# Patient Record
Sex: Male | Born: 1950
Health system: Southern US, Community
[De-identification: ages and names within clinical notes are randomized; demographics above are authoritative.]

## PROBLEM LIST (undated history)

## (undated) DIAGNOSIS — F419 Anxiety disorder, unspecified: Secondary | ICD-10-CM

## (undated) DIAGNOSIS — C801 Malignant (primary) neoplasm, unspecified: Secondary | ICD-10-CM

## (undated) DIAGNOSIS — M1990 Unspecified osteoarthritis, unspecified site: Secondary | ICD-10-CM

## (undated) DIAGNOSIS — F329 Major depressive disorder, single episode, unspecified: Secondary | ICD-10-CM

## (undated) DIAGNOSIS — J342 Deviated nasal septum: Secondary | ICD-10-CM

## (undated) DIAGNOSIS — I10 Essential (primary) hypertension: Secondary | ICD-10-CM

## (undated) DIAGNOSIS — F32A Depression, unspecified: Secondary | ICD-10-CM

## (undated) DIAGNOSIS — J343 Hypertrophy of nasal turbinates: Secondary | ICD-10-CM

## (undated) HISTORY — DX: Essential (primary) hypertension: I10

## (undated) HISTORY — DX: Unspecified osteoarthritis, unspecified site: M19.90

## (undated) HISTORY — PX: PROSTATE SURGERY: SHX751

## (undated) HISTORY — PX: INGUINAL HERNIA REPAIR: SHX194

## (undated) HISTORY — DX: Depression, unspecified: F32.A

## (undated) HISTORY — DX: Major depressive disorder, single episode, unspecified: F32.9

## (undated) HISTORY — DX: Malignant (primary) neoplasm, unspecified: C80.1

## (undated) HISTORY — DX: Anxiety disorder, unspecified: F41.9

---

## 1997-07-20 ENCOUNTER — Ambulatory Visit (HOSPITAL_COMMUNITY): Admission: RE | Admit: 1997-07-20 | Discharge: 1997-07-20 | Payer: Self-pay

## 1997-07-21 ENCOUNTER — Encounter: Admission: RE | Admit: 1997-07-21 | Discharge: 1997-07-21 | Payer: Self-pay | Admitting: *Deleted

## 1998-02-10 HISTORY — PX: COLONOSCOPY: SHX174

## 1998-12-21 ENCOUNTER — Ambulatory Visit (HOSPITAL_COMMUNITY): Admission: RE | Admit: 1998-12-21 | Discharge: 1998-12-21 | Payer: Self-pay | Admitting: *Deleted

## 2009-02-10 DIAGNOSIS — C801 Malignant (primary) neoplasm, unspecified: Secondary | ICD-10-CM

## 2009-02-10 HISTORY — DX: Malignant (primary) neoplasm, unspecified: C80.1

## 2009-11-14 ENCOUNTER — Encounter (INDEPENDENT_AMBULATORY_CARE_PROVIDER_SITE_OTHER): Payer: Self-pay | Admitting: Urology

## 2009-11-14 ENCOUNTER — Inpatient Hospital Stay (HOSPITAL_COMMUNITY): Admission: RE | Admit: 2009-11-14 | Discharge: 2009-11-16 | Payer: Self-pay | Admitting: Urology

## 2010-04-25 LAB — CBC
HCT: 41.1 % (ref 39.0–52.0)
Hemoglobin: 14 g/dL (ref 13.0–17.0)
MCH: 30.7 pg (ref 26.0–34.0)
MCH: 30.5 pg (ref 26.0–34.0)
MCHC: 34.2 g/dL (ref 30.0–36.0)
MCHC: 34 g/dL (ref 30.0–36.0)
MCV: 89.8 fL (ref 78.0–100.0)
Platelets: 178 10*3/uL (ref 150–400)
Platelets: 250 10*3/uL (ref 150–400)
Platelets: 233 K/uL (ref 150–400)
RBC: 3.52 MIL/uL — ABNORMAL LOW (ref 4.22–5.81)
RBC: 4.51 MIL/uL (ref 4.22–5.81)
RBC: 4.57 MIL/uL (ref 4.22–5.81)
RDW: 15.2 % (ref 11.5–15.5)
WBC: 10.3 10*3/uL (ref 4.0–10.5)
WBC: 4.8 K/uL (ref 4.0–10.5)

## 2010-04-25 LAB — BASIC METABOLIC PANEL
BUN: 17 mg/dL (ref 6–23)
CO2: 27 mEq/L (ref 19–32)
CO2: 29 mEq/L (ref 19–32)
Calcium: 7.6 mg/dL — ABNORMAL LOW (ref 8.4–10.5)
Calcium: 7.7 mg/dL — ABNORMAL LOW (ref 8.4–10.5)
Calcium: 8.7 mg/dL (ref 8.4–10.5)
Calcium: 9.5 mg/dL (ref 8.4–10.5)
Chloride: 106 mEq/L (ref 96–112)
Chloride: 99 mEq/L (ref 96–112)
Creatinine, Ser: 1.75 mg/dL — ABNORMAL HIGH (ref 0.4–1.5)
Creatinine, Ser: 2.4 mg/dL — ABNORMAL HIGH (ref 0.4–1.5)
Creatinine, Ser: 1.52 mg/dL — ABNORMAL HIGH (ref 0.4–1.5)
GFR calc Af Amer: 33 mL/min — ABNORMAL LOW (ref >60)
GFR calc Af Amer: 34 mL/min — ABNORMAL LOW (ref >60)
GFR calc Af Amer: 48 mL/min — ABNORMAL LOW (ref >60)
GFR calc non Af Amer: 31 mL/min — ABNORMAL LOW (ref >60)
GFR calc non Af Amer: 40 mL/min — ABNORMAL LOW (ref >60)
GFR calc non Af Amer: 47 mL/min — ABNORMAL LOW (ref >60)
Glucose, Bld: 94 mg/dL (ref 70–99)
Potassium: 4.6 mEq/L (ref 3.5–5.1)
Sodium: 136 mEq/L (ref 135–145)
Sodium: 139 mEq/L (ref 135–145)
Sodium: 142 mEq/L (ref 135–145)

## 2010-04-25 LAB — CLOSTRIDIUM DIFFICILE BY PCR: Toxigenic C. Difficile by PCR: NOT DETECTED

## 2010-04-25 LAB — DIFFERENTIAL
Lymphocytes Relative: 5 % — ABNORMAL LOW (ref 12–46)
Lymphs Abs: 0.5 10*3/uL — ABNORMAL LOW (ref 0.7–4.0)
Neutrophils Relative %: 93 % — ABNORMAL HIGH (ref 43–77)

## 2010-04-25 LAB — TYPE AND SCREEN
ABO/RH(D): O POS
Antibody Screen: NEGATIVE

## 2010-04-25 LAB — HEMOGLOBIN AND HEMATOCRIT, BLOOD
HCT: 27.6 % — ABNORMAL LOW (ref 39.0–52.0)
HCT: 30.3 % — ABNORMAL LOW (ref 39.0–52.0)
Hemoglobin: 9.4 g/dL — ABNORMAL LOW (ref 13.0–17.0)

## 2010-04-25 LAB — SURGICAL PCR SCREEN
MRSA, PCR: NEGATIVE
Staphylococcus aureus: NEGATIVE

## 2011-06-14 ENCOUNTER — Other Ambulatory Visit: Payer: Self-pay | Admitting: Family Medicine

## 2011-10-27 ENCOUNTER — Ambulatory Visit (INDEPENDENT_AMBULATORY_CARE_PROVIDER_SITE_OTHER): Payer: 59 | Admitting: Physician Assistant

## 2011-10-27 VITALS — BP 124/82 | HR 97 | Temp 98.9°F | Resp 18 | Ht 72.5 in | Wt 250.0 lb

## 2011-10-27 DIAGNOSIS — F329 Major depressive disorder, single episode, unspecified: Secondary | ICD-10-CM

## 2011-10-27 DIAGNOSIS — F32A Depression, unspecified: Secondary | ICD-10-CM

## 2011-10-27 DIAGNOSIS — N529 Male erectile dysfunction, unspecified: Secondary | ICD-10-CM

## 2011-10-27 DIAGNOSIS — I10 Essential (primary) hypertension: Secondary | ICD-10-CM

## 2011-10-27 DIAGNOSIS — F3289 Other specified depressive episodes: Secondary | ICD-10-CM

## 2011-10-27 MED ORDER — LISINOPRIL 20 MG PO TABS: 20.0000 mg | ORAL_TABLET | Freq: Every day | ORAL | Status: DC

## 2011-10-27 MED ORDER — TADALAFIL 20 MG PO TABS: 20.0000 mg | ORAL_TABLET | Freq: Every day | ORAL | Status: DC | PRN

## 2011-10-27 MED ORDER — SERTRALINE HCL 100 MG PO TABS: 100.0000 mg | ORAL_TABLET | Freq: Every day | ORAL | Status: DC

## 2011-10-27 MED ORDER — METOPROLOL SUCCINATE ER 50 MG PO TB24: 50.0000 mg | ORAL_TABLET | Freq: Every day | ORAL | Status: DC

## 2011-10-27 NOTE — Progress Notes (Signed)
  Subjective:    Patient ID: Jordan Hines, male    DOB: 1950/11/19, 61 y.o.   MRN: [id number]  HPI 61 year old male presents for medication refill.  He  Normally sees Dr. Perrin Maltese for his CPE, and is due for one, but decided to come in today for medication refill.  He plans to make an appointment with Dr. Perrin Maltese at 104 in the next 1-2 months.  He does see his urologist Dr. Isabel Caprice q75months for follow up on his prostate cancer.  Denies chest pain, palpitations, headache, dizziness, or shortness of breath.  He has not taken his blood pressure medication in 4 weeks, and has been taking 1/2 of his zoloft.  Does admit that he needs this refilled.  He does drink a fifth of alcohol every 5 days.  Did start work part time and enjoys this.    Review of Systems  All other systems reviewed and are negative.       Objective:   Physical Exam  Constitutional: He is oriented to person, place, and time. He appears well-developed and well-nourished.  HENT:  Head: Normocephalic and atraumatic.  Right Ear: External ear normal.  Left Ear: External ear normal.  Eyes: Conjunctivae normal are normal.  Neck: Normal range of motion.  Cardiovascular: Normal rate, regular rhythm and normal heart sounds.   Pulmonary/Chest: Effort normal and breath sounds normal.  Neurological: He is alert and oriented to person, place, and time.  Psychiatric: He has a normal mood and affect. His behavior is normal. Judgment and thought content normal.          Assessment & Plan:   1. Hypertension   2. Erectile dysfunction   3. Depression    Will await labwork until CPE with Dr. Perrin Maltese in next 1-2 months 90 day supply of all medications sent to [facility] with 1 month supply sent to local [facility] Encouraged him to get daily aerobic exercises. Also he needs to decrease alcohol intake to maximum of 1-2 per night

## 2011-10-28 ENCOUNTER — Telehealth: Payer: Self-pay

## 2011-10-28 NOTE — Telephone Encounter (Signed)
His Rx were sent to [facility] for him yesterday, not [facility]. Have left a message for him to call me back.

## 2011-10-28 NOTE — Telephone Encounter (Signed)
Pt in office 10-27-11 and was given rx that were sent to [facility], he states the rx are to much he cant afford them, so he would like to have all the rx sent to [facility] and states he will have to wait for them. [phone]

## 2011-12-28 IMAGING — CR DG CHEST 2V
2 series · 2 of 2 positions shown · non-contrast
Comparison: None.

CLINICAL DATA: Preoperative respiratory film for patient with
prostate cancer.

CHEST - 2 VIEW

[w chest pa]
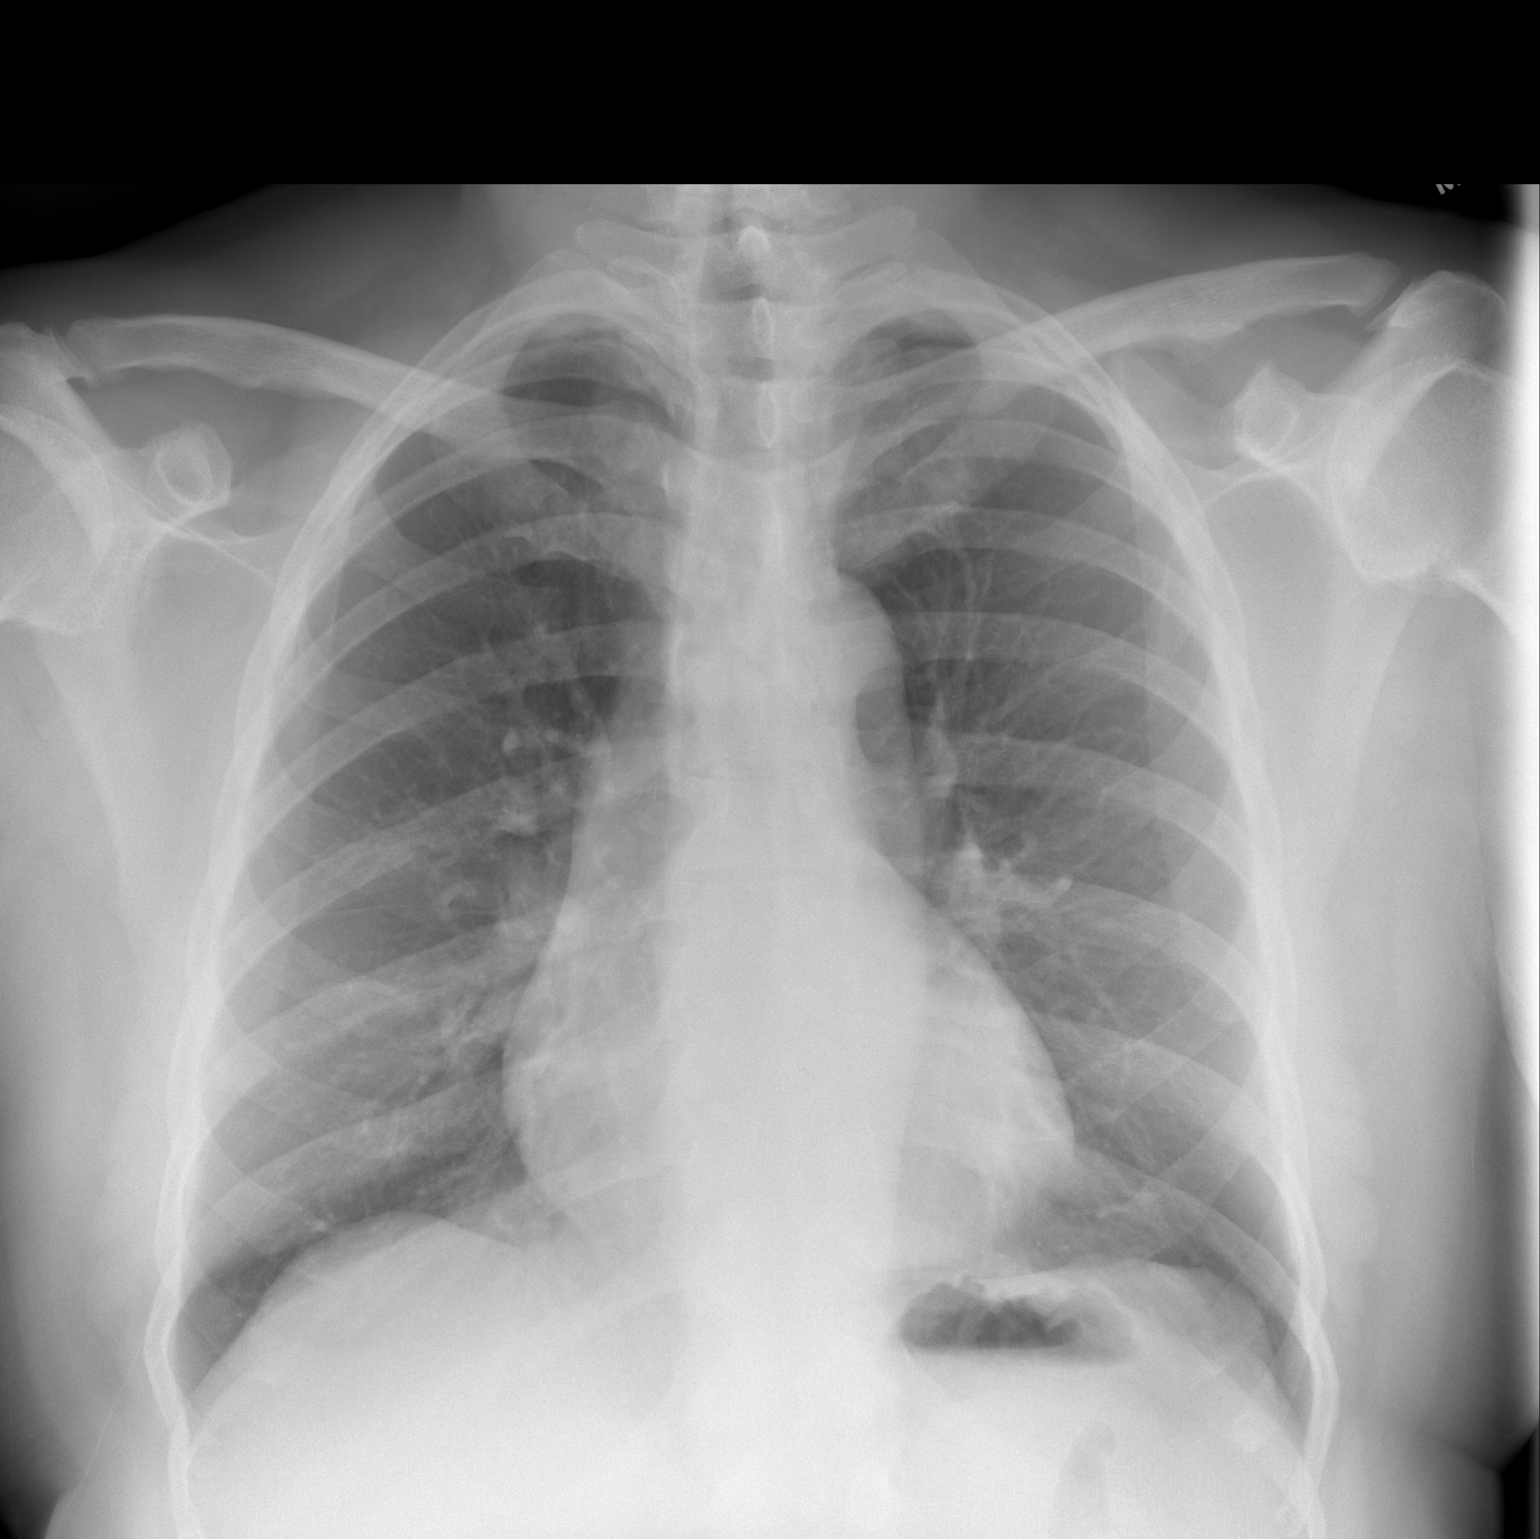

[w chest lat]
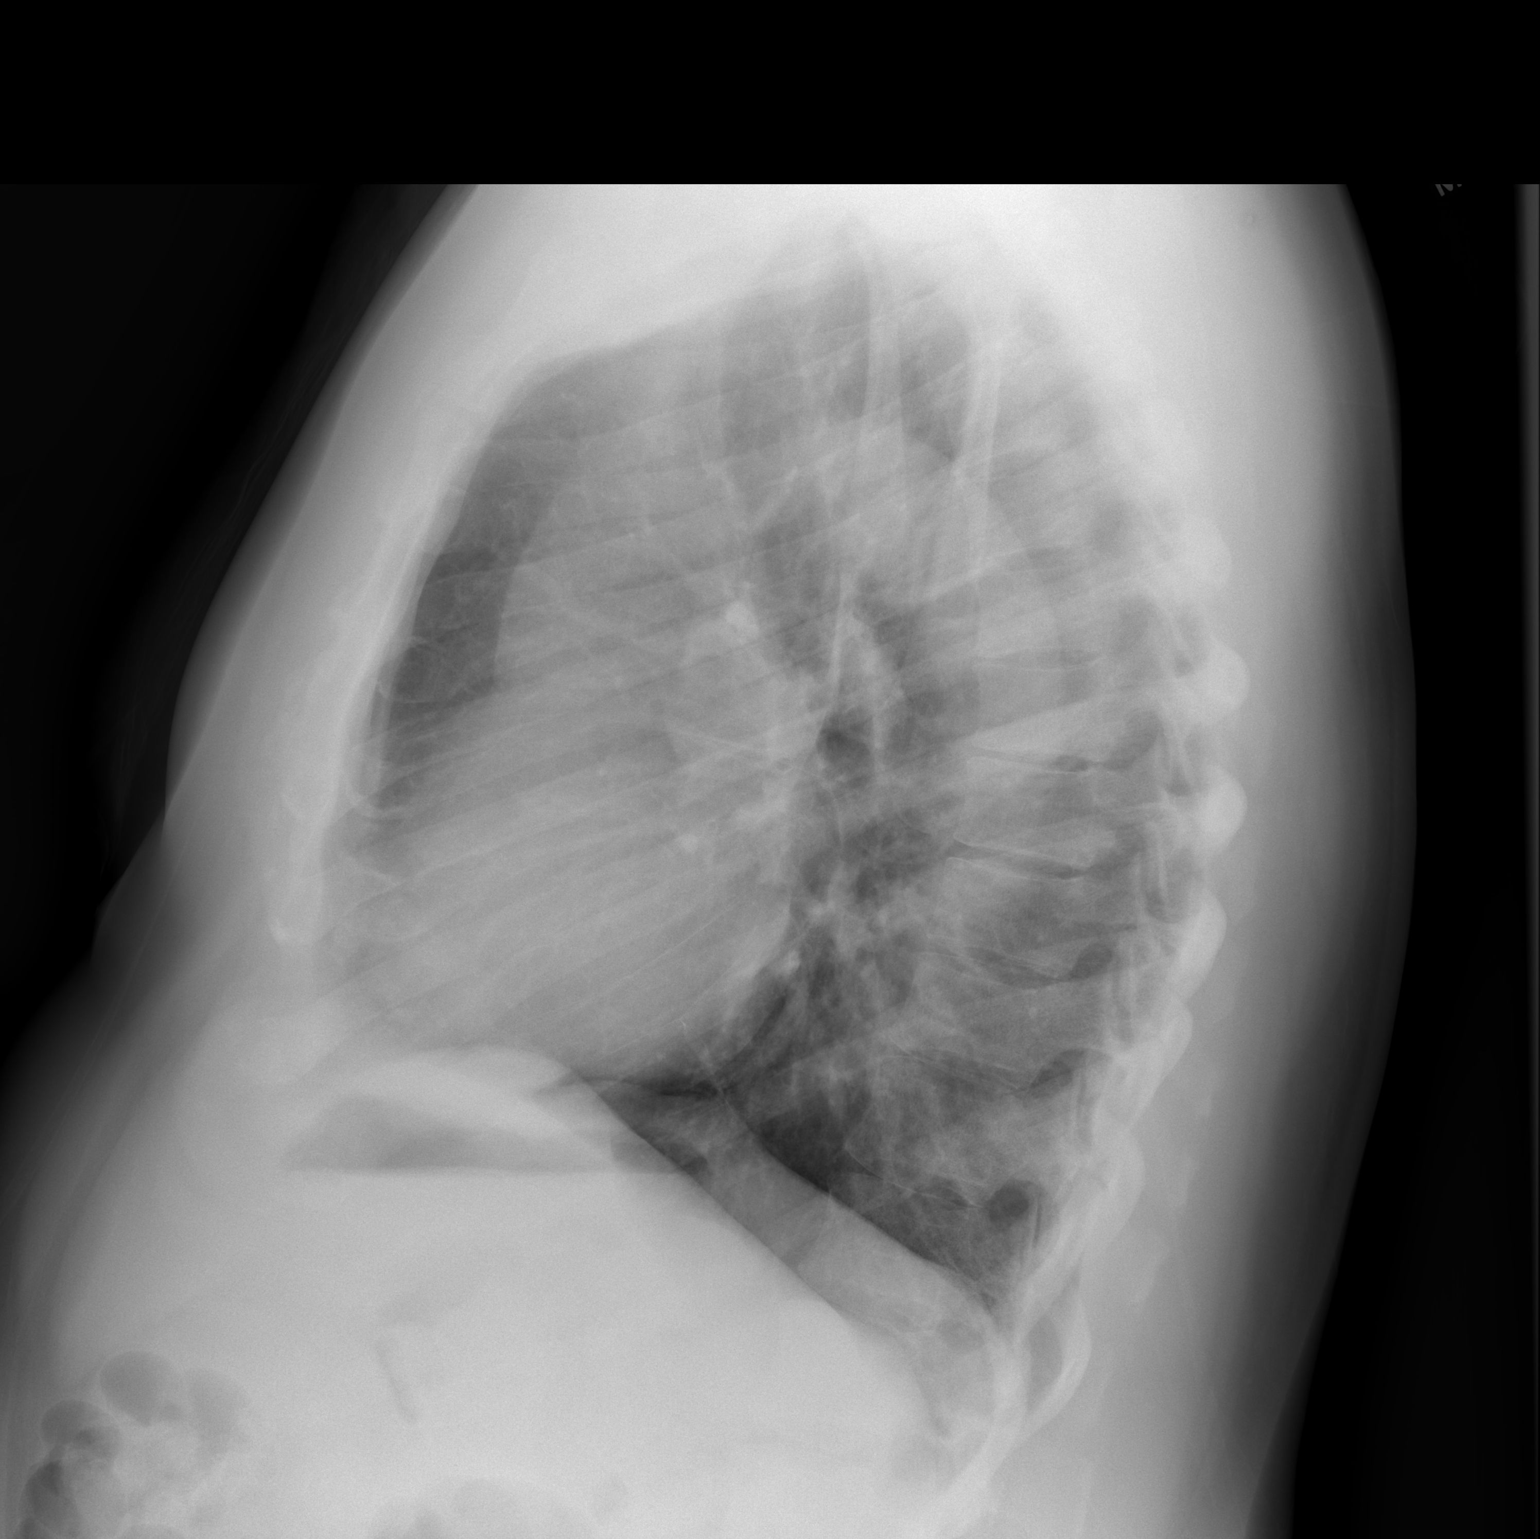

[2 of 2 positions shown; findings below may reference images not displayed]

FINDINGS: The lungs are clear.  There is no pneumothorax or pleural
effusion.  Heart size is normal.  No focal bony abnormality.
IMPRESSION: Normal chest.

## 2012-02-16 ENCOUNTER — Encounter: Payer: 59 | Admitting: Internal Medicine

## 2012-03-18 ENCOUNTER — Other Ambulatory Visit: Payer: Self-pay | Admitting: Physician Assistant

## 2012-07-04 ENCOUNTER — Ambulatory Visit: Payer: 59 | Admitting: Emergency Medicine

## 2012-07-04 VITALS — BP 158/82 | HR 83 | Temp 97.9°F | Resp 16 | Ht 72.75 in | Wt 245.0 lb

## 2012-07-04 DIAGNOSIS — N529 Male erectile dysfunction, unspecified: Secondary | ICD-10-CM

## 2012-07-04 DIAGNOSIS — F329 Major depressive disorder, single episode, unspecified: Secondary | ICD-10-CM

## 2012-07-04 DIAGNOSIS — I10 Essential (primary) hypertension: Secondary | ICD-10-CM

## 2012-07-04 LAB — COMPREHENSIVE METABOLIC PANEL
ALT: 20 U/L (ref 0–53)
AST: 25 U/L (ref 0–37)
Albumin: 4.1 g/dL (ref 3.5–5.2)
Alkaline Phosphatase: 77 U/L (ref 39–117)
BUN: 10 mg/dL (ref 6–23)
Calcium: 9.5 mg/dL (ref 8.4–10.5)
Chloride: 103 mEq/L (ref 96–112)
Potassium: 4 mEq/L (ref 3.5–5.3)
Sodium: 139 mEq/L (ref 135–145)
Total Protein: 7.1 g/dL (ref 6.0–8.3)

## 2012-07-04 LAB — LIPID PANEL
HDL: 57 mg/dL (ref >39)
LDL Cholesterol: 109 mg/dL — ABNORMAL HIGH (ref 0–99)

## 2012-07-04 MED ORDER — SERTRALINE HCL 100 MG PO TABS: 100.0000 mg | ORAL_TABLET | Freq: Every day | ORAL | Status: DC

## 2012-07-04 MED ORDER — LISINOPRIL 20 MG PO TABS: 20.0000 mg | ORAL_TABLET | Freq: Every day | ORAL | Status: DC

## 2012-07-04 MED ORDER — METOPROLOL SUCCINATE ER 50 MG PO TB24: 50.0000 mg | ORAL_TABLET | Freq: Every day | ORAL | Status: DC

## 2012-07-04 NOTE — Progress Notes (Signed)
[facility] [address] [place] [address] [phone]  Date:  07/04/2012   Name:  Jordan Hines   DOB:  05/30/1950   MRN:  [id number]  PCP:  No primary provider on file.    Chief Complaint: Hypertension, Bloated and Medication Refill   History of Present Illness:  Jordan Hines is a 62 y.o. very pleasant male patient who presents with the following:  Ran out of his antihypertensive a month ago.  Is asymptomatic.  Is fasting this morning.  Smoker.   No improvement with over the counter medications or other home remedies. Denies other complaint or health concern today.  His PSA is monitored by his urologist and he was recently prescribed caverject.  Is current on colonoscopy.  There are no active problems to display for this patient.   No past medical history on file.  No past surgical history on file.  History  Substance Use Topics  . Smoking status: Current Every Day Smoker    Types: Cigars  . Smokeless tobacco: Not on file  . Alcohol Use: 12.6 oz/week    21 Cans of beer per week    No family history on file.  No Known Allergies  Medication list has been reviewed and updated.  Current Outpatient Prescriptions on File Prior to Visit  Medication Sig Dispense Refill  . lisinopril (PRINIVIL,ZESTRIL) 20 MG tablet TAKE 1 TABLET DAILY  30 tablet  0  . metoprolol succinate (TOPROL-XL) 50 MG 24 hr tablet TAKE 1 TABLET DAILY  30 tablet  0  . sertraline (ZOLOFT) 100 MG tablet Take 1 tablet (100 mg total) by mouth daily.  90 tablet  3  . tadalafil (CIALIS) 20 MG tablet Take 1 tablet (20 mg total) by mouth daily as needed.  10 tablet  5   No current facility-administered medications on file prior to visit.    Review of Systems:  As per HPI, otherwise negative.    Physical Examination: Filed Vitals:   07/04/12 0848  BP: 158/82  Pulse: 83  Temp: 97.9 F (36.6 C)  Resp: 16   Filed Vitals:   07/04/12 0848  Height: 6' 0.75" (1.848 m)   Weight: 245 lb (111.131 kg)   Body mass index is 32.54 kg/(m^2). Ideal Body Weight: Weight in (lb) to have BMI = 25: 187.8  GEN: WDWN, NAD, Non-toxic, A & O x 3 HEENT: Atraumatic, Normocephalic. Neck supple. No masses, No LAD. Ears and Nose: No external deformity. CV: RRR, No M/G/R. No JVD. No thrill. No extra heart sounds. PULM: CTA B, no wheezes, crackles, rhonchi. No retractions. No resp. distress. No accessory muscle use. ABD: S, NT, ND, +BS. No rebound. No HSM. EXTR: No c/c/e NEURO Normal gait.  PSYCH: Normally interactive. Conversant. Not depressed or anxious appearing.  Calm demeanor.    Assessment and Plan: Hypertension ED Smokes Prostate cancer Follow up in one month for BP check  Signed,  Phillips Odor, MD

## 2012-07-04 NOTE — Progress Notes (Signed)
Reviewed and agree.

## 2013-05-06 ENCOUNTER — Ambulatory Visit (INDEPENDENT_AMBULATORY_CARE_PROVIDER_SITE_OTHER): Payer: 59 | Admitting: Emergency Medicine

## 2013-05-06 VITALS — BP 110/78 | HR 92 | Temp 97.6°F | Resp 16 | Ht 72.5 in | Wt 266.2 lb

## 2013-05-06 DIAGNOSIS — F3289 Other specified depressive episodes: Secondary | ICD-10-CM

## 2013-05-06 DIAGNOSIS — F32A Depression, unspecified: Secondary | ICD-10-CM

## 2013-05-06 DIAGNOSIS — I10 Essential (primary) hypertension: Secondary | ICD-10-CM

## 2013-05-06 DIAGNOSIS — N529 Male erectile dysfunction, unspecified: Secondary | ICD-10-CM

## 2013-05-06 DIAGNOSIS — F329 Major depressive disorder, single episode, unspecified: Secondary | ICD-10-CM

## 2013-05-06 MED ORDER — SERTRALINE HCL 100 MG PO TABS: 100.0000 mg | ORAL_TABLET | Freq: Every day | ORAL | Status: DC

## 2013-05-06 NOTE — Progress Notes (Signed)
[facility] [address] [place] [address] [phone]  Date:  05/06/2013   Name:  Jordan Hines   DOB:  1950-08-21   MRN:  [id number]  PCP:  No primary provider on file.    Chief Complaint: Medication Refill   History of Present Illness:  Jordan Hines is a 63 y.o. very pleasant male patient who presents with the following:  History of hypertension not compliant with medications.  Has been out of meds for three months.  16 pound weight gain in past 9 months.  Denies other complaint or health concern today.   There are no active problems to display for this patient.   History reviewed. No pertinent past medical history.  History reviewed. No pertinent past surgical history.  History  Substance Use Topics  . Smoking status: Current Every Day Smoker    Types: Cigars  . Smokeless tobacco: Not on file  . Alcohol Use: 12.6 oz/week    21 Cans of beer per week    History reviewed. No pertinent family history.  No Known Allergies  Medication list has been reviewed and updated.  Current Outpatient Prescriptions on File Prior to Visit  Medication Sig Dispense Refill  . alprostadil (EDEX) 20 MCG injection 20 mcg by Intracavitary route as needed for erectile dysfunction. use no more than 3 times per week      . lisinopril (PRINIVIL,ZESTRIL) 20 MG tablet Take 1 tablet (20 mg total) by mouth daily.  30 tablet  5  . metoprolol succinate (TOPROL-XL) 50 MG 24 hr tablet Take 1 tablet (50 mg total) by mouth daily. Take with or immediately following a meal.  30 tablet  5  . sertraline (ZOLOFT) 100 MG tablet Take 1 tablet (100 mg total) by mouth daily.  90 tablet  3   No current facility-administered medications on file prior to visit.    Review of Systems:  As per HPI, otherwise negative.    Physical Examination: Filed Vitals:   05/06/13 1658  BP: 110/78  Pulse: 92  Temp: 97.6 F (36.4 C)  Resp: 16   Filed Vitals:   05/06/13 1658  Height: 6'  0.5" (1.842 m)  Weight: 266 lb 3.2 oz (120.748 kg)   Body mass index is 35.59 kg/(m^2). Ideal Body Weight: Weight in (lb) to have BMI = 25: 186.5  GEN: WDWN, NAD, Non-toxic, A & O x 3 HEENT: Atraumatic, Normocephalic. Neck supple. No masses, No LAD. Ears and Nose: No external deformity. CV: RRR, No M/G/R. No JVD. No thrill. No extra heart sounds. PULM: CTA B, no wheezes, crackles, rhonchi. No retractions. No resp. distress. No accessory muscle use. ABD: S, NT, ND, +BS. No rebound. No HSM. EXTR: No c/c/e NEURO Normal gait.  PSYCH: Normally interactive. Conversant. Not depressed or anxious appearing.  Calm demeanor.    Assessment and Plan: Hypertension Obesity Labs  Signed,  Ellison Carwin, MD

## 2013-05-06 NOTE — Patient Instructions (Signed)

## 2013-05-11 ENCOUNTER — Other Ambulatory Visit (INDEPENDENT_AMBULATORY_CARE_PROVIDER_SITE_OTHER): Payer: 59 | Admitting: *Deleted

## 2013-05-11 DIAGNOSIS — F329 Major depressive disorder, single episode, unspecified: Secondary | ICD-10-CM

## 2013-05-11 DIAGNOSIS — I10 Essential (primary) hypertension: Secondary | ICD-10-CM

## 2013-05-11 DIAGNOSIS — N529 Male erectile dysfunction, unspecified: Secondary | ICD-10-CM

## 2013-05-11 DIAGNOSIS — F3289 Other specified depressive episodes: Secondary | ICD-10-CM

## 2013-05-11 DIAGNOSIS — F32A Depression, unspecified: Secondary | ICD-10-CM

## 2013-05-12 LAB — CBC WITH DIFFERENTIAL/PLATELET
BASOS ABS: 0 10*3/uL (ref 0.0–0.1)
Basophils Relative: 1 % (ref 0–1)
Eosinophils Absolute: 0.1 10*3/uL (ref 0.0–0.7)
Eosinophils Relative: 3 % (ref 0–5)
HCT: 42.8 % (ref 39.0–52.0)
HEMOGLOBIN: 14.3 g/dL (ref 13.0–17.0)
LYMPHS ABS: 1.4 10*3/uL (ref 0.7–4.0)
LYMPHS PCT: 39 % (ref 12–46)
MCH: 28.7 pg (ref 26.0–34.0)
MCHC: 33.4 g/dL (ref 30.0–36.0)
MCV: 85.9 fL (ref 78.0–100.0)
MONO ABS: 0.4 10*3/uL (ref 0.1–1.0)
Monocytes Relative: 10 % (ref 3–12)
NEUTROS ABS: 1.7 10*3/uL (ref 1.7–7.7)
Neutrophils Relative %: 47 % (ref 43–77)
Platelets: 226 10*3/uL (ref 150–400)
RBC: 4.98 MIL/uL (ref 4.22–5.81)
RDW: 14.3 % (ref 11.5–15.5)
WBC: 3.7 10*3/uL — ABNORMAL LOW (ref 4.0–10.5)

## 2013-05-12 LAB — LIPID PANEL
CHOL/HDL RATIO: 3 ratio
CHOLESTEROL: 178 mg/dL (ref 0–200)
HDL: 60 mg/dL (ref >39)
LDL Cholesterol: 92 mg/dL (ref 0–99)
TRIGLYCERIDES: 128 mg/dL (ref <150)
VLDL: 26 mg/dL (ref 0–40)

## 2013-05-12 LAB — COMPREHENSIVE METABOLIC PANEL
ALT: 21 U/L (ref 0–53)
AST: 20 U/L (ref 0–37)
Albumin: 4 g/dL (ref 3.5–5.2)
Alkaline Phosphatase: 93 U/L (ref 39–117)
BILIRUBIN TOTAL: 0.9 mg/dL (ref 0.2–1.2)
BUN: 8 mg/dL (ref 6–23)
CALCIUM: 9.2 mg/dL (ref 8.4–10.5)
CO2: 28 meq/L (ref 19–32)
Chloride: 102 mEq/L (ref 96–112)
Creat: 1.05 mg/dL (ref 0.50–1.35)
GLUCOSE: 92 mg/dL (ref 70–99)
Potassium: 4.2 mEq/L (ref 3.5–5.3)
SODIUM: 138 meq/L (ref 135–145)
TOTAL PROTEIN: 6.9 g/dL (ref 6.0–8.3)

## 2013-05-12 LAB — TSH: TSH: 1.112 u[IU]/mL (ref 0.350–4.500)

## 2013-05-13 LAB — PSA: PSA: 0.19 ng/mL (ref <=4.00)

## 2013-12-02 ENCOUNTER — Ambulatory Visit (INDEPENDENT_AMBULATORY_CARE_PROVIDER_SITE_OTHER): Payer: 59 | Admitting: Family Medicine

## 2013-12-02 VITALS — BP 150/98 | HR 89 | Temp 98.1°F | Resp 18 | Ht 71.0 in | Wt 259.0 lb

## 2013-12-02 DIAGNOSIS — C61 Malignant neoplasm of prostate: Secondary | ICD-10-CM

## 2013-12-02 DIAGNOSIS — F32A Depression, unspecified: Secondary | ICD-10-CM

## 2013-12-02 DIAGNOSIS — M1712 Unilateral primary osteoarthritis, left knee: Secondary | ICD-10-CM

## 2013-12-02 DIAGNOSIS — M179 Osteoarthritis of knee, unspecified: Secondary | ICD-10-CM

## 2013-12-02 DIAGNOSIS — F329 Major depressive disorder, single episode, unspecified: Secondary | ICD-10-CM

## 2013-12-02 DIAGNOSIS — M1711 Unilateral primary osteoarthritis, right knee: Secondary | ICD-10-CM

## 2013-12-02 DIAGNOSIS — M545 Low back pain: Secondary | ICD-10-CM

## 2013-12-02 DIAGNOSIS — I10 Essential (primary) hypertension: Secondary | ICD-10-CM

## 2013-12-02 MED ORDER — SERTRALINE HCL 100 MG PO TABS: 100.0000 mg | ORAL_TABLET | Freq: Every day | ORAL | Status: DC

## 2013-12-02 MED ORDER — METOPROLOL SUCCINATE ER 50 MG PO TB24
50.0000 mg | ORAL_TABLET | Freq: Every day | ORAL | Status: DC
Start: 2013-12-02 — End: 2014-05-09

## 2013-12-02 MED ORDER — LISINOPRIL 20 MG PO TABS: 20.0000 mg | ORAL_TABLET | Freq: Every day | ORAL | Status: DC

## 2013-12-02 MED ORDER — METOPROLOL SUCCINATE ER 50 MG PO TB24: 50.0000 mg | ORAL_TABLET | Freq: Every day | ORAL | Status: DC

## 2013-12-02 NOTE — Progress Notes (Signed)
Subjective:  This chart was scribed for Robyn Haber, MD by Donato Schultz, Medical Scribe. This patient was seen in Room 8 and the patient's care was started at 10:21 AM.   Patient ID: Jordan Hines, male    DOB: October 01, 1950, 63 y.o.   MRN: [id number]  HPI HPI Comments: Jordan Hines is a 63 y.o. male who presents to the [facility] complaining of fatigue that started after 4 months ago he was taken off of his hypertension medication.  He felt much improved when taking his medication.  He would also like a refill on his Zoloft prescription.  Dr. Jeralyn Ruths follows his remission from prostate cancer.    He is also complaining of constant bilateral knee stiffness that is aggravated when she stands after sitting for long periods of time.  Seldomly he feels like his knees are going to give out.  He has applied icy hot to his knees with temporary relief to his symptoms.  He started taking glucosamine yesterday.  He suspects that his symptoms are arthritic in nature.  He works at a desk job.    There are no active problems to display for this patient.  Past Medical History  Diagnosis Date  . Hypertension    History reviewed. No pertinent past surgical history. No Known Allergies Prior to Admission medications   Medication Sig Start Date End Date Taking? Authorizing Provider  alprostadil (EDEX) 20 MCG injection 20 mcg by Intracavitary route as needed for erectile dysfunction. use no more than 3 times per week   Yes Historical Provider, MD  sertraline (ZOLOFT) 100 MG tablet Take 1 tablet (100 mg total) by mouth daily. 05/06/13  Yes Roselee Culver, MD  lisinopril (PRINIVIL,ZESTRIL) 20 MG tablet Take 1 tablet (20 mg total) by mouth daily. 07/04/12   Roselee Culver, MD  metoprolol succinate (TOPROL-XL) 50 MG 24 hr tablet Take 1 tablet (50 mg total) by mouth daily. Take with or immediately following a meal. 07/04/12   Roselee Culver, MD   History   Social History  .  Marital Status: Married    Spouse Name: N/A    Number of Children: N/A  . Years of Education: N/A   Occupational History  . Not on file.   Social History Main Topics  . Smoking status: Current Every Day Smoker    Types: Cigars  . Smokeless tobacco: Not on file  . Alcohol Use: 12.6 oz/week    21 Cans of beer per week  . Drug Use: Not on file  . Sexual Activity: Yes   Other Topics Concern  . Not on file   Social History Narrative  . No narrative on file      Review of Systems  Constitutional: Positive for fatigue.     Objective:  Physical Exam  Nursing note and vitals reviewed. Constitutional: He is oriented to person, place, and time. He appears well-developed and well-nourished.  HENT:  Head: Normocephalic and atraumatic.  Eyes: EOM are normal.  Neck: Normal range of motion. Neck supple. Carotid bruit is not present.  Cardiovascular: Normal rate, regular rhythm and normal heart sounds.  Exam reveals no gallop.   No murmur heard. Pulmonary/Chest: Effort normal and breath sounds normal. No respiratory distress. He has no wheezes. He has no rales.  Musculoskeletal: Normal range of motion. He exhibits no edema.  Knees show mild senoial thickening bilaterally.   Lymphadenopathy:    He has no cervical adenopathy.  Neurological: He is alert  and oriented to person, place, and time.  Skin: Skin is warm and dry.  Psychiatric: He has a normal mood and affect. His behavior is normal.     BP 150/98  Pulse 89  Temp(Src) 98.1 F (36.7 C) (Oral)  Resp 18  Ht 5\' 11"  (1.803 m)  Wt 259 lb (117.482 kg)  BMI 36.14 kg/m2  SpO2 98% Assessment & Plan:  I personally performed the services described in this documentation, which was scribed in my presence. The recorded information has been reviewed and is accurate.  Depression - Plan: sertraline (ZOLOFT) 100 MG tablet, DISCONTINUED: sertraline (ZOLOFT) 100 MG tablet  Essential hypertension - Plan: metoprolol succinate  (TOPROL-XL) 50 MG 24 hr tablet, lisinopril (PRINIVIL,ZESTRIL) 20 MG tablet, DISCONTINUED: lisinopril (PRINIVIL,ZESTRIL) 20 MG tablet, DISCONTINUED: metoprolol succinate (TOPROL-XL) 50 MG 24 hr tablet  Osteoarthritis of left knee, unspecified osteoarthritis type  Prostate cancer  Low back pain, unspecified back pain laterality, with sciatica presence unspecified  Primary osteoarthritis of right knee  Signed, Robyn Haber, MD

## 2014-03-09 ENCOUNTER — Telehealth: Payer: Self-pay

## 2014-03-09 DIAGNOSIS — C61 Malignant neoplasm of prostate: Secondary | ICD-10-CM | POA: Diagnosis not present

## 2014-03-09 NOTE — Telephone Encounter (Signed)
Patient needs referral to urology

## 2014-03-09 NOTE — Telephone Encounter (Signed)
Pt needs to be evaluated prior to having a referral completed.  LM to advise pt.

## 2014-05-09 ENCOUNTER — Ambulatory Visit (INDEPENDENT_AMBULATORY_CARE_PROVIDER_SITE_OTHER): Payer: Medicare HMO | Admitting: Family Medicine

## 2014-05-09 VITALS — BP 130/80 | HR 89 | Temp 98.0°F | Ht 72.0 in | Wt 264.2 lb

## 2014-05-09 DIAGNOSIS — F32A Depression, unspecified: Secondary | ICD-10-CM

## 2014-05-09 DIAGNOSIS — I10 Essential (primary) hypertension: Secondary | ICD-10-CM

## 2014-05-09 DIAGNOSIS — R300 Dysuria: Secondary | ICD-10-CM

## 2014-05-09 DIAGNOSIS — Z8546 Personal history of malignant neoplasm of prostate: Secondary | ICD-10-CM | POA: Diagnosis not present

## 2014-05-09 DIAGNOSIS — F329 Major depressive disorder, single episode, unspecified: Secondary | ICD-10-CM | POA: Diagnosis not present

## 2014-05-09 LAB — POCT URINALYSIS DIPSTICK
Bilirubin, UA: NEGATIVE
GLUCOSE UA: NEGATIVE
KETONES UA: NEGATIVE
Nitrite, UA: NEGATIVE
Protein, UA: NEGATIVE
Spec Grav, UA: 1.015
Urobilinogen, UA: 1
pH, UA: 7

## 2014-05-09 LAB — POCT UA - MICROSCOPIC ONLY
CASTS, UR, LPF, POC: NEGATIVE
Crystals, Ur, HPF, POC: NEGATIVE
MUCUS UA: NEGATIVE
YEAST UA: NEGATIVE

## 2014-05-09 MED ORDER — SERTRALINE HCL 100 MG PO TABS: 100.0000 mg | ORAL_TABLET | Freq: Every day | ORAL | Status: DC

## 2014-05-09 MED ORDER — CIPROFLOXACIN HCL 500 MG PO TABS: 500.0000 mg | ORAL_TABLET | Freq: Two times a day (BID) | ORAL | Status: DC

## 2014-05-09 MED ORDER — METOPROLOL SUCCINATE ER 50 MG PO TB24: 50.0000 mg | ORAL_TABLET | Freq: Every day | ORAL | Status: DC

## 2014-05-09 MED ORDER — LISINOPRIL 20 MG PO TABS: 20.0000 mg | ORAL_TABLET | Freq: Every day | ORAL | Status: DC

## 2014-05-09 NOTE — Progress Notes (Signed)
Subjective: 64 year old man who is here for a checkup. He thinks he has urine infection. Has had dysuria for the last week though it's almost well now. He's been drinking a lot of water. He still has a little dribbling. He has a history of prostate cancer and a prostatectomy several years ago. He continues to be followed by urologist for that, but since going on Medicare and he was a referral back for his regular checkup to the urologist.  History of hypertension, with it up and down at times. Last time he was told that he didn't need any blood pressure medicine, that his pressure was good. He then had a very high blood pressure a few days later and was put back on his medicine. However he has been off it for 5 days now and has a good reading.  Review of systems fairly unremarkable otherwise. He still works a couple of days week. His depression is doing well on his medication.  Objective: No acute distress. Eyes PERRLA. Throat clear. Neck supple without nodes. Chest clear to auscultation. Heart regular without murmurs. Abdomen soft without mass or tenderness. Extremities without edema.  Assessment: Hypertension History of prostate cancer Possible urinary tract infection/dysuria History of depression, stable  Plan: Screening labs Urinalysis  Results for orders placed or performed in visit on 05/09/14  POCT urinalysis dipstick  Result Value Ref Range   Color, UA yellow    Clarity, UA clear    Glucose, UA neg    Bilirubin, UA neg    Ketones, UA neg    Spec Grav, UA 1.015    Blood, UA small    pH, UA 7.0    Protein, UA neg    Urobilinogen, UA 1.0    Nitrite, UA neg    Leukocytes, UA Trace   POCT UA - Microscopic Only  Result Value Ref Range   WBC, Ur, HPF, POC 1-3    RBC, urine, microscopic 0-1    Bacteria, U Microscopic trace    Mucus, UA neg    Epithelial cells, urine per micros 0-1    Crystals, Ur, HPF, POC neg    Casts, Ur, LPF, POC neg    Yeast, UA neg    Not a definite  UTI, but it could be a resolving 1. Will treat for 3 days while the culture is pending since he does have some leukocytes on the dip also.  Put him back on his blood pressure medications, but only take metipranolol initially. See if he really needs a second one or not.  Return this summer. We'll let him know the results of his labs.

## 2014-05-09 NOTE — Patient Instructions (Addendum)
Continue to drink plenty of fluids  Work on eating and drinking beverages less as we discussed so you can try and lose weight  Try to get daily exercise such as a walk  Referral is being made back to your urologist for you.  Take the metoprolol blood pressure pill 1 daily.  Go to the pharmacy and get your blood pressure checked several times. If it is good you do not need to take the lisinopril. If it is elevated go back on the lisinopril also.  Return at anytime if worse.  I will let you know the results of your labs.  Advise that you schedule yourself a WELCOME TO MEDICARE physical with a primary doctor.

## 2014-05-10 LAB — COMPREHENSIVE METABOLIC PANEL
ALK PHOS: 78 U/L (ref 39–117)
ALT: 20 U/L (ref 0–53)
AST: 20 U/L (ref 0–37)
Albumin: 3.9 g/dL (ref 3.5–5.2)
BUN: 14 mg/dL (ref 6–23)
CO2: 28 mEq/L (ref 19–32)
CREATININE: 0.95 mg/dL (ref 0.50–1.35)
Calcium: 9.1 mg/dL (ref 8.4–10.5)
Chloride: 102 mEq/L (ref 96–112)
Glucose, Bld: 103 mg/dL — ABNORMAL HIGH (ref 70–99)
POTASSIUM: 4.3 meq/L (ref 3.5–5.3)
SODIUM: 140 meq/L (ref 135–145)
TOTAL PROTEIN: 6.8 g/dL (ref 6.0–8.3)
Total Bilirubin: 0.6 mg/dL (ref 0.2–1.2)

## 2014-05-10 LAB — LIPID PANEL
Cholesterol: 161 mg/dL (ref 0–200)
HDL: 60 mg/dL (ref >=40)
LDL Cholesterol: 61 mg/dL (ref 0–99)
Total CHOL/HDL Ratio: 2.7 Ratio
Triglycerides: 198 mg/dL — ABNORMAL HIGH (ref <150)
VLDL: 40 mg/dL (ref 0–40)

## 2014-05-11 ENCOUNTER — Telehealth: Payer: Self-pay

## 2014-05-11 LAB — URINE CULTURE: Colony Count: 75000

## 2014-05-11 NOTE — Telephone Encounter (Signed)
Pt called about labs. Notified.

## 2014-07-17 ENCOUNTER — Encounter: Payer: Self-pay | Admitting: Family Medicine

## 2014-07-17 ENCOUNTER — Ambulatory Visit (INDEPENDENT_AMBULATORY_CARE_PROVIDER_SITE_OTHER): Payer: Commercial Managed Care - HMO | Admitting: Family Medicine

## 2014-07-17 VITALS — BP 118/72 | HR 70 | Temp 98.3°F | Resp 16 | Ht 71.5 in | Wt 256.6 lb

## 2014-07-17 DIAGNOSIS — R42 Dizziness and giddiness: Secondary | ICD-10-CM | POA: Diagnosis not present

## 2014-07-17 DIAGNOSIS — I10 Essential (primary) hypertension: Secondary | ICD-10-CM | POA: Diagnosis not present

## 2014-07-17 DIAGNOSIS — R5383 Other fatigue: Secondary | ICD-10-CM

## 2014-07-17 MED ORDER — LISINOPRIL 10 MG PO TABS: 10.0000 mg | ORAL_TABLET | Freq: Every day | ORAL | Status: DC

## 2014-07-17 NOTE — Progress Notes (Signed)
Subjective:  This chart was scribed for Merri Ray, MD by [facility], medical scribe at [facility].The patient was seen in exam room 49 and the patient's care was started at 4:36 PM.   Patient ID: Jordan Hines, male    DOB: 19-Jan-1951, 64 y.o.   MRN: [id number] Chief Complaint  Patient presents with  . Establish Care  . Hypertension   HPI HPI Comments: Jordan Hines is a 64 y.o. male who presents to [facility] to establish care and discuss hypertension. He is a previous patient of Dr. Elder Cyphers and a new patient to me. He has seen other providers in our office, most recently Dr. Linna Darner on 05/09/2014.  Hypertension: Borderline readings 130/80 at last visit when he had been off of medication for five days. He had lisinopril and metoprolol refilled.  BP off the lisinopril was 355-732 systolic. He is currently taking both. Occasionally becomes lightheaded or dizzy with activity. He also complains of fatigue, he says his stamina has not been where it used to be. Pt states he only eats one big meal a day at dinner. No chest pain.   BP Readings from Last 3 Encounters:  07/17/14 118/72  05/09/14 130/80  12/02/13 150/98   History of Prostate Cancer: Status post prostatectomy 2011, he uses alprostadil injection as needed for erectile dysfunction . His urologist is Dr. Risa Grill. He was referred back to his urologist by Dr. Linna Darner at his March visit.   Depression: He takes Zoloft 100 mg once daily.  He typically drinks 3-4 drinks a night about 4 days a week.   There are no active problems to display for this patient.  Past Medical History  Diagnosis Date  . Hypertension    No past surgical history on file. No Known Allergies Prior to Admission medications   Medication Sig Start Date End Date Taking? Authorizing Provider  alprostadil (EDEX) 20 MCG injection 20 mcg by Intracavitary route as needed for erectile dysfunction. use no more than  3 times per week   Yes Historical Provider, MD  lisinopril (PRINIVIL,ZESTRIL) 20 MG tablet Take 1 tablet (20 mg total) by mouth daily. 05/09/14  Yes [name], MD  metoprolol succinate (TOPROL-XL) 50 MG 24 hr tablet Take 1 tablet (50 mg total) by mouth daily. Take with or immediately following a meal. 05/09/14  Yes [name], MD  sertraline (ZOLOFT) 100 MG tablet Take 1 tablet (100 mg total) by mouth daily. 05/09/14  Yes [name], MD   History   Social History  . Marital Status: Married    Spouse Name: N/A  . Number of Children: N/A  . Years of Education: N/A   Occupational History  . Not on file.   Social History Main Topics  . Smoking status: Current Every Day Smoker    Types: Cigars  . Smokeless tobacco: Not on file  . Alcohol Use: 12.6 oz/week    21 Cans of beer per week  . Drug Use: Not on file  . Sexual Activity: Yes   Other Topics Concern  . Not on file   Social History Narrative  Review of Systems  Constitutional: Positive for fatigue. Negative for unexpected weight change.  Eyes: Negative for visual disturbance.  Respiratory: Negative for cough, chest tightness and shortness of breath.   Cardiovascular: Negative for chest pain, palpitations and leg swelling.  Gastrointestinal: Negative for abdominal pain and blood in stool.  Neurological: Positive for  dizziness and light-headedness. Negative for headaches.     Objective:  BP 118/72 mmHg  Pulse 70  Temp(Src) 98.3 F (36.8 C) (Oral)  Resp 16  Ht 5' 11.5" (1.816 m)  Wt 256 lb 9.6 oz (116.393 kg)  BMI 35.29 kg/m2  SpO2 97% Physical Exam  Constitutional: He is oriented to person, place, and time. He appears well-developed and well-nourished.  HENT:  Head: Normocephalic and atraumatic.  Eyes: EOM are normal. Pupils are equal, round, and reactive to light.  Neck: No JVD present. Carotid bruit is not present.  Cardiovascular: Normal rate, regular rhythm and normal heart sounds.   No murmur  heard. Pulmonary/Chest: Effort normal and breath sounds normal. He has no rales.  Musculoskeletal: He exhibits no edema.  Neurological: He is alert and oriented to person, place, and time.  Skin: Skin is warm and dry.  Psychiatric: He has a normal mood and affect.  Vitals reviewed.     Assessment & Plan:   Jordan Hines is a 64 y.o. male Essential hypertension - Plan: lisinopril (PRINIVIL,ZESTRIL) 10 MG tablet  -possibly over treated vs. decreased po fluids   -decrease lisinopril to 10mg  qd., check home BP's and rtc precautions.   Other fatigue, Dizziness  -as above- may be related to relative volume depletion or overtreated HTN.  Discussed cutting back on alcohol. Recheck in next 4 weeks if not improving. RTC/ER precautions discussed if worsening.   Meds ordered this encounter  Medications  . lisinopril (PRINIVIL,ZESTRIL) 10 MG tablet    Sig: Take 1 tablet (10 mg total) by mouth daily.    Dispense:  30 tablet    Refill:  2   Patient Instructions  No more than 2 alcoholic drinks per day. Make sure you are drinking sufficient water during the day, and increase meals to 3 per day as discussed (with smaller portions at dinner). Decrease your lisinopril to 10mg  once per day. Keep a record of your blood pressures outside of the office and bring them to the next office visit. If they are running over 150/90 - let me know as we may need to increase your medicine back to previous dose. Continue metoprolol at same dose.   If the fatigue and dizziness episodes are not improving in the next 4 weeks with lowering the lisinopril dose - return to discuss other causes.   Return to the clinic or go to the nearest emergency room if any of your symptoms worsen or new symptoms occur.  Fatigue Fatigue is a feeling of tiredness, lack of energy, lack of motivation, or feeling tired all the time. Having enough rest, good nutrition, and reducing stress will normally reduce fatigue. Consult your  caregiver if it persists. The nature of your fatigue will help your caregiver to find out its cause. The treatment is based on the cause.  CAUSES  There are many causes for fatigue. Most of the time, fatigue can be traced to one or more of your habits or routines. Most causes fit into one or more of three general areas. They are: Lifestyle problems  Sleep disturbances.  Overwork.  Physical exertion.  Unhealthy habits.  Poor eating habits or eating disorders.  Alcohol and/or drug use .  Lack of proper nutrition (malnutrition). Psychological problems  Stress and/or anxiety problems.  Depression.  Grief.  Boredom. Medical Problems or Conditions  Anemia.  Pregnancy.  Thyroid gland problems.  Recovery from major surgery.  Continuous pain.  Emphysema or asthma that is not well controlled  Allergic conditions.  Diabetes.  Infections (such as mononucleosis).  Obesity.  Sleep disorders, such as sleep apnea.  Heart failure or other heart-related problems.  Cancer.  Kidney disease.  Liver disease.  Effects of certain medicines such as antihistamines, cough and cold remedies, prescription pain medicines, heart and blood pressure medicines, drugs used for treatment of cancer, and some antidepressants. SYMPTOMS  The symptoms of fatigue include:   Lack of energy.  Lack of drive (motivation).  Drowsiness.  Feeling of indifference to the surroundings. DIAGNOSIS  The details of how you feel help guide your caregiver in finding out what is causing the fatigue. You will be asked about your present and past health condition. It is important to review all medicines that you take, including prescription and non-prescription items. A thorough exam will be done. You will be questioned about your feelings, habits, and normal lifestyle. Your caregiver may suggest blood tests, urine tests, or other tests to look for common medical causes of fatigue.  TREATMENT  Fatigue is  treated by correcting the underlying cause. For example, if you have continuous pain or depression, treating these causes will improve how you feel. Similarly, adjusting the dose of certain medicines will help in reducing fatigue.  HOME CARE INSTRUCTIONS   Try to get the required amount of good sleep every night.  Eat a healthy and nutritious diet, and drink enough water throughout the day.  Practice ways of relaxing (including yoga or meditation).  Exercise regularly.  Make plans to change situations that cause stress. Act on those plans so that stresses decrease over time. Keep your work and personal routine reasonable.  Avoid street drugs and minimize use of alcohol.  Start taking a daily multivitamin after consulting your caregiver. SEEK MEDICAL CARE IF:   You have persistent tiredness, which cannot be accounted for.  You have fever.  You have unintentional weight loss.  You have headaches.  You have disturbed sleep throughout the night.  You are feeling sad.  You have constipation.  You have dry skin.  You have gained weight.  You are taking any new or different medicines that you suspect are causing fatigue.  You are unable to sleep at night.  You develop any unusual swelling of your legs or other parts of your body. SEEK IMMEDIATE MEDICAL CARE IF:   You are feeling confused.  Your vision is blurred.  You feel faint or pass out.  You develop severe headache.  You develop severe abdominal, pelvic, or back pain.  You develop chest pain, shortness of breath, or an irregular or fast heartbeat.  You are unable to pass a normal amount of urine.  You develop abnormal bleeding such as bleeding from the rectum or you vomit blood.  You have thoughts about harming yourself or committing suicide.  You are worried that you might harm someone else. MAKE SURE YOU:   Understand these instructions.  Will watch your condition.  Will get help right away if you  are not doing well or get worse. Document Released: 11/24/2006 Document Revised: 04/21/2011 Document Reviewed: 05/31/2013 [facility] Patient Information 2015 [place], [place]. This information is not intended to replace advice given to you by your health care provider. Make sure you discuss any questions you have with your health care provider.    Dizziness Dizziness is a common problem. It is a feeling of unsteadiness or light-headedness. You may feel like you are about to faint. Dizziness can lead to injury if you stumble or fall. A  person of any age group can suffer from dizziness, but dizziness is more common in older adults. CAUSES  Dizziness can be caused by many different things, including:  Middle ear problems.  Standing for too long.  Infections.  An allergic reaction.  Aging.  An emotional response to something, such as the sight of blood.  Side effects of medicines.  Tiredness.  Problems with circulation or blood pressure.  Excessive use of alcohol or medicines, or illegal drug use.  Breathing too fast (hyperventilation).  An irregular heart rhythm (arrhythmia).  A low red blood cell count (anemia).  Pregnancy.  Vomiting, diarrhea, fever, or other illnesses that cause body fluid loss (dehydration).  Diseases or conditions such as Parkinson's disease, high blood pressure (hypertension), diabetes, and thyroid problems.  Exposure to extreme heat. DIAGNOSIS  Your health care provider will ask about your symptoms, perform a physical exam, and perform an electrocardiogram (ECG) to record the electrical activity of your heart. Your health care provider may also perform other heart or blood tests to determine the cause of your dizziness. These may include:  Transthoracic echocardiogram (TTE). During echocardiography, sound waves are used to evaluate how blood flows through your heart.  Transesophageal echocardiogram (TEE).  Cardiac monitoring. This allows your health  care provider to monitor your heart rate and rhythm in real time.  Holter monitor. This is a portable device that records your heartbeat and can help diagnose heart arrhythmias. It allows your health care provider to track your heart activity for several days if needed.  Stress tests by exercise or by giving medicine that makes the heart beat faster. TREATMENT  Treatment of dizziness depends on the cause of your symptoms and can vary greatly. HOME CARE INSTRUCTIONS   Drink enough fluids to keep your urine clear or pale yellow. This is especially important in very hot weather. In older adults, it is also important in cold weather.  Take your medicine exactly as directed if your dizziness is caused by medicines. When taking blood pressure medicines, it is especially important to get up slowly.  Rise slowly from chairs and steady yourself until you feel okay.  In the morning, first sit up on the side of the bed. When you feel okay, stand slowly while holding onto something until you know your balance is fine.  Move your legs often if you need to stand in one place for a long time. Tighten and relax your muscles in your legs while standing.  Have someone stay with you for 1-2 days if dizziness continues to be a problem. Do this until you feel you are well enough to stay alone. Have the person call your health care provider if he or she notices changes in you that are concerning.  Do not drive or use heavy machinery if you feel dizzy.  Do not drink alcohol. SEEK IMMEDIATE MEDICAL CARE IF:   Your dizziness or light-headedness gets worse.  You feel nauseous or vomit.  You have problems talking, walking, or using your arms, hands, or legs.  You feel weak.  You are not thinking clearly or you have trouble forming sentences. It may take a friend or family member to notice this.  You have chest pain, abdominal pain, shortness of breath, or sweating.  Your vision changes.  You notice any  bleeding.  You have side effects from medicine that seems to be getting worse rather than better. MAKE SURE YOU:   Understand these instructions.  Will watch your condition.  Will  get help right away if you are not doing well or get worse. Document Released: 07/23/2000 Document Revised: 02/01/2013 Document Reviewed: 08/16/2010 [facility] Patient Information 2015 [place], [place]. This information is not intended to replace advice given to you by your health care provider. Make sure you discuss any questions you have with your health care provider.     I personally performed the services described in this documentation, which was scribed in my presence. The recorded information has been reviewed and considered, and addended by me as needed.

## 2014-07-17 NOTE — Patient Instructions (Addendum)
No more than 2 alcoholic drinks per day. Make sure you are drinking sufficient water during the day, and increase meals to 3 per day as discussed (with smaller portions at dinner). Decrease your lisinopril to 10mg  once per day. Keep a record of your blood pressures outside of the office and bring them to the next office visit. If they are running over 150/90 - let me know as we may need to increase your medicine back to previous dose. Continue metoprolol at same dose.   If the fatigue and dizziness episodes are not improving in the next 4 weeks with lowering the lisinopril dose - return to discuss other causes.   Return to the clinic or go to the nearest emergency room if any of your symptoms worsen or new symptoms occur.  Fatigue Fatigue is a feeling of tiredness, lack of energy, lack of motivation, or feeling tired all the time. Having enough rest, good nutrition, and reducing stress will normally reduce fatigue. Consult your caregiver if it persists. The nature of your fatigue will help your caregiver to find out its cause. The treatment is based on the cause.  CAUSES  There are many causes for fatigue. Most of the time, fatigue can be traced to one or more of your habits or routines. Most causes fit into one or more of three general areas. They are: Lifestyle problems  Sleep disturbances.  Overwork.  Physical exertion.  Unhealthy habits.  Poor eating habits or eating disorders.  Alcohol and/or drug use .  Lack of proper nutrition (malnutrition). Psychological problems  Stress and/or anxiety problems.  Depression.  Grief.  Boredom. Medical Problems or Conditions  Anemia.  Pregnancy.  Thyroid gland problems.  Recovery from major surgery.  Continuous pain.  Emphysema or asthma that is not well controlled  Allergic conditions.  Diabetes.  Infections (such as mononucleosis).  Obesity.  Sleep disorders, such as sleep apnea.  Heart failure or other  heart-related problems.  Cancer.  Kidney disease.  Liver disease.  Effects of certain medicines such as antihistamines, cough and cold remedies, prescription pain medicines, heart and blood pressure medicines, drugs used for treatment of cancer, and some antidepressants. SYMPTOMS  The symptoms of fatigue include:   Lack of energy.  Lack of drive (motivation).  Drowsiness.  Feeling of indifference to the surroundings. DIAGNOSIS  The details of how you feel help guide your caregiver in finding out what is causing the fatigue. You will be asked about your present and past health condition. It is important to review all medicines that you take, including prescription and non-prescription items. A thorough exam will be done. You will be questioned about your feelings, habits, and normal lifestyle. Your caregiver may suggest blood tests, urine tests, or other tests to look for common medical causes of fatigue.  TREATMENT  Fatigue is treated by correcting the underlying cause. For example, if you have continuous pain or depression, treating these causes will improve how you feel. Similarly, adjusting the dose of certain medicines will help in reducing fatigue.  HOME CARE INSTRUCTIONS   Try to get the required amount of good sleep every night.  Eat a healthy and nutritious diet, and drink enough water throughout the day.  Practice ways of relaxing (including yoga or meditation).  Exercise regularly.  Make plans to change situations that cause stress. Act on those plans so that stresses decrease over time. Keep your work and personal routine reasonable.  Avoid street drugs and minimize use of alcohol.  Start  taking a daily multivitamin after consulting your caregiver. SEEK MEDICAL CARE IF:   You have persistent tiredness, which cannot be accounted for.  You have fever.  You have unintentional weight loss.  You have headaches.  You have disturbed sleep throughout the  night.  You are feeling sad.  You have constipation.  You have dry skin.  You have gained weight.  You are taking any new or different medicines that you suspect are causing fatigue.  You are unable to sleep at night.  You develop any unusual swelling of your legs or other parts of your body. SEEK IMMEDIATE MEDICAL CARE IF:   You are feeling confused.  Your vision is blurred.  You feel faint or pass out.  You develop severe headache.  You develop severe abdominal, pelvic, or back pain.  You develop chest pain, shortness of breath, or an irregular or fast heartbeat.  You are unable to pass a normal amount of urine.  You develop abnormal bleeding such as bleeding from the rectum or you vomit blood.  You have thoughts about harming yourself or committing suicide.  You are worried that you might harm someone else. MAKE SURE YOU:   Understand these instructions.  Will watch your condition.  Will get help right away if you are not doing well or get worse. Document Released: 11/24/2006 Document Revised: 04/21/2011 Document Reviewed: 05/31/2013 [facility] Patient Information 2015 [place], [place]. This information is not intended to replace advice given to you by your health care provider. Make sure you discuss any questions you have with your health care provider.    Dizziness Dizziness is a common problem. It is a feeling of unsteadiness or light-headedness. You may feel like you are about to faint. Dizziness can lead to injury if you stumble or fall. A person of any age group can suffer from dizziness, but dizziness is more common in older adults. CAUSES  Dizziness can be caused by many different things, including:  Middle ear problems.  Standing for too long.  Infections.  An allergic reaction.  Aging.  An emotional response to something, such as the sight of blood.  Side effects of medicines.  Tiredness.  Problems with circulation or blood  pressure.  Excessive use of alcohol or medicines, or illegal drug use.  Breathing too fast (hyperventilation).  An irregular heart rhythm (arrhythmia).  A low red blood cell count (anemia).  Pregnancy.  Vomiting, diarrhea, fever, or other illnesses that cause body fluid loss (dehydration).  Diseases or conditions such as Parkinson's disease, high blood pressure (hypertension), diabetes, and thyroid problems.  Exposure to extreme heat. DIAGNOSIS  Your health care provider will ask about your symptoms, perform a physical exam, and perform an electrocardiogram (ECG) to record the electrical activity of your heart. Your health care provider may also perform other heart or blood tests to determine the cause of your dizziness. These may include:  Transthoracic echocardiogram (TTE). During echocardiography, sound waves are used to evaluate how blood flows through your heart.  Transesophageal echocardiogram (TEE).  Cardiac monitoring. This allows your health care provider to monitor your heart rate and rhythm in real time.  Holter monitor. This is a portable device that records your heartbeat and can help diagnose heart arrhythmias. It allows your health care provider to track your heart activity for several days if needed.  Stress tests by exercise or by giving medicine that makes the heart beat faster. TREATMENT  Treatment of dizziness depends on the cause of your symptoms and  can vary greatly. HOME CARE INSTRUCTIONS   Drink enough fluids to keep your urine clear or pale yellow. This is especially important in very hot weather. In older adults, it is also important in cold weather.  Take your medicine exactly as directed if your dizziness is caused by medicines. When taking blood pressure medicines, it is especially important to get up slowly.  Rise slowly from chairs and steady yourself until you feel okay.  In the morning, first sit up on the side of the bed. When you feel okay,  stand slowly while holding onto something until you know your balance is fine.  Move your legs often if you need to stand in one place for a long time. Tighten and relax your muscles in your legs while standing.  Have someone stay with you for 1-2 days if dizziness continues to be a problem. Do this until you feel you are well enough to stay alone. Have the person call your health care provider if he or she notices changes in you that are concerning.  Do not drive or use heavy machinery if you feel dizzy.  Do not drink alcohol. SEEK IMMEDIATE MEDICAL CARE IF:   Your dizziness or light-headedness gets worse.  You feel nauseous or vomit.  You have problems talking, walking, or using your arms, hands, or legs.  You feel weak.  You are not thinking clearly or you have trouble forming sentences. It may take a friend or family member to notice this.  You have chest pain, abdominal pain, shortness of breath, or sweating.  Your vision changes.  You notice any bleeding.  You have side effects from medicine that seems to be getting worse rather than better. MAKE SURE YOU:   Understand these instructions.  Will watch your condition.  Will get help right away if you are not doing well or get worse. Document Released: 07/23/2000 Document Revised: 02/01/2013 Document Reviewed: 08/16/2010 [facility] Patient Information 2015 [place], [place]. This information is not intended to replace advice given to you by your health care provider. Make sure you discuss any questions you have with your health care provider.

## 2014-08-14 ENCOUNTER — Other Ambulatory Visit: Payer: Self-pay

## 2014-08-14 DIAGNOSIS — F32A Depression, unspecified: Secondary | ICD-10-CM

## 2014-08-14 DIAGNOSIS — I10 Essential (primary) hypertension: Secondary | ICD-10-CM

## 2014-08-14 DIAGNOSIS — F329 Major depressive disorder, single episode, unspecified: Secondary | ICD-10-CM

## 2014-08-14 MED ORDER — LISINOPRIL 10 MG PO TABS: 10.0000 mg | ORAL_TABLET | Freq: Every day | ORAL | Status: DC

## 2014-08-14 MED ORDER — SERTRALINE HCL 100 MG PO TABS: 100.0000 mg | ORAL_TABLET | Freq: Every day | ORAL | Status: DC

## 2014-08-14 MED ORDER — METOPROLOL SUCCINATE ER 50 MG PO TB24: 50.0000 mg | ORAL_TABLET | Freq: Every day | ORAL | Status: DC

## 2014-08-18 DIAGNOSIS — C61 Malignant neoplasm of prostate: Secondary | ICD-10-CM | POA: Diagnosis not present

## 2014-08-18 DIAGNOSIS — N359 Urethral stricture, unspecified: Secondary | ICD-10-CM | POA: Diagnosis not present

## 2014-08-18 DIAGNOSIS — N320 Bladder-neck obstruction: Secondary | ICD-10-CM | POA: Diagnosis not present

## 2014-08-18 DIAGNOSIS — N5201 Erectile dysfunction due to arterial insufficiency: Secondary | ICD-10-CM | POA: Diagnosis not present

## 2014-09-04 ENCOUNTER — Encounter: Payer: Self-pay | Admitting: Family Medicine

## 2014-09-04 ENCOUNTER — Ambulatory Visit (INDEPENDENT_AMBULATORY_CARE_PROVIDER_SITE_OTHER): Payer: Commercial Managed Care - HMO | Admitting: Family Medicine

## 2014-09-04 ENCOUNTER — Ambulatory Visit (INDEPENDENT_AMBULATORY_CARE_PROVIDER_SITE_OTHER): Payer: Commercial Managed Care - HMO

## 2014-09-04 VITALS — BP 127/87 | HR 78 | Temp 97.7°F | Resp 16 | Ht 71.5 in | Wt 260.0 lb

## 2014-09-04 DIAGNOSIS — Z0000 Encounter for general adult medical examination without abnormal findings: Secondary | ICD-10-CM

## 2014-09-04 DIAGNOSIS — R7309 Other abnormal glucose: Secondary | ICD-10-CM | POA: Diagnosis not present

## 2014-09-04 DIAGNOSIS — R059 Cough, unspecified: Secondary | ICD-10-CM

## 2014-09-04 DIAGNOSIS — Z720 Tobacco use: Secondary | ICD-10-CM | POA: Diagnosis not present

## 2014-09-04 DIAGNOSIS — R05 Cough: Secondary | ICD-10-CM

## 2014-09-04 DIAGNOSIS — E781 Pure hyperglyceridemia: Secondary | ICD-10-CM

## 2014-09-04 DIAGNOSIS — K5900 Constipation, unspecified: Secondary | ICD-10-CM | POA: Diagnosis not present

## 2014-09-04 DIAGNOSIS — F17290 Nicotine dependence, other tobacco product, uncomplicated: Secondary | ICD-10-CM

## 2014-09-04 DIAGNOSIS — R739 Hyperglycemia, unspecified: Secondary | ICD-10-CM

## 2014-09-04 DIAGNOSIS — Z23 Encounter for immunization: Secondary | ICD-10-CM | POA: Diagnosis not present

## 2014-09-04 DIAGNOSIS — I10 Essential (primary) hypertension: Secondary | ICD-10-CM

## 2014-09-04 LAB — LIPID PANEL
CHOLESTEROL: 180 mg/dL (ref 125–200)
HDL: 63 mg/dL (ref >=40)
LDL CALC: 88 mg/dL (ref <130)
Total CHOL/HDL Ratio: 2.9 Ratio (ref <=5.0)
Triglycerides: 144 mg/dL (ref <150)
VLDL: 29 mg/dL (ref <30)

## 2014-09-04 LAB — COMPLETE METABOLIC PANEL WITH GFR
ALT: 22 U/L (ref 9–46)
AST: 20 U/L (ref 10–35)
Albumin: 4.2 g/dL (ref 3.6–5.1)
Alkaline Phosphatase: 79 U/L (ref 40–115)
BUN: 10 mg/dL (ref 7–25)
CHLORIDE: 101 mmol/L (ref 98–110)
CO2: 27 mmol/L (ref 20–31)
Calcium: 9 mg/dL (ref 8.6–10.3)
Creat: 1.09 mg/dL (ref 0.70–1.25)
GFR, Est African American: 82 mL/min (ref >=60)
GFR, Est Non African American: 71 mL/min (ref >=60)
GLUCOSE: 98 mg/dL (ref 65–99)
Potassium: 4.1 mmol/L (ref 3.5–5.3)
SODIUM: 138 mmol/L (ref 135–146)
TOTAL PROTEIN: 6.9 g/dL (ref 6.1–8.1)
Total Bilirubin: 0.9 mg/dL (ref 0.2–1.2)

## 2014-09-04 LAB — HEMOGLOBIN A1C
Hgb A1c MFr Bld: 6.3 % — ABNORMAL HIGH (ref <5.7)
Mean Plasma Glucose: 134 mg/dL — ABNORMAL HIGH (ref <117)

## 2014-09-04 LAB — TSH: TSH: 3.632 u[IU]/mL (ref 0.350–4.500)

## 2014-09-04 MED ORDER — ZOSTER VACCINE LIVE 19400 UNT/0.65ML ~~LOC~~ SOLR: 0.6500 mL | Freq: Once | SUBCUTANEOUS | Status: DC

## 2014-09-04 NOTE — Progress Notes (Signed)
   Subjective:    Patient ID: Jordan Hines, male    DOB: Sep 30, 1950, 64 y.o.   MRN: [id number]  HPI    Review of Systems  Constitutional: Positive for activity change and fatigue.  HENT: Negative.   Eyes: Positive for photophobia, redness and visual disturbance.  Respiratory: Positive for cough and shortness of breath.   Cardiovascular: Negative.   Gastrointestinal: Positive for abdominal pain and constipation.  Endocrine: Negative.   Genitourinary: Negative.   Musculoskeletal: Positive for myalgias.  Skin: Negative.   Allergic/Immunologic: Negative.   Neurological: Negative.   Hematological: Negative.   Psychiatric/Behavioral: The patient is nervous/anxious.        Objective:   Physical Exam        Assessment & Plan:

## 2014-09-04 NOTE — Progress Notes (Addendum)
Subjective:   This chart was scribed for Jordan Ray, MD by Erling Conte, Medical Scribe. This patient was seen in Room 21 and the patient's care was started at 2:35 PM.   Patient ID: Jordan Hines, male    DOB: [birth year], 64 y.o.   MRN: [id number]  Chief Complaint  Patient presents with  . Annual Exam    HPI Jordan Hines is a 64 y.o. male here for complete annual physical  Pt has a h/o HTN and depression. He was last seen by Dr. Carlota Raspberry in June 2016 to establish care  1. Hypertension Pt was having occasional lightheadedness with activity at his last visit. Suspected some volume depletion or over treated hypertension. Advised to decrease alcohol and increase water intake and increase meals to 3x daily. Decreased Lisinopril to 10 mg QD. He states his lightheadedness and dizziness spells have been getting better.  2. Depression Pt takes Zoloft 100 mg QD.   Depression screen [facility] 2/9 09/04/2014 07/17/2014  Decreased Interest 0 0  Down, Depressed, Hopeless 0 0  PHQ - 2 Score 0 0   Functional status screening--no positive responses on screening tool  He states that the Zoloft medication is working well and he has no complaints at this time.   3. Prostate Cancer Pt has a h/o prostate cancer for which he sees Dr. Risa Grill. He had a prostatectomy in 2011. Pt takes alprostadil injection as needed for ED. Last PSA 0.17 on July 1st 2016 at Dr. Cy Blamer office  4. Here for Welcome to Medicare Physical  5. Cancer screening Prostate cancer as above, followed by Dr. Risa Grill.   Colon Cancer screening- per paper chart, January 2011 colonoscopy at [facility]. Repeat in 10 yrs  6. Immunizations Immunization History  Administered Date(s) Administered  . Pneumococcal Polysaccharide-23 09/17/2010  . Tdap 08/21/2008  Shingles Vaccine: Pt does not recall receiving shingles vaccine  Due for Prevnar, will receive today  7. Hearing Screening Past whisper test in office  8.  Vision  Visual Acuity Screening   Right eye Left eye Both eyes  Without correction:     With correction: 20/40 20/25 20/20   Pt states he has chronic photophobia. He notes some intermittent eye redness for which he uses eye drops with relief. He has not seen an eye doctor recently.  9. Dentist Pt has no recent visit, plans on scheduling  10. Advanced Directives He does not have but materials we given for him today. Pt would want full code done for life saving efforts. He is going to discuss with his family.  11. Hypertriglyceridemia Noted on lab work in March. He notes some muscle stiffness in his knees and shoulders. He is not currently taking any medication for this  12. Hyperglycemia Noted on lab work in March  13. Constipation Pt states he has been having constipation onset 2 weeks. He states that he is having associated abdominal distention. Pt reports he used an OTC laxative with minimal relief. He has not had a bowel movement in 3-4 days. He states he urinates regularly and denies any hematuria.  14. Cough and SOB He states the cough and COB has been intermittent for 2 months. Pt is a current everyday smoker, cigars. He states he has been smoking cigars for 8 years. He denies any unexpected weight loss, hemoptysis or diaphoresis or chest pain. He denies h/o lung cancer in family.   There are no active problems to display for this patient.  Past Medical History  Diagnosis Date  . Hypertension   . Anxiety   . Arthritis   . Cancer   . Depression    Past Surgical History  Procedure Laterality Date  . Prostate surgery     No Known Allergies Prior to Admission medications   Medication Sig Start Date End Date Taking? Authorizing Provider  alprostadil (EDEX) 20 MCG injection 20 mcg by Intracavitary route as needed for erectile dysfunction. use no more than 3 times per week   Yes Historical Provider, MD  lisinopril (PRINIVIL,ZESTRIL) 10 MG tablet Take 1 tablet (10 mg total) by  mouth daily. 08/14/14  Yes Chelle Jeffery, PA-C  metoprolol succinate (TOPROL-XL) 50 MG 24 hr tablet Take 1 tablet (50 mg total) by mouth daily. Take with or immediately following a meal. 08/14/14  Yes Chelle Jeffery, PA-C  sertraline (ZOLOFT) 100 MG tablet Take 1 tablet (100 mg total) by mouth daily. 08/14/14  Yes Harrison Mons, PA-C   History   Social History  . Marital Status: Married    Spouse Name: N/A  . Number of Children: N/A  . Years of Education: N/A   Occupational History  . Not on file.   Social History Main Topics  . Smoking status: Current Every Day Smoker    Types: Cigars  . Smokeless tobacco: Not on file  . Alcohol Use: 4.2 oz/week    7 Cans of beer per week  . Drug Use: No  . Sexual Activity: Yes   Other Topics Concern  . Not on file   Social History Narrative    Review of Systems   13 point ROS completed on patient health survey see listing on nursing note otherwise as listed above     Objective:   Physical Exam  Constitutional: He is oriented to person, place, and time. He appears well-developed and well-nourished.  HENT:  Head: Normocephalic and atraumatic.  Right Ear: External ear normal.  Left Ear: External ear normal.  Mouth/Throat: Oropharynx is clear and moist.  Eyes: Conjunctivae and EOM are normal. Pupils are equal, round, and reactive to light.  Neck: Normal range of motion. Neck supple. No JVD present. Carotid bruit is not present. No thyromegaly present.  Cardiovascular: Normal rate, regular rhythm, normal heart sounds and intact distal pulses.   No murmur heard. Pulmonary/Chest: Effort normal and breath sounds normal. No respiratory distress. He has no wheezes. He has no rales.  Abdominal: Soft. Normal appearance. He exhibits no distension. There is no tenderness. There is no rebound and no guarding.  Musculoskeletal: Normal range of motion. He exhibits no edema or tenderness.       Right shoulder: He exhibits normal range of motion and  normal strength (full rotator cuff strength).  Crepitus of bilateral knees. Full ROM but no swelling  Lymphadenopathy:    He has no cervical adenopathy.  Neurological: He is alert and oriented to person, place, and time. He has normal reflexes.  Skin: Skin is warm and dry.  Psychiatric: He has a normal mood and affect. His behavior is normal.  Vitals reviewed.    Filed Vitals:   09/04/14 1058  BP: 127/87  Pulse: 78  Temp: 97.7 F (36.5 C)  TempSrc: Oral  Resp: 16  Height: 5' 11.5" (1.816 m)  Weight: 260 lb (117.935 kg)  SpO2: 97%   [facility] reading (PRIMARY) by  Dr. Carlota Raspberry: CXR: no acute findings  EKG: SR,  no acute findings.     Assessment & Plan:   DAWIT TANKARD is a  64 y.o. male Welcome to Medicare preventive visit - Plan: EKG 12-Lead  Anticipatory guidance discussed, screening as above in history of present illness without concerning findings.  Need for pneumococcal vaccine - Plan: Pneumococcal conjugate vaccine 13-valent IM given.  Essential hypertension - Plan: EKG 12-Lead, COMPLETE METABOLIC PANEL WITH GFR, Hemoglobin A1c, TSH  Stable in office.  Continue same medications, CMP and lipid panel pending.  Hypertriglyceridemia - Plan: EKG 12-Lead, COMPLETE METABOLIC PANEL WITH GFR, Lipid panel  -Labs pending.  Hyperglycemia - Plan: COMPLETE METABOLIC PANEL WITH GFR, Hemoglobin A1c  -Check A1c, watch diet and exercise to manage weight.  Constipation, unspecified constipation type - Plan: TSH  -Increase water and fiber in the diet, can try over-the-counter Colace then MiraLAX if no bowel movement. He is on constipation treatment, but if persistent symptoms return to recheck.  Cough, Cigar smoker - Plan: DG Chest 2 View  -No apparent concerning findings on chest x-Hines, recommended to quit smoking then if the cough persisted return to look into other causes. Resources given for smoking cessation.  Need for shingles vaccine - Plan: zoster vaccine live, PF, (ZOSTAVAX)  19400 UNT/0.65ML injection prescription printed for his pharmacy.  Can discuss with them the cost with his current plan.   Meds ordered this encounter  Medications  . zoster vaccine live, PF, (ZOSTAVAX) 96789 UNT/0.65ML injection    Sig: Inject 19,400 Units into the skin once.    Dispense:  1 each    Refill:  0   Patient Instructions  Increase water and fiber in diet.  Miralax over the counter if no bowel movement in 2 days. Colace as needed as a stool softener. If constipation not improving not improving in next 10 days - return for recheck. Return to the clinic or go to the nearest emergency room if any of your symptoms worsen or new symptoms occur.  Make an appointment to see eye doctor for screening eye exam (need to be at least once per year). Call dentist to schedule appointment.  Let me know if you need names for this.   Stop cigar smoking. [place] offers smoking cessation clinics. Registration is required. To register call [phone] or register online at [email or web address] If cough not improving with quitting smoking - return to discuss cough further.  Return to the clinic or go to the nearest emergency room if any of your symptoms worsen or new symptoms occur.  Tylenol as needed for stiff knees or shoulder, as likely arthritis, but if not improving with occasional tylenol use - return to discuss these areas further.    Constipation Constipation is when a person has fewer than three bowel movements a week, has difficulty having a bowel movement, or has stools that are dry, hard, or larger than normal. As people grow older, constipation is more common. If you try to fix constipation with medicines that make you have a bowel movement (laxatives), the problem may get worse. Long-term laxative use may cause the muscles of the colon to become weak. A low-fiber diet, not taking in enough fluids, and taking certain medicines may make constipation worse.  CAUSES  1. Certain medicines,  such as antidepressants, pain medicine, iron supplements, antacids, and water pills.  2. Certain diseases, such as diabetes, irritable bowel syndrome (IBS), thyroid disease, or depression.  3. Not drinking enough water.  4. Not eating enough fiber-rich foods.  5. Stress or travel.  6. Lack of physical activity or exercise.  7. Ignoring the urge to have a bowel  movement.  8. Using laxatives too much.  SIGNS AND SYMPTOMS  1. Having fewer than three bowel movements a week.  2. Straining to have a bowel movement.  3. Having stools that are hard, dry, or larger than normal.  4. Feeling full or bloated.  5. Pain in the lower abdomen.  6. Not feeling relief after having a bowel movement.  DIAGNOSIS  Your health care provider will take a medical history and perform a physical exam. Further testing may be done for severe constipation. Some tests may include: 1. A barium enema X-Hines to examine your rectum, colon, and, sometimes, your small intestine.  2. A sigmoidoscopy to examine your lower colon.  3. A colonoscopy to examine your entire colon. TREATMENT  Treatment will depend on the severity of your constipation and what is causing it. Some dietary treatments include drinking more fluids and eating more fiber-rich foods. Lifestyle treatments may include regular exercise. If these diet and lifestyle recommendations do not help, your health care provider may recommend taking over-the-counter laxative medicines to help you have bowel movements. Prescription medicines may be prescribed if over-the-counter medicines do not work.  HOME CARE INSTRUCTIONS   Eat foods that have a lot of fiber, such as fruits, vegetables, whole grains, and beans.  Limit foods high in fat and processed sugars, such as french fries, hamburgers, cookies, candies, and soda.   A fiber supplement may be added to your diet if you cannot get enough fiber from foods.   Drink enough fluids to keep your urine  clear or pale yellow.   Exercise regularly or as directed by your health care provider.   Go to the restroom when you have the urge to go. Do not hold it.   Only take over-the-counter or prescription medicines as directed by your health care provider. Do not take other medicines for constipation without talking to your health care provider first.  [place] IF:   You have bright red blood in your stool.   Your constipation lasts for more than 4 days or gets worse.   You have abdominal or rectal pain.   You have thin, pencil-like stools.   You have unexplained weight loss. MAKE SURE YOU:   Understand these instructions.  Will watch your condition.  Will get help right away if you are not doing well or get worse. Document Released: 10/26/2003 Document Revised: 02/01/2013 Document Reviewed: 11/08/2012 [facility] Patient Information 2015 [place], [place]. This information is not intended to replace advice given to you by your health care provider. Make sure you discuss any questions you have with your health care provider.   Cough, Adult  A cough is a reflex that helps clear your throat and airways. It can help heal the body or may be a reaction to an irritated airway. A cough may only last 2 or 3 weeks (acute) or may last more than 8 weeks (chronic).  CAUSES Acute cough: 9. Viral or bacterial infections. Chronic cough: 7. Infections. 8. Allergies. 9. Asthma. 10. Post-nasal drip. 11. Smoking. 12. Heartburn or acid reflux. 13. Some medicines. 14. Chronic lung problems (COPD). 15. Cancer. SYMPTOMS  4. Cough. 5. Fever. 6. Chest pain. 7. Increased breathing rate. 8. High-pitched whistling sound when breathing (wheezing). 9. Colored mucus that you cough up (sputum). TREATMENT   A bacterial cough may be treated with antibiotic medicine.  A viral cough must run its course and will not respond to antibiotics.  Your caregiver may recommend other  treatments  if you have a chronic cough. HOME CARE INSTRUCTIONS   Only take over-the-counter or prescription medicines for pain, discomfort, or fever as directed by your caregiver. Use cough suppressants only as directed by your caregiver.  Use a cold steam vaporizer or humidifier in your bedroom or home to help loosen secretions.  Sleep in a semi-upright position if your cough is worse at night.  Rest as needed.  Stop smoking if you smoke. SEEK IMMEDIATE MEDICAL CARE IF:   You have pus in your sputum.  Your cough starts to worsen.  You cannot control your cough with suppressants and are losing sleep.  You begin coughing up blood.  You have difficulty breathing.  You develop pain which is getting worse or is uncontrolled with medicine.  You have a fever. MAKE SURE YOU:   Understand these instructions.  Will watch your condition.  Will get help right away if you are not doing well or get worse. Document Released: 07/26/2010 Document Revised: 04/21/2011 Document Reviewed: 07/26/2010 [facility] Patient Information 2015 [place], [place]. This information is not intended to replace advice given to you by your health care provider. Make sure you discuss any questions you have with your health care provider.  Keeping you healthy  Get these tests  Blood pressure- Have your blood pressure checked once a year by your healthcare provider.  Normal blood pressure is 120/80  Weight- Have your body mass index (BMI) calculated to screen for obesity.  BMI is a measure of body fat based on height and weight. You can also calculate your own BMI at [email or web address].  Cholesterol- Have your cholesterol checked every year.  Diabetes- Have your blood sugar checked regularly if you have high blood pressure, high cholesterol, have a family history of diabetes or if you are overweight.  Screening for Colon Cancer- Colonoscopy starting at age 52.  Screening may begin sooner depending on  your family history and other health conditions. Follow up colonoscopy as directed by your Gastroenterologist.  Screening for Prostate Cancer- Both blood work (PSA) and a rectal exam help screen for Prostate Cancer.  Screening begins at age 83 with African-American men and at age 53 with Caucasian men.  Screening may begin sooner depending on your family history.  Take these medicines  Aspirin- One aspirin daily can help prevent Heart disease and Stroke.  Flu shot- Every fall.  Tetanus- Every 10 years.  Zostavax- Once after the age of 35 to prevent Shingles.  Pneumonia shot- Once after the age of 82; if you are younger than 62, ask your healthcare provider if you need a Pneumonia shot.  Take these steps  Don't smoke- If you do smoke, talk to your doctor about quitting.  For tips on how to quit, go to www.smokefree.gov or call 1-800-QUIT-NOW.  Be physically active- Exercise 5 days a week for at least 30 minutes.  If you are not already physically active start slow and gradually work up to 30 minutes of moderate physical activity.  Examples of moderate activity include walking briskly, mowing the yard, dancing, swimming, bicycling, etc.  Eat a healthy diet- Eat a variety of healthy food such as fruits, vegetables, low fat milk, low fat cheese, yogurt, lean meant, poultry, fish, beans, tofu, etc. For more information go to www.thenutritionsource.org  Drink alcohol in moderation- Limit alcohol intake to less than two drinks a day. Never drink and drive.  Dentist- Brush and floss twice daily; visit your dentist twice a year.  Depression- Your emotional health is as  important as your physical health. If you're feeling down, or losing interest in things you would normally enjoy please talk to your healthcare provider.  Eye exam- Visit your eye doctor every year.  Safe sex- If you may be exposed to a sexually transmitted infection, use a condom.  Seat belts- Seat belts can save your life;  always wear one.  Smoke/Carbon Monoxide detectors- These detectors need to be installed on the appropriate level of your home.  Replace batteries at least once a year.  Skin cancer- When out in the sun, cover up and use sunscreen 15 SPF or higher.  Violence- If anyone is threatening you, please tell your healthcare provider.  Living Will/ Health care power of attorney- Speak with your healthcare provider and family.     I personally performed the services described in this documentation, which was scribed in my presence. The recorded information has been reviewed and considered, and addended by me as needed.

## 2014-09-04 NOTE — Patient Instructions (Addendum)
Increase water and fiber in diet.  Miralax over the counter if no bowel movement in 2 days. Colace as needed as a stool softener. If constipation not improving not improving in next 10 days - return for recheck. Return to the clinic or go to the nearest emergency room if any of your symptoms worsen or new symptoms occur.  Make an appointment to see eye doctor for screening eye exam (need to be at least once per year). Call dentist to schedule appointment.  Let me know if you need names for this.   Stop cigar smoking. [place] offers smoking cessation clinics. Registration is required. To register call [phone] or register online at [email or web address] If cough not improving with quitting smoking - return to discuss cough further.  Return to the clinic or go to the nearest emergency room if any of your symptoms worsen or new symptoms occur.  Tylenol as needed for stiff knees or shoulder, as likely arthritis, but if not improving with occasional tylenol use - return to discuss these areas further.    Constipation Constipation is when a person has fewer than three bowel movements a week, has difficulty having a bowel movement, or has stools that are dry, hard, or larger than normal. As people grow older, constipation is more common. If you try to fix constipation with medicines that make you have a bowel movement (laxatives), the problem may get worse. Long-term laxative use may cause the muscles of the colon to become weak. A low-fiber diet, not taking in enough fluids, and taking certain medicines may make constipation worse.  CAUSES  1. Certain medicines, such as antidepressants, pain medicine, iron supplements, antacids, and water pills.  2. Certain diseases, such as diabetes, irritable bowel syndrome (IBS), thyroid disease, or depression.  3. Not drinking enough water.  4. Not eating enough fiber-rich foods.  5. Stress or travel.  6. Lack of physical activity or exercise.  7. Ignoring  the urge to have a bowel movement.  8. Using laxatives too much.  SIGNS AND SYMPTOMS  1. Having fewer than three bowel movements a week.  2. Straining to have a bowel movement.  3. Having stools that are hard, dry, or larger than normal.  4. Feeling full or bloated.  5. Pain in the lower abdomen.  6. Not feeling relief after having a bowel movement.  DIAGNOSIS  Your health care provider will take a medical history and perform a physical exam. Further testing may be done for severe constipation. Some tests may include: 1. A barium enema X-ray to examine your rectum, colon, and, sometimes, your small intestine.  2. A sigmoidoscopy to examine your lower colon.  3. A colonoscopy to examine your entire colon. TREATMENT  Treatment will depend on the severity of your constipation and what is causing it. Some dietary treatments include drinking more fluids and eating more fiber-rich foods. Lifestyle treatments may include regular exercise. If these diet and lifestyle recommendations do not help, your health care provider may recommend taking over-the-counter laxative medicines to help you have bowel movements. Prescription medicines may be prescribed if over-the-counter medicines do not work.  HOME CARE INSTRUCTIONS   Eat foods that have a lot of fiber, such as fruits, vegetables, whole grains, and beans.  Limit foods high in fat and processed sugars, such as french fries, hamburgers, cookies, candies, and soda.   A fiber supplement may be added to your diet if you cannot get enough fiber from foods.   Drink enough fluids  to keep your urine clear or pale yellow.   Exercise regularly or as directed by your health care provider.   Go to the restroom when you have the urge to go. Do not hold it.   Only take over-the-counter or prescription medicines as directed by your health care provider. Do not take other medicines for constipation without talking to your health care provider  first.  [place] IF:   You have bright red blood in your stool.   Your constipation lasts for more than 4 days or gets worse.   You have abdominal or rectal pain.   You have thin, pencil-like stools.   You have unexplained weight loss. MAKE SURE YOU:   Understand these instructions.  Will watch your condition.  Will get help right away if you are not doing well or get worse. Document Released: 10/26/2003 Document Revised: 02/01/2013 Document Reviewed: 11/08/2012 [facility] Patient Information 2015 [place], [place]. This information is not intended to replace advice given to you by your health care provider. Make sure you discuss any questions you have with your health care provider.   Cough, Adult  A cough is a reflex that helps clear your throat and airways. It can help heal the body or may be a reaction to an irritated airway. A cough may only last 2 or 3 weeks (acute) or may last more than 8 weeks (chronic).  CAUSES Acute cough: 9. Viral or bacterial infections. Chronic cough: 7. Infections. 8. Allergies. 9. Asthma. 10. Post-nasal drip. 11. Smoking. 12. Heartburn or acid reflux. 13. Some medicines. 14. Chronic lung problems (COPD). 15. Cancer. SYMPTOMS  4. Cough. 5. Fever. 6. Chest pain. 7. Increased breathing rate. 8. High-pitched whistling sound when breathing (wheezing). 9. Colored mucus that you cough up (sputum). TREATMENT   A bacterial cough may be treated with antibiotic medicine.  A viral cough must run its course and will not respond to antibiotics.  Your caregiver may recommend other treatments if you have a chronic cough. HOME CARE INSTRUCTIONS   Only take over-the-counter or prescription medicines for pain, discomfort, or fever as directed by your caregiver. Use cough suppressants only as directed by your caregiver.  Use a cold steam vaporizer or humidifier in your bedroom or home to help loosen secretions.  Sleep in a  semi-upright position if your cough is worse at night.  Rest as needed.  Stop smoking if you smoke. SEEK IMMEDIATE MEDICAL CARE IF:   You have pus in your sputum.  Your cough starts to worsen.  You cannot control your cough with suppressants and are losing sleep.  You begin coughing up blood.  You have difficulty breathing.  You develop pain which is getting worse or is uncontrolled with medicine.  You have a fever. MAKE SURE YOU:   Understand these instructions.  Will watch your condition.  Will get help right away if you are not doing well or get worse. Document Released: 07/26/2010 Document Revised: 04/21/2011 Document Reviewed: 07/26/2010 [facility] Patient Information 2015 [place], [place]. This information is not intended to replace advice given to you by your health care provider. Make sure you discuss any questions you have with your health care provider.  Keeping you healthy  Get these tests  Blood pressure- Have your blood pressure checked once a year by your healthcare provider.  Normal blood pressure is 120/80  Weight- Have your body mass index (BMI) calculated to screen for obesity.  BMI is a measure of body fat based on  height and weight. You can also calculate your own BMI at [email or web address].[email or web address].  Cholesterol- Have your cholesterol checked every year.  Diabetes- Have your blood sugar checked regularly if you have high blood pressure, high cholesterol, have a family history of diabetes or if you are overweight.  Screening for Colon Cancer- Colonoscopy starting at age 13.  Screening may begin sooner depending on your family history and other health conditions. Follow up colonoscopy as directed by your Gastroenterologist.  Screening for Prostate Cancer- Both blood work (PSA) and a rectal exam help screen for Prostate Cancer.  Screening begins at age 16 with African-American men and at age 29 with Caucasian men.  Screening may begin sooner depending on your  family history.  Take these medicines  Aspirin- One aspirin daily can help prevent Heart disease and Stroke.  Flu shot- Every fall.  Tetanus- Every 10 years.  Zostavax- Once after the age of 43 to prevent Shingles.  Pneumonia shot- Once after the age of 57; if you are younger than 50, ask your healthcare provider if you need a Pneumonia shot.  Take these steps  Don't smoke- If you do smoke, talk to your doctor about quitting.  For tips on how to quit, go to www.smokefree.gov or call 1-800-QUIT-NOW.  Be physically active- Exercise 5 days a week for at least 30 minutes.  If you are not already physically active start slow and gradually work up to 30 minutes of moderate physical activity.  Examples of moderate activity include walking briskly, mowing the yard, dancing, swimming, bicycling, etc.  Eat a healthy diet- Eat a variety of healthy food such as fruits, vegetables, low fat milk, low fat cheese, yogurt, lean meant, poultry, fish, beans, tofu, etc. For more information go to www.thenutritionsource.org  Drink alcohol in moderation- Limit alcohol intake to less than two drinks a day. Never drink and drive.  Dentist- Brush and floss twice daily; visit your dentist twice a year.  Depression- Your emotional health is as important as your physical health. If you're feeling down, or losing interest in things you would normally enjoy please talk to your healthcare provider.  Eye exam- Visit your eye doctor every year.  Safe sex- If you may be exposed to a sexually transmitted infection, use a condom.  Seat belts- Seat belts can save your life; always wear one.  Smoke/Carbon Monoxide detectors- These detectors need to be installed on the appropriate level of your home.  Replace batteries at least once a year.  Skin cancer- When out in the sun, cover up and use sunscreen 15 SPF or higher.  Violence- If anyone is threatening you, please tell your healthcare provider.  Living Will/  Health care power of attorney- Speak with your healthcare provider and family.

## 2014-09-16 ENCOUNTER — Encounter: Payer: Self-pay | Admitting: Family Medicine

## 2014-10-13 ENCOUNTER — Other Ambulatory Visit: Payer: Self-pay | Admitting: Physician Assistant

## 2014-11-13 DIAGNOSIS — H5210 Myopia, unspecified eye: Secondary | ICD-10-CM | POA: Diagnosis not present

## 2014-11-13 DIAGNOSIS — H524 Presbyopia: Secondary | ICD-10-CM | POA: Diagnosis not present

## 2014-12-11 ENCOUNTER — Encounter: Payer: Self-pay | Admitting: Family Medicine

## 2014-12-11 ENCOUNTER — Ambulatory Visit (INDEPENDENT_AMBULATORY_CARE_PROVIDER_SITE_OTHER): Payer: Commercial Managed Care - HMO | Admitting: Family Medicine

## 2014-12-11 VITALS — BP 120/88 | HR 85 | Temp 98.7°F | Resp 16 | Ht 71.75 in | Wt 268.2 lb

## 2014-12-11 DIAGNOSIS — E669 Obesity, unspecified: Secondary | ICD-10-CM

## 2014-12-11 DIAGNOSIS — R7303 Prediabetes: Secondary | ICD-10-CM

## 2014-12-11 DIAGNOSIS — I10 Essential (primary) hypertension: Secondary | ICD-10-CM

## 2014-12-11 DIAGNOSIS — F329 Major depressive disorder, single episode, unspecified: Secondary | ICD-10-CM | POA: Diagnosis not present

## 2014-12-11 DIAGNOSIS — Z23 Encounter for immunization: Secondary | ICD-10-CM

## 2014-12-11 DIAGNOSIS — F32A Depression, unspecified: Secondary | ICD-10-CM

## 2014-12-11 LAB — GLUCOSE, POCT (MANUAL RESULT ENTRY): POC Glucose: 140 mg/dl — AB (ref 70–99)

## 2014-12-11 LAB — POCT GLYCOSYLATED HEMOGLOBIN (HGB A1C): HEMOGLOBIN A1C: 6

## 2014-12-11 MED ORDER — LISINOPRIL 10 MG PO TABS: 10.0000 mg | ORAL_TABLET | Freq: Every day | ORAL | Status: DC

## 2014-12-11 MED ORDER — METOPROLOL SUCCINATE ER 50 MG PO TB24: 50.0000 mg | ORAL_TABLET | Freq: Every day | ORAL | Status: DC

## 2014-12-11 MED ORDER — SERTRALINE HCL 100 MG PO TABS: 100.0000 mg | ORAL_TABLET | Freq: Every day | ORAL | Status: DC

## 2014-12-11 NOTE — Progress Notes (Signed)
Subjective:    Patient ID: Jordan Hines, male    DOB: 10/21/1950, 64 y.o.   MRN: [id number] This chart was scribed for Merri Ray, MD by Zola Button, Medical Scribe. This patient was seen in Room 27 and the patient's care was started at 3:13 PM.    HPI HPI Comments: Jordan Hines is a 64 y.o. male who presents to the [facility] for a follow-up for hyperglycemia. He was seen for a welcome to Medicare physical on July 25th. Noted to have hyperglycemia on lab work March, 2016. Pre-diabetic by A1c in July. Recommended to watch diet and work on weight loss. Here for repeat testing. Patient denies chest pain, SOB, lightheadedness, and blurred vision. Lab Results  Component Value Date   HGBA1C 6.0 12/11/2014    Wt Readings from Last 3 Encounters:  12/11/14 268 lb 3.2 oz (121.655 kg)  09/04/14 260 lb (117.935 kg)  07/17/14 256 lb 9.6 oz (116.393 kg)   Body mass index is 36.65 kg/(m^2).  Hyperglycemia: Patient notes that he has cut back on his portions, but seems to have gained weight. He is still eating fast food, about 3 days a week. He does drink soda, about 32 oz a day. His job involves walking, but has not been exercising outside of work; he has been walking about 8000 steps a day. Patient admits to drinking about 16 oz of liquor a day, 4 days a week. He has never had withdrawal symptoms from alcohol. He states he does not have any problems with alcohol and can cut back if he chooses to.  Hypertension: He checks his blood pressure whenever he is at a pharmacy and has been getting readings around 130/80-90. Lab Results  Component Value Date   CREATININE 1.09 09/04/2014    Depression: Patient has been doing fine with the Zoloft without any difficulties. No change in symptoms.  There are no active problems to display for this patient.  Past Medical History  Diagnosis Date  . Hypertension   . Anxiety   . Arthritis   . Cancer ([place])   . Depression    Past  Surgical History  Procedure Laterality Date  . Prostate surgery     No Known Allergies Prior to Admission medications   Medication Sig Start Date End Date Taking? Authorizing Provider  alprostadil (EDEX) 20 MCG injection 20 mcg by Intracavitary route as needed for erectile dysfunction. use no more than 3 times per week    Historical Provider, MD  lisinopril (PRINIVIL,ZESTRIL) 10 MG tablet Take 1 tablet (10 mg total) by mouth daily. 08/14/14   Chelle Jeffery, PA-C  metoprolol succinate (TOPROL-XL) 50 MG 24 hr tablet Take 1 tablet (50 mg total) by mouth daily. Take with or immediately following a meal. 08/14/14   Chelle Jeffery, PA-C  sertraline (ZOLOFT) 100 MG tablet Take 1 tablet (100 mg total) by mouth daily. 08/14/14   Chelle Jeffery, PA-C  zoster vaccine live, PF, (ZOSTAVAX) 14431 UNT/0.65ML injection Inject 19,400 Units into the skin once. 09/04/14   [name], MD   Social History   Social History  . Marital Status: Married    Spouse Name: N/A  . Number of Children: N/A  . Years of Education: N/A   Occupational History  . Not on file.   Social History Main Topics  . Smoking status: Current Every Day Smoker    Types: Cigars  . Smokeless tobacco: Not on file  . Alcohol Use: 4.2 oz/week  7 Cans of beer per week  . Drug Use: No  . Sexual Activity: Yes   Other Topics Concern  . Not on file   Social History Narrative     Review of Systems  Eyes: Negative for visual disturbance.  Respiratory: Negative for shortness of breath.   Cardiovascular: Negative for chest pain.  Neurological: Negative for light-headedness.       Objective:   Physical Exam  Constitutional: He is oriented to person, place, and time. He appears well-developed and well-nourished.  HENT:  Head: Normocephalic and atraumatic.  Eyes: EOM are normal. Pupils are equal, round, and reactive to light.  Neck: No JVD present. Carotid bruit is not present.  Cardiovascular: Normal rate, regular rhythm and  normal heart sounds.   No murmur heard. Pulmonary/Chest: Effort normal and breath sounds normal. He has no rales.  Musculoskeletal: He exhibits no edema.  Neurological: He is alert and oriented to person, place, and time.  Skin: Skin is warm and dry.  Psychiatric: He has a normal mood and affect.  Vitals reviewed.  Results for orders placed or performed in visit on 12/11/14  POCT glucose (manual entry)  Result Value Ref Range   POC Glucose 140 (A) 70 - 99 mg/dl  POCT glycosylated hemoglobin (Hb A1C)  Result Value Ref Range   Hemoglobin A1C 6.0       Filed Vitals:   12/11/14 1457  BP: 120/88  Pulse: 85  Temp: 98.7 F (37.1 C)  TempSrc: Oral  Resp: 16  Height: 5' 11.75" (1.822 m)  Weight: 268 lb 3.2 oz (121.655 kg)  SpO2: 98%       Assessment & Plan:   Jordan Hines is a 64 y.o. male Prediabetes - Plan: POCT glucose (manual entry), POCT glycosylated hemoglobin (Hb A1C)  - stable. Continue to work on diet., exercise as below. Repeat testing in 3-6 months.   Depression - Plan: sertraline (ZOLOFT) 100 MG tablet  -stable. Continue same dose zoloft 100mg  qd.   Essential hypertension - Plan: metoprolol succinate (TOPROL-XL) 50 MG 24 hr tablet, lisinopril (PRINIVIL,ZESTRIL) 10 MG tablet  -stable, no med changes.   Obesity  -recommendations for portion control, avoidance of sugar containing beverages, decreasing alcohol use (call/RTC if difficulty cutting back), and decrease intake of fast food. Also discussed increasing physical activity as below.   Need for influenza vaccination - Plan: Flu Vaccine QUAD 36+ mos IM given.   Meds ordered this encounter  Medications  . sertraline (ZOLOFT) 100 MG tablet    Sig: Take 1 tablet (100 mg total) by mouth daily.    Dispense:  90 tablet    Refill:  1  . metoprolol succinate (TOPROL-XL) 50 MG 24 hr tablet    Sig: Take 1 tablet (50 mg total) by mouth daily. Take with or immediately following a meal.    Dispense:  90 tablet     Refill:  1  . lisinopril (PRINIVIL,ZESTRIL) 10 MG tablet    Sig: Take 1 tablet (10 mg total) by mouth daily.    Dispense:  90 tablet    Refill:  1   Patient Instructions  Decrease fast food to less than once per week, avoid sugar containing beverages including soda. No more than 12 ounces of soda per day for now, then continue to cut back. Water is best.  Exercise most days of the week - minimum of 150 minutes per week.  Cut back on alcohol as well. No ore than one drink per day.  If you feel you are addicted to alcohol or difficulty in decreasing your alcohol use - let me know.   Follow up in 3 months to determine how weight loss is going. Let me know if you have questions in the meantime.   Return to the clinic or go to the nearest emergency room if any of your symptoms worsen or new symptoms occur.  Influenza (Flu) Vaccine (Inactivated or Recombinant):  1. Why get vaccinated? Influenza ("flu") is a contagious disease that spreads around the [place] every year, usually between October and May. Flu is caused by influenza viruses, and is spread mainly by coughing, sneezing, and close contact. Anyone can get flu. Flu strikes suddenly and can last several days. Symptoms vary by age, but can include:  fever/chills  sore throat  muscle aches  fatigue  cough  headache  runny or stuffy nose Flu can also lead to pneumonia and blood infections, and cause diarrhea and seizures in children. If you have a medical condition, such as heart or lung disease, flu can make it worse. Flu is more dangerous for some people. Infants and young children, people 73 years of age and older, pregnant women, and people with certain health conditions or a weakened immune system are at greatest risk. Each year thousands of people in the [place] die from flu, and many more are hospitalized. Flu vaccine can:  keep you from getting flu,  make flu less severe if you do get it, and  keep you from  spreading flu to your family and other people. 2. Inactivated and recombinant flu vaccines A dose of flu vaccine is recommended every flu season. Children 6 months through 10 years of age may need two doses during the same flu season. Everyone else needs only one dose each flu season. Some inactivated flu vaccines contain a very small amount of a mercury-based preservative called thimerosal. Studies have not shown thimerosal in vaccines to be harmful, but flu vaccines that do not contain thimerosal are available. There is no live flu virus in flu shots. They cannot cause the flu. There are many flu viruses, and they are always changing. Each year a new flu vaccine is made to protect against three or four viruses that are likely to cause disease in the upcoming flu season. But even when the vaccine doesn't exactly match these viruses, it may still provide some protection. Flu vaccine cannot prevent:  flu that is caused by a virus not covered by the vaccine, or  illnesses that look like flu but are not. It takes about 2 weeks for protection to develop after vaccination, and protection lasts through the flu season. 3. Some people should not get this vaccine Tell the person who is giving you the vaccine:  If you have any severe, life-threatening allergies. If you ever had a life-threatening allergic reaction after a dose of flu vaccine, or have a severe allergy to any part of this vaccine, you may be advised not to get vaccinated. Most, but not all, types of flu vaccine contain a small amount of egg protein.  If you ever had Guillain-Barre Syndrome (also called GBS). Some people with a history of GBS should not get this vaccine. This should be discussed with your doctor.  If you are not feeling well. It is usually okay to get flu vaccine when you have a mild illness, but you might be asked to come back when you feel better. 4. Risks of a vaccine reaction With any  medicine, including vaccines, there  is a chance of reactions. These are usually mild and go away on their own, but serious reactions are also possible. Most people who get a flu shot do not have any problems with it. Minor problems following a flu shot include:  soreness, redness, or swelling where the shot was given  hoarseness  sore, red or itchy eyes  cough  fever  aches  headache  itching  fatigue If these problems occur, they usually begin soon after the shot and last 1 or 2 days. More serious problems following a flu shot can include the following:  There may be a small increased risk of Guillain-Barre Syndrome (GBS) after inactivated flu vaccine. This risk has been estimated at 1 or 2 additional cases per million people vaccinated. This is much lower than the risk of severe complications from flu, which can be prevented by flu vaccine.  Young children who get the flu shot along with pneumococcal vaccine (PCV13) and/or DTaP vaccine at the same time might be slightly more likely to have a seizure caused by fever. Ask your doctor for more information. Tell your doctor if a child who is getting flu vaccine has ever had a seizure. Problems that could happen after any injected vaccine:  People sometimes faint after a medical procedure, including vaccination. Sitting or lying down for about 15 minutes can help prevent fainting, and injuries caused by a fall. Tell your doctor if you feel dizzy, or have vision changes or ringing in the ears.  Some people get severe pain in the shoulder and have difficulty moving the arm where a shot was given. This happens very rarely.  Any medication can cause a severe allergic reaction. Such reactions from a vaccine are very rare, estimated at about 1 in a million doses, and would happen within a few minutes to a few hours after the vaccination. As with any medicine, there is a very remote chance of a vaccine causing a serious injury or death. The safety of vaccines is always being  monitored. For more information, visit: http://www.aguilar.org/ 5. What if there is a serious reaction? What should I look for?  Look for anything that concerns you, such as signs of a severe allergic reaction, very high fever, or unusual behavior. Signs of a severe allergic reaction can include hives, swelling of the face and throat, difficulty breathing, a fast heartbeat, dizziness, and weakness. These would start a few minutes to a few hours after the vaccination. What should I do?  If you think it is a severe allergic reaction or other emergency that can't wait, call 9-1-1 and get the person to the nearest hospital. Otherwise, call your doctor.  Reactions should be reported to the Vaccine Adverse Event Reporting System (VAERS). Your doctor should file this report, or you can do it yourself through the VAERS web site at www.vaers.SamedayNews.es, or by calling 4090497578. VAERS does not give medical advice. 6. The National Vaccine Injury Compensation Program The [employer] Vaccine Injury Compensation Program (VICP) is a federal program that was created to compensate people who may have been injured by certain vaccines. Persons who believe they may have been injured by a vaccine can learn about the program and about filing a claim by calling (773)276-8773 or visiting the [place] website at GoldCloset.com.ee. There is a time limit to file a claim for compensation. 7. How can I learn more?  Ask your healthcare provider. He or she can give you the vaccine package insert or suggest  other sources of information.  Call your local or state health department.  Contact the Centers for Disease Control and Prevention (CDC):  Call 9193702820 (1-800-CDC-INFO) or  Visit CDC's website at https://gibson.com/ Vaccine Information Statement Inactivated Influenza Vaccine (09/16/2013)   This information is not intended to replace advice given to you by your health care provider. Make sure you  discuss any questions you have with your health care provider.   Document Released: 11/21/2005 Document Revised: 02/17/2014 Document Reviewed: 09/19/2013 Elsevier Interactive Patient Education [employer].     I personally performed the services described in this documentation, which was scribed in my presence. The recorded information has been reviewed and considered, and addended by me as needed.    By signing my name below, I, Zola Button, attest that this documentation has been prepared under the direction and in the presence of Merri Ray, MD.  Electronically Signed: Zola Button, Medical Scribe. 12/11/2014. 3:33 PM.

## 2014-12-11 NOTE — Patient Instructions (Addendum)
Decrease fast food to less than once per week, avoid sugar containing beverages including soda. No more than 12 ounces of soda per day for now, then continue to cut back. Water is best.  Exercise most days of the week - minimum of 150 minutes per week.  Cut back on alcohol as well. No ore than one drink per day. If you feel you are addicted to alcohol or difficulty in decreasing your alcohol use - let me know.   Follow up in 3 months to determine how weight loss is going. Let me know if you have questions in the meantime.   Return to the clinic or go to the nearest emergency room if any of your symptoms worsen or new symptoms occur.  Influenza (Flu) Vaccine (Inactivated or Recombinant):  1. Why get vaccinated? Influenza ("flu") is a contagious disease that spreads around the [place] every year, usually between October and May. Flu is caused by influenza viruses, and is spread mainly by coughing, sneezing, and close contact. Anyone can get flu. Flu strikes suddenly and can last several days. Symptoms vary by age, but can include:  fever/chills  sore throat  muscle aches  fatigue  cough  headache  runny or stuffy nose Flu can also lead to pneumonia and blood infections, and cause diarrhea and seizures in children. If you have a medical condition, such as heart or lung disease, flu can make it worse. Flu is more dangerous for some people. Infants and young children, people 18 years of age and older, pregnant women, and people with certain health conditions or a weakened immune system are at greatest risk. Each year thousands of people in the [place] die from flu, and many more are hospitalized. Flu vaccine can:  keep you from getting flu,  make flu less severe if you do get it, and  keep you from spreading flu to your family and other people. 2. Inactivated and recombinant flu vaccines A dose of flu vaccine is recommended every flu season. Children 6 months through 78  years of age may need two doses during the same flu season. Everyone else needs only one dose each flu season. Some inactivated flu vaccines contain a very small amount of a mercury-based preservative called thimerosal. Studies have not shown thimerosal in vaccines to be harmful, but flu vaccines that do not contain thimerosal are available. There is no live flu virus in flu shots. They cannot cause the flu. There are many flu viruses, and they are always changing. Each year a new flu vaccine is made to protect against three or four viruses that are likely to cause disease in the upcoming flu season. But even when the vaccine doesn't exactly match these viruses, it may still provide some protection. Flu vaccine cannot prevent:  flu that is caused by a virus not covered by the vaccine, or  illnesses that look like flu but are not. It takes about 2 weeks for protection to develop after vaccination, and protection lasts through the flu season. 3. Some people should not get this vaccine Tell the person who is giving you the vaccine:  If you have any severe, life-threatening allergies. If you ever had a life-threatening allergic reaction after a dose of flu vaccine, or have a severe allergy to any part of this vaccine, you may be advised not to get vaccinated. Most, but not all, types of flu vaccine contain a small amount of egg protein.  If you ever had Guillain-Barre Syndrome (also  called GBS). Some people with a history of GBS should not get this vaccine. This should be discussed with your doctor.  If you are not feeling well. It is usually okay to get flu vaccine when you have a mild illness, but you might be asked to come back when you feel better. 4. Risks of a vaccine reaction With any medicine, including vaccines, there is a chance of reactions. These are usually mild and go away on their own, but serious reactions are also possible. Most people who get a flu shot do not have any problems with  it. Minor problems following a flu shot include:  soreness, redness, or swelling where the shot was given  hoarseness  sore, red or itchy eyes  cough  fever  aches  headache  itching  fatigue If these problems occur, they usually begin soon after the shot and last 1 or 2 days. More serious problems following a flu shot can include the following:  There may be a small increased risk of Guillain-Barre Syndrome (GBS) after inactivated flu vaccine. This risk has been estimated at 1 or 2 additional cases per million people vaccinated. This is much lower than the risk of severe complications from flu, which can be prevented by flu vaccine.  Young children who get the flu shot along with pneumococcal vaccine (PCV13) and/or DTaP vaccine at the same time might be slightly more likely to have a seizure caused by fever. Ask your doctor for more information. Tell your doctor if a child who is getting flu vaccine has ever had a seizure. Problems that could happen after any injected vaccine:  People sometimes faint after a medical procedure, including vaccination. Sitting or lying down for about 15 minutes can help prevent fainting, and injuries caused by a fall. Tell your doctor if you feel dizzy, or have vision changes or ringing in the ears.  Some people get severe pain in the shoulder and have difficulty moving the arm where a shot was given. This happens very rarely.  Any medication can cause a severe allergic reaction. Such reactions from a vaccine are very rare, estimated at about 1 in a million doses, and would happen within a few minutes to a few hours after the vaccination. As with any medicine, there is a very remote chance of a vaccine causing a serious injury or death. The safety of vaccines is always being monitored. For more information, visit: http://www.aguilar.org/ 5. What if there is a serious reaction? What should I look for?  Look for anything that concerns you, such  as signs of a severe allergic reaction, very high fever, or unusual behavior. Signs of a severe allergic reaction can include hives, swelling of the face and throat, difficulty breathing, a fast heartbeat, dizziness, and weakness. These would start a few minutes to a few hours after the vaccination. What should I do?  If you think it is a severe allergic reaction or other emergency that can't wait, call 9-1-1 and get the person to the nearest hospital. Otherwise, call your doctor.  Reactions should be reported to the Vaccine Adverse Event Reporting System (VAERS). Your doctor should file this report, or you can do it yourself through the VAERS web site at www.vaers.SamedayNews.es, or by calling (531)110-8800. VAERS does not give medical advice. 6. The National Vaccine Injury Compensation Program The [employer] Vaccine Injury Compensation Program (VICP) is a federal program that was created to compensate people who may have been injured by certain vaccines. Persons who believe  they may have been injured by a vaccine can learn about the program and about filing a claim by calling 419-101-4495 or visiting the [employer] website at GoldCloset.com.ee. There is a time limit to file a claim for compensation. 7. How can I learn more?  Ask your healthcare provider. He or she can give you the vaccine package insert or suggest other sources of information.  Call your local or state health department.  Contact the Centers for Disease Control and Prevention (CDC):  Call 218 292 8526 (1-800-CDC-INFO) or  Visit CDC's website at https://gibson.com/ Vaccine Information Statement Inactivated Influenza Vaccine (09/16/2013)   This information is not intended to replace advice given to you by your health care provider. Make sure you discuss any questions you have with your health care provider.   Document Released: 11/21/2005 Document Revised: 02/17/2014 Document Reviewed: 09/19/2013 Elsevier Interactive  Patient Education [employer].

## 2015-02-27 DIAGNOSIS — C61 Malignant neoplasm of prostate: Secondary | ICD-10-CM | POA: Diagnosis not present

## 2015-03-09 DIAGNOSIS — R300 Dysuria: Secondary | ICD-10-CM | POA: Diagnosis not present

## 2015-03-09 DIAGNOSIS — Z0000 Encounter for general adult medical examination without abnormal findings: Secondary | ICD-10-CM | POA: Diagnosis not present

## 2015-03-09 DIAGNOSIS — N3020 Other chronic cystitis without hematuria: Secondary | ICD-10-CM | POA: Diagnosis not present

## 2015-03-09 DIAGNOSIS — C61 Malignant neoplasm of prostate: Secondary | ICD-10-CM | POA: Diagnosis not present

## 2015-03-09 DIAGNOSIS — N5201 Erectile dysfunction due to arterial insufficiency: Secondary | ICD-10-CM | POA: Diagnosis not present

## 2015-03-12 ENCOUNTER — Encounter: Payer: Self-pay | Admitting: Family Medicine

## 2015-03-12 ENCOUNTER — Ambulatory Visit (INDEPENDENT_AMBULATORY_CARE_PROVIDER_SITE_OTHER): Payer: Commercial Managed Care - HMO | Admitting: Family Medicine

## 2015-03-12 VITALS — BP 126/90 | HR 81 | Temp 98.3°F | Resp 16 | Ht 71.75 in | Wt 269.4 lb

## 2015-03-12 DIAGNOSIS — Z789 Other specified health status: Secondary | ICD-10-CM

## 2015-03-12 DIAGNOSIS — I10 Essential (primary) hypertension: Secondary | ICD-10-CM

## 2015-03-12 DIAGNOSIS — R7303 Prediabetes: Secondary | ICD-10-CM | POA: Diagnosis not present

## 2015-03-12 DIAGNOSIS — F32A Depression, unspecified: Secondary | ICD-10-CM

## 2015-03-12 DIAGNOSIS — E669 Obesity, unspecified: Secondary | ICD-10-CM

## 2015-03-12 DIAGNOSIS — F329 Major depressive disorder, single episode, unspecified: Secondary | ICD-10-CM

## 2015-03-12 DIAGNOSIS — Z7289 Other problems related to lifestyle: Secondary | ICD-10-CM

## 2015-03-12 LAB — POCT GLYCOSYLATED HEMOGLOBIN (HGB A1C): HEMOGLOBIN A1C: 6.1

## 2015-03-12 LAB — GLUCOSE, POCT (MANUAL RESULT ENTRY): POC GLUCOSE: 103 mg/dL — AB (ref 70–99)

## 2015-03-12 MED ORDER — METOPROLOL SUCCINATE ER 50 MG PO TB24: 50.0000 mg | ORAL_TABLET | Freq: Every day | ORAL | Status: DC

## 2015-03-12 MED ORDER — SERTRALINE HCL 100 MG PO TABS: 100.0000 mg | ORAL_TABLET | Freq: Every day | ORAL | Status: DC

## 2015-03-12 MED ORDER — LISINOPRIL 10 MG PO TABS: 10.0000 mg | ORAL_TABLET | Freq: Every day | ORAL | Status: DC

## 2015-03-12 NOTE — Patient Instructions (Addendum)
Cut back to no more than 2 drinks of alcohol per day. If you have difficulty cutting back - let me know.  30 minutes of walking most days per week as a start to exercise.  Cut out sugar containing beverages and decrease portion size to help with weight and prediabetes. I will also refer you to a nutritionist as discussed.  If you are still fatigued during the day after urologist treats urinary symptoms and sleeping better through the night - return to discuss other causes.   Return to the clinic or go to the nearest emergency room if any of your symptoms worsen or new symptoms occur.   Hyperglycemia Hyperglycemia occurs when the glucose (sugar) in your blood is too high. Hyperglycemia can happen for many reasons, but it most often happens to people who do not know they have diabetes or are not managing their diabetes properly.  CAUSES  Whether you have diabetes or not, there are other causes of hyperglycemia. Hyperglycemia can occur when you have diabetes, but it can also occur in other situations that you might not be as aware of, such as: Diabetes  If you have diabetes and are having problems controlling your blood glucose, hyperglycemia could occur because of some of the following reasons:  Not following your meal plan.  Not taking your diabetes medications or not taking it properly.  Exercising less or doing less activity than you normally do.  Being sick. Pre-diabetes  This cannot be ignored. Before people develop Type 2 diabetes, they almost always have "pre-diabetes." This is when your blood glucose levels are higher than normal, but not yet high enough to be diagnosed as diabetes. Research has shown that some long-term damage to the body, especially the heart and circulatory system, may already be occurring during pre-diabetes. If you take action to manage your blood glucose when you have pre-diabetes, you may delay or prevent Type 2 diabetes from developing. Stress  If you have  diabetes, you may be "diet" controlled or on oral medications or insulin to control your diabetes. However, you may find that your blood glucose is higher than usual in the hospital whether you have diabetes or not. This is often referred to as "stress hyperglycemia." Stress can elevate your blood glucose. This happens because of hormones put out by the body during times of stress. If stress has been the cause of your high blood glucose, it can be followed regularly by your caregiver. That way he/she can make sure your hyperglycemia does not continue to get worse or progress to diabetes. Steroids  Steroids are medications that act on the infection fighting system (immune system) to block inflammation or infection. One side effect can be a rise in blood glucose. Most people can produce enough extra insulin to allow for this rise, but for those who cannot, steroids make blood glucose levels go even higher. It is not unusual for steroid treatments to "uncover" diabetes that is developing. It is not always possible to determine if the hyperglycemia will go away after the steroids are stopped. A special blood test called an A1c is sometimes done to determine if your blood glucose was elevated before the steroids were started. SYMPTOMS  Thirsty.  Frequent urination.  Dry mouth.  Blurred vision.  Tired or fatigue.  Weakness.  Sleepy.  Tingling in feet or leg. DIAGNOSIS  Diagnosis is made by monitoring blood glucose in one or all of the following ways:  A1c test. This is a chemical found in your  blood.  Fingerstick blood glucose monitoring.  Laboratory results. TREATMENT  First, knowing the cause of the hyperglycemia is important before the hyperglycemia can be treated. Treatment may include, but is not be limited to:  Education.  Change or adjustment in medications.  Change or adjustment in meal plan.  Treatment for an illness, infection, etc.  More frequent blood glucose  monitoring.  Change in exercise plan.  Decreasing or stopping steroids.  Lifestyle changes. HOME CARE INSTRUCTIONS   Test your blood glucose as directed.  Exercise regularly. Your caregiver will give you instructions about exercise. Pre-diabetes or diabetes which comes on with stress is helped by exercising.  Eat wholesome, balanced meals. Eat often and at regular, fixed times. Your caregiver or nutritionist will give you a meal plan to guide your sugar intake.  Being at an ideal weight is important. If needed, losing as little as 10 to 15 pounds may help improve blood glucose levels. SEEK MEDICAL CARE IF:   You have questions about medicine, activity, or diet.  You continue to have symptoms (problems such as increased thirst, urination, or weight gain). SEEK IMMEDIATE MEDICAL CARE IF:   You are vomiting or have diarrhea.  Your breath smells fruity.  You are breathing faster or slower.  You are very sleepy or incoherent.  You have numbness, tingling, or pain in your feet or hands.  You have chest pain.  Your symptoms get worse even though you have been following your caregiver's orders.  If you have any other questions or concerns.   This information is not intended to replace advice given to you by your health care provider. Make sure you discuss any questions you have with your health care provider.   Document Released: 07/23/2000 Document Revised: 04/21/2011 Document Reviewed: 10/03/2014 Elsevier Interactive Patient Education [employer].

## 2015-03-12 NOTE — Progress Notes (Signed)
Subjective:  By signing my name below, I, Moises Blood, attest that this documentation has been prepared under the direction and in the presence of Merri Ray, MD. Electronically Signed: Moises Blood, [place]. 03/12/2015 , 4:04 PM .  Patient was seen in Room 25 .   Patient ID: Jordan Hines, male    DOB: 03-Jun-1950, 65 y.o.   MRN: [id number] Chief Complaint  Patient presents with  . Follow-up    3 mos  . Hypertension  . Depression    "ongoing, no change"  . Obesity  . Prediabetes   HPI Jordan Hines is a 65 y.o. male Pt is here for multiple concerns today.   HTN Lab Results  Component Value Date   CREATININE 1.09 09/04/2014   CMP and lipid panel normal in July.   He is taking lisinopril 10 mg qd.  He is also taking toprol-XL 50 mg qd.   He saw his urologist 3 days ago and felt nausea. He felt some shortness of breath when he was talking to his Geophysicist/field seismologist; but this was resolved afterwards. He denies chest pain or shortness of breath at rest.   Nocturia He notes having nocturia ongoing for 3 months, discussed with his urologist.   Snoring He mentions snoring when he sleeps.   Depression Depression screen [facility] 2/9 03/12/2015 09/04/2014 07/17/2014  Decreased Interest 0 0 0  Down, Depressed, Hopeless 0 0 0  PHQ - 2 Score 0 0 0   He's been taking zoloft 100 mg qd, stable at last visit.  He's feeling normal. He still takes zoloft. He denies feeling down or depressed.   Weight Wt Readings from Last 3 Encounters:  03/12/15 269 lb 6.4 oz (122.199 kg)  12/11/14 268 lb 3.2 oz (121.655 kg)  09/04/14 260 lb (117.935 kg)   Recommended to work on diet and exercise and avoid sugar containing beverages as well as fast food. Additional to cut back on 1 drink of alcohol (16oz liquor a day, 4 days a week). He denies alcohol addiction or withdrawal symptoms at that time.   Diet He denies any changes in his diet. He's still drinking sugar-containing beverages.  He's  drinking a fifth of brandy, about 26oz, once a week. He's smoking cigars now.  He also informs feeling drowsy after eating a meal any time of the day.   Exercise He denies exercise outside of his job.   Pre-DM Lab Results  Component Value Date   HGBA1C 6.0 12/11/2014    There are no active problems to display for this patient.  Past Medical History  Diagnosis Date  . Hypertension   . Anxiety   . Arthritis   . Cancer ([place])   . Depression    Past Surgical History  Procedure Laterality Date  . Prostate surgery     No Known Allergies Prior to Admission medications   Medication Sig Start Date End Date Taking? Authorizing Provider  alprostadil (EDEX) 20 MCG injection 20 mcg by Intracavitary route as needed for erectile dysfunction. use no more than 3 times per week    Historical Provider, MD  lisinopril (PRINIVIL,ZESTRIL) 10 MG tablet Take 1 tablet (10 mg total) by mouth daily. 12/11/14   [name], MD  metoprolol succinate (TOPROL-XL) 50 MG 24 hr tablet Take 1 tablet (50 mg total) by mouth daily. Take with or immediately following a meal. 12/11/14   [name], MD  sertraline (ZOLOFT) 100 MG tablet Take 1 tablet (100 mg total)  by mouth daily. 12/11/14   [name], MD  zoster vaccine live, PF, (ZOSTAVAX) 16109 UNT/0.65ML injection Inject 19,400 Units into the skin once. Patient not taking: Reported on 12/11/2014 09/04/14   [name], MD   Social History   Social History  . Marital Status: Married    Spouse Name: N/A  . Number of Children: N/A  . Years of Education: N/A   Occupational History  . Not on file.   Social History Main Topics  . Smoking status: Current Every Day Smoker    Types: Cigars  . Smokeless tobacco: Not on file  . Alcohol Use: 4.2 oz/week    7 Cans of beer per week  . Drug Use: No  . Sexual Activity: Yes   Other Topics Concern  . Not on file   Social History Narrative   Review of Systems  Constitutional: Negative for  fatigue and unexpected weight change.  Eyes: Negative for visual disturbance.  Respiratory: Negative for cough, chest tightness and shortness of breath.   Cardiovascular: Negative for chest pain, palpitations and leg swelling.  Gastrointestinal: Negative for abdominal pain and blood in stool.  Neurological: Negative for dizziness, light-headedness and headaches.      Objective:   Physical Exam  Constitutional: He is oriented to person, place, and time. He appears well-developed and well-nourished.  HENT:  Head: Normocephalic and atraumatic.  Eyes: EOM are normal. Pupils are equal, round, and reactive to light.  Neck: No JVD present. Carotid bruit is not present.  Cardiovascular: Normal rate, regular rhythm and normal heart sounds.   No murmur heard. Pulmonary/Chest: Effort normal and breath sounds normal. He has no rales.  Musculoskeletal: He exhibits no edema.  Trace lower extremity edema  Neurological: He is alert and oriented to person, place, and time.  Skin: Skin is warm and dry.  Psychiatric: He has a normal mood and affect.  Vitals reviewed.   Filed Vitals:   03/12/15 1535 03/12/15 1538  BP: 120/90 126/90  Pulse: 81   Temp: 98.3 F (36.8 C)   TempSrc: Oral   Resp: 16   Height: 5' 11.75" (1.822 m)   Weight: 269 lb 6.4 oz (122.199 kg)   SpO2: 97%    Results for orders placed or performed in visit on 03/12/15  POCT glucose (manual entry)  Result Value Ref Range   POC Glucose 103 (A) 70 - 99 mg/dl  POCT glycosylated hemoglobin (Hb A1C)  Result Value Ref Range   Hemoglobin A1C 6.1       Assessment & Plan:   Jordan Hines is a 65 y.o. male Essential hypertension - Plan: COMPLETE METABOLIC PANEL WITH GFR, Lipid panel, metoprolol succinate (TOPROL-XL) 50 MG 24 hr tablet, lisinopril (PRINIVIL,ZESTRIL) 10 MG tablet  - stable.  Tolerating current regimen, denies missed doses. Labs pending above.   Prediabetes - Plan: POCT glucose (manual entry), POCT glycosylated  hemoglobin (Hb A1C), Amb ref to Medical Nutrition Therapy-MNT Obesity - Plan: Amb ref to Medical Nutrition Therapy-MNT  -stable A1c, but no significant diet or activity changes. Discussed course of prediabetes to diabetes and other health risks with obesity/diabetes if he does not lose weight or further gains.   -diet recommendations again given as below, increase walking (not just with typical walking at work), and refer to nutrition.   Alcohol use ([place]) - Plan: COMPLETE METABOLIC PANEL WITH GFR  -cutting back discussed again, denies addiction. Effect on weight and HTN discussed. He is to let me know if  unable to cut back.   Depression - Plan: sertraline (ZOLOFT) 100 MG tablet  -stable. Cont same dose Zoloft.   Follow up in 6 months.   Meds ordered this encounter  Medications  . metoprolol succinate (TOPROL-XL) 50 MG 24 hr tablet    Sig: Take 1 tablet (50 mg total) by mouth daily. Take with or immediately following a meal.    Dispense:  90 tablet    Refill:  1  . sertraline (ZOLOFT) 100 MG tablet    Sig: Take 1 tablet (100 mg total) by mouth daily.    Dispense:  90 tablet    Refill:  1  . lisinopril (PRINIVIL,ZESTRIL) 10 MG tablet    Sig: Take 1 tablet (10 mg total) by mouth daily.    Dispense:  90 tablet    Refill:  1   Patient Instructions  Cut back to no more than 2 drinks of alcohol per day. If you have difficulty cutting back - let me know.  30 minutes of walking most days per week as a start to exercise.  Cut out sugar containing beverages and decrease portion size to help with weight and prediabetes. I will also refer you to a nutritionist as discussed.  If you are still fatigued during the day after urologist treats urinary symptoms and sleeping better through the night - return to discuss other causes.   Return to the clinic or go to the nearest emergency room if any of your symptoms worsen or new symptoms occur.   Hyperglycemia Hyperglycemia occurs when the glucose  (sugar) in your blood is too high. Hyperglycemia can happen for many reasons, but it most often happens to people who do not know they have diabetes or are not managing their diabetes properly.  CAUSES  Whether you have diabetes or not, there are other causes of hyperglycemia. Hyperglycemia can occur when you have diabetes, but it can also occur in other situations that you might not be as aware of, such as: Diabetes  If you have diabetes and are having problems controlling your blood glucose, hyperglycemia could occur because of some of the following reasons:  Not following your meal plan.  Not taking your diabetes medications or not taking it properly.  Exercising less or doing less activity than you normally do.  Being sick. Pre-diabetes  This cannot be ignored. Before people develop Type 2 diabetes, they almost always have "pre-diabetes." This is when your blood glucose levels are higher than normal, but not yet high enough to be diagnosed as diabetes. Research has shown that some long-term damage to the body, especially the heart and circulatory system, may already be occurring during pre-diabetes. If you take action to manage your blood glucose when you have pre-diabetes, you may delay or prevent Type 2 diabetes from developing. Stress  If you have diabetes, you may be "diet" controlled or on oral medications or insulin to control your diabetes. However, you may find that your blood glucose is higher than usual in the hospital whether you have diabetes or not. This is often referred to as "stress hyperglycemia." Stress can elevate your blood glucose. This happens because of hormones put out by the body during times of stress. If stress has been the cause of your high blood glucose, it can be followed regularly by your caregiver. That way he/she can make sure your hyperglycemia does not continue to get worse or progress to diabetes. Steroids  Steroids are medications that act on the  infection fighting  system (immune system) to block inflammation or infection. One side effect can be a rise in blood glucose. Most people can produce enough extra insulin to allow for this rise, but for those who cannot, steroids make blood glucose levels go even higher. It is not unusual for steroid treatments to "uncover" diabetes that is developing. It is not always possible to determine if the hyperglycemia will go away after the steroids are stopped. A special blood test called an A1c is sometimes done to determine if your blood glucose was elevated before the steroids were started. SYMPTOMS  Thirsty.  Frequent urination.  Dry mouth.  Blurred vision.  Tired or fatigue.  Weakness.  Sleepy.  Tingling in feet or leg. DIAGNOSIS  Diagnosis is made by monitoring blood glucose in one or all of the following ways:  A1c test. This is a chemical found in your blood.  Fingerstick blood glucose monitoring.  Laboratory results. TREATMENT  First, knowing the cause of the hyperglycemia is important before the hyperglycemia can be treated. Treatment may include, but is not be limited to:  Education.  Change or adjustment in medications.  Change or adjustment in meal plan.  Treatment for an illness, infection, etc.  More frequent blood glucose monitoring.  Change in exercise plan.  Decreasing or stopping steroids.  Lifestyle changes. HOME CARE INSTRUCTIONS   Test your blood glucose as directed.  Exercise regularly. Your caregiver will give you instructions about exercise. Pre-diabetes or diabetes which comes on with stress is helped by exercising.  Eat wholesome, balanced meals. Eat often and at regular, fixed times. Your caregiver or nutritionist will give you a meal plan to guide your sugar intake.  Being at an ideal weight is important. If needed, losing as little as 10 to 15 pounds may help improve blood glucose levels. SEEK MEDICAL CARE IF:   You have questions about  medicine, activity, or diet.  You continue to have symptoms (problems such as increased thirst, urination, or weight gain). SEEK IMMEDIATE MEDICAL CARE IF:   You are vomiting or have diarrhea.  Your breath smells fruity.  You are breathing faster or slower.  You are very sleepy or incoherent.  You have numbness, tingling, or pain in your feet or hands.  You have chest pain.  Your symptoms get worse even though you have been following your caregiver's orders.  If you have any other questions or concerns.   This information is not intended to replace advice given to you by your health care provider. Make sure you discuss any questions you have with your health care provider.   Document Released: 07/23/2000 Document Revised: 04/21/2011 Document Reviewed: 10/03/2014 Elsevier Interactive Patient Education [employer].     I personally performed the services described in this documentation, which was scribed in my presence. The recorded information has been reviewed and considered, and addended by me as needed.

## 2015-03-14 LAB — COMPLETE METABOLIC PANEL WITH GFR
ALT: 19 U/L (ref 9–46)
AST: 16 U/L (ref 10–35)
Albumin: 4 g/dL (ref 3.6–5.1)
Alkaline Phosphatase: 77 U/L (ref 40–115)
BUN: 14 mg/dL (ref 7–25)
CALCIUM: 9.1 mg/dL (ref 8.6–10.3)
CHLORIDE: 102 mmol/L (ref 98–110)
CO2: 26 mmol/L (ref 20–31)
CREATININE: 1.04 mg/dL (ref 0.70–1.25)
GFR, Est African American: 86 mL/min (ref >=60)
GFR, Est Non African American: 74 mL/min (ref >=60)
GLUCOSE: 92 mg/dL (ref 65–99)
Potassium: 4.3 mmol/L (ref 3.5–5.3)
SODIUM: 139 mmol/L (ref 135–146)
Total Bilirubin: 0.8 mg/dL (ref 0.2–1.2)
Total Protein: 6.9 g/dL (ref 6.1–8.1)

## 2015-03-14 LAB — LIPID PANEL
CHOLESTEROL: 169 mg/dL (ref 125–200)
HDL: 63 mg/dL (ref >=40)
LDL CALC: 79 mg/dL (ref <130)
Total CHOL/HDL Ratio: 2.7 Ratio (ref <=5.0)
Triglycerides: 137 mg/dL (ref <150)
VLDL: 27 mg/dL (ref <30)

## 2015-03-16 DIAGNOSIS — Z0000 Encounter for general adult medical examination without abnormal findings: Secondary | ICD-10-CM | POA: Diagnosis not present

## 2015-04-02 ENCOUNTER — Encounter: Payer: Self-pay | Admitting: *Deleted

## 2015-08-29 ENCOUNTER — Ambulatory Visit: Payer: Commercial Managed Care - HMO | Admitting: Family Medicine

## 2015-08-30 ENCOUNTER — Ambulatory Visit: Payer: Commercial Managed Care - HMO | Admitting: Family Medicine

## 2015-09-03 DIAGNOSIS — C61 Malignant neoplasm of prostate: Secondary | ICD-10-CM | POA: Diagnosis not present

## 2015-09-06 ENCOUNTER — Telehealth: Payer: Self-pay

## 2015-09-06 NOTE — Telephone Encounter (Signed)
The patient called to request a new [employer] referral for a follow-up urology appointment with Dr Risa Grill.  The patient was originally referred by Dr Lou Miner in January, and the previous referral has expired.  A new referral has been placed for the patient.  The authorization is [id number].  Patient was notified via phone.

## 2015-09-10 DIAGNOSIS — N5201 Erectile dysfunction due to arterial insufficiency: Secondary | ICD-10-CM | POA: Diagnosis not present

## 2015-09-10 DIAGNOSIS — C61 Malignant neoplasm of prostate: Secondary | ICD-10-CM | POA: Diagnosis not present

## 2015-09-20 ENCOUNTER — Ambulatory Visit: Payer: Commercial Managed Care - HMO | Admitting: Family Medicine

## 2015-10-18 ENCOUNTER — Ambulatory Visit (INDEPENDENT_AMBULATORY_CARE_PROVIDER_SITE_OTHER): Payer: Commercial Managed Care - HMO | Admitting: Family Medicine

## 2015-10-18 ENCOUNTER — Encounter: Payer: Self-pay | Admitting: Family Medicine

## 2015-10-18 VITALS — BP 118/86 | HR 81 | Temp 97.8°F | Resp 16 | Ht 71.5 in | Wt 265.4 lb

## 2015-10-18 DIAGNOSIS — F329 Major depressive disorder, single episode, unspecified: Secondary | ICD-10-CM | POA: Diagnosis not present

## 2015-10-18 DIAGNOSIS — F32A Depression, unspecified: Secondary | ICD-10-CM

## 2015-10-18 DIAGNOSIS — Z789 Other specified health status: Secondary | ICD-10-CM

## 2015-10-18 DIAGNOSIS — R7303 Prediabetes: Secondary | ICD-10-CM | POA: Diagnosis not present

## 2015-10-18 DIAGNOSIS — I10 Essential (primary) hypertension: Secondary | ICD-10-CM

## 2015-10-18 DIAGNOSIS — E663 Overweight: Secondary | ICD-10-CM | POA: Diagnosis not present

## 2015-10-18 DIAGNOSIS — Z7289 Other problems related to lifestyle: Secondary | ICD-10-CM

## 2015-10-18 MED ORDER — METOPROLOL SUCCINATE ER 50 MG PO TB24: 50.0000 mg | ORAL_TABLET | Freq: Every day | ORAL | 1 refills | Status: DC

## 2015-10-18 MED ORDER — LISINOPRIL 10 MG PO TABS: 10.0000 mg | ORAL_TABLET | Freq: Every day | ORAL | 1 refills | Status: DC

## 2015-10-18 MED ORDER — SERTRALINE HCL 100 MG PO TABS: 100.0000 mg | ORAL_TABLET | Freq: Every day | ORAL | 1 refills | Status: DC

## 2015-10-18 NOTE — Progress Notes (Signed)
FOLL

## 2015-10-18 NOTE — Patient Instructions (Addendum)
  Call Hospice to see if counseling options may be helpful with coping with the loss of your brother in law. [place] may be able to provide some volunteer options.  [id number]  I would like you to cut back on drinking to no more than 1-2 drinks per day. If you have difficulty cutting back or feel that you are dependent on alcohol, return to discuss this with me so I can provide some resources to help. Continue Zoloft at same dose for right now.  Continue walking and other form of exercise every day as this can help with not only weight and blood sugar but also with depression. Recheck with me in the next 2-3 months, sooner if any worsening of your symptoms.  Continue same dose of blood pressure medications for now. I will check some electrolytes.    IF you received an x-ray today, you will receive an invoice from [facility]. Please contact [facility] at [phone] with questions or concerns regarding your invoice.   IF you received labwork today, you will receive an invoice from [employer]. Please contact [facility] at [phone] with questions or concerns regarding your invoice.   Our billing staff will not be able to assist you with questions regarding bills from these companies.  You will be contacted with the lab results as soon as they are available. The fastest way to get your results is to activate your My Chart account. Instructions are located on the last page of this paperwork. If you have not heard from [place] regarding the results in 2 weeks, please contact this office.

## 2015-10-18 NOTE — Progress Notes (Signed)
By signing my name below, I, Mesha Guinyard, attest that this documentation has been prepared under the direction and in the presence of Merri Ray.  Electronically Signed: Verlee Monte, Medical Scribe. 10/18/15. 4:14 PM.  Subjective:    Patient ID: Jordan Hines, male    DOB: May 26, 1950, 65 y.o.   MRN: [id number]  HPI Chief Complaint  Patient presents with  . Follow-up    per patient on WEIGHT    HPI Comments: Jordan Hines is a 65 y.o. male who presents to the [facility] for weight follow-up. Pt tracks the steps he takes.   PreDM/Obesity: We advised him in Jan to decrease alcohol to no more than 2 drinks a day, start walking more, decrease caloric beverages, as well as decrease portion size. Pt drinks about 3-4 times a day, pt will put a "small" amount, possibly a shot, of liquor in his cup and pour soda in the rest of his drink. Lab Results  Component Value Date   HGBA1C 6.1 03/12/2015   Body mass index is 36.5 kg/m. Wt Readings from Last 3 Encounters:  10/18/15 265 lb 6.4 oz (120.4 kg)  03/12/15 269 lb 6.4 oz (122.2 kg)  12/11/14 268 lb 3.2 oz (121.7 kg)   HTN: Takes Lisinopril and Metoprolol. Lab Results  Component Value Date   CREATININE 1.04 03/12/2015   Depression: Takes Zoloft 100 mg QD. Pt states he's been okay, but has been facing some life challenges such as financial troubles, and his divorce. Pt states most days he doesn't feel happy or sad. Pt doesn't do many things but eat a lot, visit family, attend a sports event here and there, as well as yard work. 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 Pt will come home and drink about 2-3 drinks when he gets home since theres nothing to do. Pt states he "doesn't need a home run to be happy" because he can be introverted. Pt will socialize with people if they are in front of him, and would  like to start volunteering. After this visit pt plans on getting a hair cut, go to the hoppers game down town, and drink a brandy when he gets home while watching TV. Pt lost his brother-in-law died last 19-Jun-2022 from a home invasion and had another family member pass away. Pt mentions he can't talk to his sister about the death of her deceased husband since he has to deal with his own "deamons" about the subject and it hurts him to see her in pain. Pt hasn't talked to anyone about the deaths he's experienced. Pt denies suicidal ideation, or thoughts of self harm.  Depression screen [facility] 2/9 10/18/2015 03/12/2015 09/04/2014 07/17/2014  Decreased Interest 0 0 0 0  Down, Depressed, Hopeless 0 0 0 0  PHQ - 2 Score 0 0 0 0   There are no active problems to display for this patient.  Past Medical History:  Diagnosis Date  . Anxiety   . Arthritis   . Cancer ([place])   . Depression   . Hypertension    Past Surgical History:  Procedure Laterality Date  . PROSTATE SURGERY     No Known Allergies Prior to Admission medications   Medication Sig Start Date End Date Taking? Authorizing Provider  lisinopril (PRINIVIL,ZESTRIL) 10 MG tablet Take 1 tablet (10 mg total)  by mouth daily. 03/12/15  Yes [name], MD  metoprolol succinate (TOPROL-XL) 50 MG 24 hr tablet Take 1 tablet (50 mg total) by mouth daily. Take with or immediately following a meal. 03/12/15  Yes [name], MD  sertraline (ZOLOFT) 100 MG tablet Take 1 tablet (100 mg total) by mouth daily. 03/12/15  Yes [name], MD  alprostadil (EDEX) 20 MCG injection 20 mcg by Intracavitary route as needed for erectile dysfunction. use no more than 3 times per week    Historical Provider, MD  zoster vaccine live, PF, (ZOSTAVAX) 60454 UNT/0.65ML injection Inject 19,400 Units into the skin once. Patient not taking: Reported on 12/11/2014 09/04/14   [name], MD   Social History   Social History  . Marital status: Married    Spouse name:  N/A  . Number of children: N/A  . Years of education: N/A   Occupational History  . Not on file.   Social History Main Topics  . Smoking status: Current Every Day Smoker    Types: Cigars  . Smokeless tobacco: Not on file  . Alcohol use 4.2 oz/week    7 Cans of beer per week  . Drug use: No  . Sexual activity: Yes   Other Topics Concern  . Not on file   Social History Narrative  . No narrative on file   Review of Systems  Psychiatric/Behavioral: Negative for dysphoric mood, self-injury and suicidal ideas.    Objective:  Physical Exam  Constitutional: He is oriented to person, place, and time. He appears well-developed and well-nourished.  HENT:  Head: Normocephalic and atraumatic.  Eyes: EOM are normal. Pupils are equal, round, and reactive to light.  Neck: No JVD present. Carotid bruit is not present.  Cardiovascular: Normal rate, regular rhythm and normal heart sounds.   No murmur heard. Pulmonary/Chest: Effort normal and breath sounds normal. He has no rales.  Musculoskeletal: He exhibits no edema.  Neurological: He is alert and oriented to person, place, and time.  Skin: Skin is warm and dry.  Psychiatric: He has a normal mood and affect.  Vitals reviewed.  BP 118/86 (BP Location: Right Arm, Patient Position: Sitting, Cuff Size: Large)   Pulse 81   Temp 97.8 F (36.6 C) (Oral)   Resp 16   Ht 5' 11.5" (1.816 m)   Wt 265 lb 6.4 oz (120.4 kg)   SpO2 97%   BMI 36.50 kg/m  Assessment & Plan:   Jordan Hines is a 65 y.o. male Depression - Plan: sertraline (ZOLOFT) 100 MG tablet  - decrease alcohol use, and to let me know if assistance needed.   -cont zoloft at same dose for now.   -hospice counseling  -recommended volunteering with his free time.   -recheck in 3 months.   Alcohol use ([facility])  -decrease intake discussed as above.   Prediabetes - Plan: Hemoglobin A1c  - repeat A1c. Diet and exercise discussed.   Essential hypertension - Plan: lisinopril  (PRINIVIL,ZESTRIL) 10 MG tablet, metoprolol succinate (TOPROL-XL) 50 MG 24 hr tablet, Basic metabolic panel  - decrease alcohol intake, exercise and diet changes for continued wt loss.   Overweight  - as above - exercise and diet changes discussed.   Recheck in 3 months. Sooner if worse.   Meds ordered this encounter  Medications  . lisinopril (PRINIVIL,ZESTRIL) 10 MG tablet    Sig: Take 1 tablet (10 mg total) by mouth daily.    Dispense:  90 tablet  Refill:  1  . metoprolol succinate (TOPROL-XL) 50 MG 24 hr tablet    Sig: Take 1 tablet (50 mg total) by mouth daily. Take with or immediately following a meal.    Dispense:  90 tablet    Refill:  1  . sertraline (ZOLOFT) 100 MG tablet    Sig: Take 1 tablet (100 mg total) by mouth daily.    Dispense:  90 tablet    Refill:  1   Patient Instructions    Call Hospice to see if counseling options may be helpful with coping with the loss of your brother in law. [place] may be able to provide some volunteer options.  [id number]  I would like you to cut back on drinking to no more than 1-2 drinks per day. If you have difficulty cutting back or feel that you are dependent on alcohol, return to discuss this with me so I can provide some resources to help. Continue Zoloft at same dose for right now.  Continue walking and other form of exercise every day as this can help with not only weight and blood sugar but also with depression. Recheck with me in the next 2-3 months, sooner if any worsening of your symptoms.  Continue same dose of blood pressure medications for now. I will check some electrolytes.    IF you received an x-ray today, you will receive an invoice from [facility]. Please contact [facility] at [phone] with questions or concerns regarding your invoice.   IF you received labwork today, you will receive an invoice from [employer]. Please contact [facility] at  [phone] with questions or concerns regarding your invoice.   Our billing staff will not be able to assist you with questions regarding bills from these companies.  You will be contacted with the lab results as soon as they are available. The fastest way to get your results is to activate your My Chart account. Instructions are located on the last page of this paperwork. If you have not heard from [place] regarding the results in 2 weeks, please contact this office.       I personally performed the services described in this documentation, which was scribed in my presence. The recorded information has been reviewed and considered, and addended by me as needed.   Signed,   Merri Ray, MD [facility].  10/20/15 11:13 PM

## 2015-10-19 LAB — BASIC METABOLIC PANEL
BUN: 16 mg/dL (ref 7–25)
CALCIUM: 9.5 mg/dL (ref 8.6–10.3)
CHLORIDE: 100 mmol/L (ref 98–110)
CO2: 27 mmol/L (ref 20–31)
Creat: 1.14 mg/dL (ref 0.70–1.25)
Glucose, Bld: 138 mg/dL — ABNORMAL HIGH (ref 65–99)
POTASSIUM: 4.3 mmol/L (ref 3.5–5.3)
SODIUM: 137 mmol/L (ref 135–146)

## 2015-10-19 LAB — HEMOGLOBIN A1C
HEMOGLOBIN A1C: 5.6 % (ref <5.7)
MEAN PLASMA GLUCOSE: 114 mg/dL

## 2015-11-02 ENCOUNTER — Encounter: Payer: Self-pay | Admitting: *Deleted

## 2015-12-25 DIAGNOSIS — H524 Presbyopia: Secondary | ICD-10-CM | POA: Diagnosis not present

## 2016-03-25 DIAGNOSIS — Z0100 Encounter for examination of eyes and vision without abnormal findings: Secondary | ICD-10-CM | POA: Diagnosis not present

## 2016-03-25 DIAGNOSIS — C61 Malignant neoplasm of prostate: Secondary | ICD-10-CM | POA: Diagnosis not present

## 2016-03-31 DIAGNOSIS — C61 Malignant neoplasm of prostate: Secondary | ICD-10-CM | POA: Diagnosis not present

## 2016-03-31 DIAGNOSIS — N5201 Erectile dysfunction due to arterial insufficiency: Secondary | ICD-10-CM | POA: Diagnosis not present

## 2016-07-10 ENCOUNTER — Telehealth: Payer: Self-pay | Admitting: Family Medicine

## 2016-07-10 DIAGNOSIS — I10 Essential (primary) hypertension: Secondary | ICD-10-CM

## 2016-07-10 NOTE — Telephone Encounter (Signed)
DR Carlota Raspberry PT WALKED INTO OUR OFFICE WANTING A REFILL ON LISINOPRIL METOPROLOL SERTRALINE PLEASE SEND THROUGH MAIL ORDER PT HAS SET UP AN APPOINTMENT WITH YOU

## 2016-07-11 MED ORDER — METOPROLOL SUCCINATE ER 50 MG PO TB24: 50.0000 mg | ORAL_TABLET | Freq: Every day | ORAL | 0 refills | Status: DC

## 2016-07-11 MED ORDER — SERTRALINE HCL 100 MG PO TABS: 100.0000 mg | ORAL_TABLET | Freq: Every day | ORAL | 0 refills | Status: DC

## 2016-07-11 MED ORDER — LISINOPRIL 10 MG PO TABS: 10.0000 mg | ORAL_TABLET | Freq: Every day | ORAL | 0 refills | Status: DC

## 2016-07-11 NOTE — Addendum Note (Signed)
Addended by: Virgia Land on: 07/11/2016 04:29 PM   Modules accepted: Orders

## 2016-07-15 ENCOUNTER — Ambulatory Visit (INDEPENDENT_AMBULATORY_CARE_PROVIDER_SITE_OTHER): Payer: Medicare HMO | Admitting: Family Medicine

## 2016-07-15 ENCOUNTER — Encounter: Payer: Self-pay | Admitting: Family Medicine

## 2016-07-15 VITALS — BP 121/77 | HR 74 | Temp 98.0°F | Resp 16 | Ht 71.5 in | Wt 270.2 lb

## 2016-07-15 DIAGNOSIS — Z6837 Body mass index (BMI) 37.0-37.9, adult: Secondary | ICD-10-CM

## 2016-07-15 DIAGNOSIS — I10 Essential (primary) hypertension: Secondary | ICD-10-CM

## 2016-07-15 DIAGNOSIS — F32A Depression, unspecified: Secondary | ICD-10-CM

## 2016-07-15 DIAGNOSIS — F329 Major depressive disorder, single episode, unspecified: Secondary | ICD-10-CM | POA: Diagnosis not present

## 2016-07-15 DIAGNOSIS — Z13220 Encounter for screening for lipoid disorders: Secondary | ICD-10-CM | POA: Diagnosis not present

## 2016-07-15 DIAGNOSIS — E669 Obesity, unspecified: Secondary | ICD-10-CM

## 2016-07-15 DIAGNOSIS — R5383 Other fatigue: Secondary | ICD-10-CM

## 2016-07-15 DIAGNOSIS — R7303 Prediabetes: Secondary | ICD-10-CM | POA: Diagnosis not present

## 2016-07-15 DIAGNOSIS — Z789 Other specified health status: Secondary | ICD-10-CM

## 2016-07-15 DIAGNOSIS — Z7289 Other problems related to lifestyle: Secondary | ICD-10-CM

## 2016-07-15 MED ORDER — SERTRALINE HCL 100 MG PO TABS: 100.0000 mg | ORAL_TABLET | Freq: Every day | ORAL | 0 refills | Status: DC

## 2016-07-15 MED ORDER — LISINOPRIL 10 MG PO TABS: 10.0000 mg | ORAL_TABLET | Freq: Every day | ORAL | 0 refills | Status: DC

## 2016-07-15 MED ORDER — METOPROLOL SUCCINATE ER 50 MG PO TB24: 50.0000 mg | ORAL_TABLET | Freq: Every day | ORAL | 0 refills | Status: DC

## 2016-07-15 NOTE — Progress Notes (Signed)
Subjective:  By signing my name below, I, Essence Howell, attest that this documentation has been prepared under the direction and in the presence of [name], MD Electronically Signed: Ladene Artist, ED Scribe 07/15/2016 at 2:47 PM.   Patient ID: Jordan Hines, male    DOB: Apr 17, 1950, 66 y.o.   MRN: [id number]  Chief Complaint  Patient presents with  . Follow-up    hypertension   HPI Jordan Hines is a 66 y.o. male who presents to Primary Care at [facility] for for follow-up regarding HTN, depression and pre-diabetes. Pt is fasting at this visit.   HTN Lab Results  Component Value Date   CREATININE 1.14 10/18/2015  Advised to decrease alcohol use in the past. Continued on Lisinopril 10 mg qd and Toprol 50 mg qd. Advised to decrease alcohol use again last visit. Pt states that he was having 3-6 alcoholic beverages a day prior to cutting back 2 days ago when he decreased his intake to 1 drink/day. States he typically consumes 20-25 beverages a week. Pt denies alcohol addiction; states he only drinks socially or when he winds down at night after work. He reports that he has gone a few days without drinking without any side-effects. Pt denies DUIs or interference at work. Pt also denies chest pain, sob, dizziness, light-headedness, HA, blood in stool, melena.   Depression Continued on Zoloft 100 mg qd. Recommended hospice counseling and volunteering in his free time. He states that his mood has been fine, however, he is running low on his Zoloft.   Pre-diabetes  Lab Results  Component Value Date   HGBA1C 5.6 10/18/2015  Pt states that he has been walking 3 days/week for exercise. He is consuming fast food 3 days/week and fried food 5 days/week. He reports having 2 sodas/daily and having a sweet tea occasionally. He has noticed that he has been feeling more fatigued with doing yard-work; states he has to take more breaks while mowing his yard. He denies chest pain or sob.   Wt  Readings from Last 3 Encounters:  07/15/16 270 lb 3.2 oz (122.6 kg)  10/18/15 265 lb 6.4 oz (120.4 kg)  03/12/15 269 lb 6.4 oz (122.2 kg)   There are no active problems to display for this patient.  Past Medical History:  Diagnosis Date  . Anxiety   . Arthritis   . Cancer ([place])   . Depression   . Hypertension    Past Surgical History:  Procedure Laterality Date  . PROSTATE SURGERY     No Known Allergies Prior to Admission medications   Medication Sig Start Date End Date Taking? Authorizing Provider  alprostadil (EDEX) 20 MCG injection 20 mcg by Intracavitary route as needed for erectile dysfunction. use no more than 3 times per week    [provider]  lisinopril (PRINIVIL,ZESTRIL) 10 MG tablet Take 1 tablet (10 mg total) by mouth daily. 07/11/16   [name], MD  metoprolol succinate (TOPROL-XL) 50 MG 24 hr tablet Take 1 tablet (50 mg total) by mouth daily. Take with or immediately following a meal. 07/11/16   [name], MD  sertraline (ZOLOFT) 100 MG tablet Take 1 tablet (100 mg total) by mouth daily. 07/11/16   [name], MD  zoster vaccine live, PF, (ZOSTAVAX) 45364 UNT/0.65ML injection Inject 19,400 Units into the skin once. Patient not taking: Reported on 12/11/2014 09/04/14   [name], MD   Social History   Social History  .  Marital status: Married    Spouse name: N/A  . Number of children: N/A  . Years of education: N/A   Occupational History  . Not on file.   Social History Main Topics  . Smoking status: Current Every Day Smoker    Types: Cigars  . Smokeless tobacco: Never Used  . Alcohol use 4.2 oz/week    7 Cans of beer per week  . Drug use: No  . Sexual activity: Yes   Other Topics Concern  . Not on file   Social History Narrative  . No narrative on file   Review of Systems  Constitutional: Positive for fatigue. Negative for unexpected weight change.  Eyes: Negative for visual disturbance.  Respiratory: Negative  for cough, chest tightness and shortness of breath.   Cardiovascular: Negative for chest pain, palpitations and leg swelling.  Gastrointestinal: Negative for abdominal pain and blood in stool.  Neurological: Negative for dizziness, light-headedness and headaches.  Psychiatric/Behavioral: Negative for dysphoric mood.      Objective:   Physical Exam  Constitutional: He is oriented to person, place, and time. He appears well-developed and well-nourished.  HENT:  Head: Normocephalic and atraumatic.  Eyes: EOM are normal. Pupils are equal, round, and reactive to light.  Neck: No JVD present. Carotid bruit is not present.  Cardiovascular: Normal rate, regular rhythm and normal heart sounds.   No murmur heard. Pulmonary/Chest: Effort normal and breath sounds normal. He has no rales.  Musculoskeletal: He exhibits no edema.  Neurological: He is alert and oriented to person, place, and time.  Skin: Skin is warm and dry.  Psychiatric: He has a normal mood and affect.  Vitals reviewed.  Vitals:   07/15/16 1414  BP: 121/77  Pulse: 74  Resp: 16  Temp: 98 F (36.7 C)  TempSrc: Oral  SpO2: 96%  Weight: 270 lb 3.2 oz (122.6 kg)  Height: 5' 11.5" (1.816 m)   EKG reading done by [name], MD: Sinus rhythm no acute findings.     Assessment & Plan:   Jordan IDROVO is a 66 y.o. male Essential hypertension - Plan: Comprehensive metabolic panel, lisinopril (PRINIVIL,ZESTRIL) 10 MG tablet, metoprolol succinate (TOPROL-XL) 50 MG 24 hr tablet, EKG 12-Lead  - Overall stable, continue same dose of lisinopril, Toprol. Check CMP. Stressed importance of decreased alcohol use and potentially decreasing other prescriptions with weight and alcohol decrease  Alcohol use - Plan: Comprehensive metabolic panel  -Persistent use. Advised on prior visits to cut back.  Health risks and effect on blood pressure and weight were again discussed. If he has difficulty cutting back, resources provided. Denies  addiction currently.  Class 2 obesity without serious comorbidity with body mass index (BMI) of 37.0 to 37.9 in adult, unspecified obesity type Prediabetes - Plan: Hemoglobin A1c  -Stressed importance of weight loss with diet changes. Initially most obvious change would be cutting back on fast food, fried food, sugar containing beverages, and increase in activity to most days per week. Could consider nutrition eval or bariatric specialist if persistent difficulty with weight loss based on his BMI.  Depression, unspecified depression type - Plan: sertraline (ZOLOFT) 100 MG tablet  -Overall stable on Zoloft 100 mg daily, continue same dose. Alcohol effect discuss. Cut back on alcohol as above.   Screening for hyperlipidemia - Plan: Lipid panel  Fatigue, unspecified type - Plan: EKG 12-Lead, CBC  -May be related to obesity/deconditioning. Advised if any chest pain or persistent fatigue with exertion, would recommend cardiology evaluation.  Meds ordered this encounter  Medications  . sertraline (ZOLOFT) 100 MG tablet    Sig: Take 1 tablet (100 mg total) by mouth daily.    Dispense:  90 tablet    Refill:  0  . lisinopril (PRINIVIL,ZESTRIL) 10 MG tablet    Sig: Take 1 tablet (10 mg total) by mouth daily.    Dispense:  90 tablet    Refill:  0  . metoprolol succinate (TOPROL-XL) 50 MG 24 hr tablet    Sig: Take 1 tablet (50 mg total) by mouth daily. Take with or immediately following a meal.    Dispense:  90 tablet    Refill:  0   Patient Instructions   Walk most if not all days per week, avoid fast food and fried food as much as possible.  Cut back on sodas as those are also making it difficult for you to lose weight. Water is best! As discussed in past, alcohol is also likely affecting your weight and blood pressure.  I am concerned about alcohol addiction. If you are having difficulty cutting back, see resources below.   I do not see any changes in her EKG today. If you have any  continued difficulty with energy or fatigue with activity, would recommend evaluation with cardiology for possible stress testing.  Return to the clinic or go to the nearest emergency room if any of your symptoms worsen or new symptoms occur.   [facility]  Address: [address], [place], [place] [address]  Hours:  Phone: [phone]   [facility]  Address: [address], [place], [place] [address] Phone: [phone]   IF you received an x-ray today, you will receive an invoice from [facility]. Please contact [facility] at [phone] with questions or concerns regarding your invoice.   IF you received labwork today, you will receive an invoice from [facility]. Please contact [facility] at [phone] with questions or concerns regarding your invoice.   Our billing staff will not be able to assist you with questions regarding bills from these companies.  You will be contacted with the lab results as soon as they are available. The fastest way to get your results is to activate your My Chart account. Instructions are located on the last page of this paperwork. If you have not heard from [place] regarding the results in 2 weeks, please contact this office.       I personally performed the services described in this documentation, which was scribed in my presence. The recorded information has been reviewed and considered for accuracy and completeness, addended by me as needed, and agree with information above.  Signed,   Merri Ray, MD [facility] at [place].  07/16/16 12:11 PM

## 2016-07-15 NOTE — Patient Instructions (Addendum)
Walk most if not all days per week, avoid fast food and fried food as much as possible.  Cut back on sodas as those are also making it difficult for you to lose weight. Water is best! As discussed in past, alcohol is also likely affecting your weight and blood pressure.  I am concerned about alcohol addiction. If you are having difficulty cutting back, see resources below.   I do not see any changes in her EKG today. If you have any continued difficulty with energy or fatigue with activity, would recommend evaluation with cardiology for possible stress testing.  Return to the clinic or go to the nearest emergency room if any of your symptoms worsen or new symptoms occur.   [facility]  Address: [address], [place], [place] [address]  Hours:  Phone: [phone]   [facility]  Address: [address], [place], [place] [address] Phone: [phone]   IF you received an x-ray today, you will receive an invoice from [facility] Rad[facility]. Please contact [facility] at [phone] with questions or concerns regarding your invoice.   IF you received labwork today, you will receive an invoice from [facility]. Please contact [facility] at [phone] with questions or concerns regarding your invoice.   Our billing staff will not be able to assist you with questions regarding bills from these companies.  You will be contacted with the lab results as soon as they are available. The fastest way to get your results is to activate your My Chart account. Instructions are located on the last page of this paperwork. If you have not heard from [place] regarding the results in 2 weeks, please contact this office.

## 2016-07-16 ENCOUNTER — Encounter: Payer: Self-pay | Admitting: Family Medicine

## 2016-07-16 LAB — CBC
HEMATOCRIT: 42 % (ref 37.5–51.0)
HEMOGLOBIN: 14.1 g/dL (ref 13.0–17.7)
MCH: 29.4 pg (ref 26.6–33.0)
MCHC: 33.6 g/dL (ref 31.5–35.7)
MCV: 88 fL (ref 79–97)
PLATELETS: 207 10*3/uL (ref 150–379)
RBC: 4.79 x10E6/uL (ref 4.14–5.80)
RDW: 15.2 % (ref 12.3–15.4)
WBC: 3.8 10*3/uL (ref 3.4–10.8)

## 2016-07-16 LAB — COMPREHENSIVE METABOLIC PANEL WITH GFR
ALT: 27 [IU]/L (ref 0–44)
AST: 26 [IU]/L (ref 0–40)
Albumin/Globulin Ratio: 1.8 (ref 1.2–2.2)
Albumin: 4.5 g/dL (ref 3.6–4.8)
Alkaline Phosphatase: 81 [IU]/L (ref 39–117)
BUN/Creatinine Ratio: 15 (ref 10–24)
BUN: 15 mg/dL (ref 8–27)
Bilirubin Total: 0.6 mg/dL (ref 0.0–1.2)
CO2: 26 mmol/L (ref 18–29)
Calcium: 9.6 mg/dL (ref 8.6–10.2)
Chloride: 101 mmol/L (ref 96–106)
Creatinine, Ser: 1 mg/dL (ref 0.76–1.27)
GFR calc Af Amer: 90 mL/min/{1.73_m2}
GFR calc non Af Amer: 78 mL/min/{1.73_m2}
Globulin, Total: 2.5 g/dL (ref 1.5–4.5)
Glucose: 90 mg/dL (ref 65–99)
Potassium: 4.6 mmol/L (ref 3.5–5.2)
Sodium: 141 mmol/L (ref 134–144)
Total Protein: 7 g/dL (ref 6.0–8.5)

## 2016-07-16 LAB — LIPID PANEL
CHOLESTEROL TOTAL: 188 mg/dL (ref 100–199)
Chol/HDL Ratio: 2.8 ratio (ref 0.0–5.0)
HDL: 66 mg/dL (ref >39)
LDL Calculated: 100 mg/dL — ABNORMAL HIGH (ref 0–99)
TRIGLYCERIDES: 110 mg/dL (ref 0–149)
VLDL Cholesterol Cal: 22 mg/dL (ref 5–40)

## 2016-07-16 LAB — HEMOGLOBIN A1C
Est. average glucose Bld gHb Est-mCnc: 126 mg/dL
Hgb A1c MFr Bld: 6 % — ABNORMAL HIGH (ref 4.8–5.6)

## 2016-09-29 DIAGNOSIS — C61 Malignant neoplasm of prostate: Secondary | ICD-10-CM | POA: Diagnosis not present

## 2016-10-17 ENCOUNTER — Telehealth: Payer: Self-pay | Admitting: General Practice

## 2016-10-17 NOTE — Telephone Encounter (Signed)
I left a message asking the patient to call and schedule AWV. Jordan Hines

## 2017-01-02 ENCOUNTER — Other Ambulatory Visit: Payer: Self-pay | Admitting: Family Medicine

## 2017-01-02 DIAGNOSIS — F32A Depression, unspecified: Secondary | ICD-10-CM

## 2017-01-02 DIAGNOSIS — F329 Major depressive disorder, single episode, unspecified: Secondary | ICD-10-CM

## 2017-01-05 NOTE — Telephone Encounter (Signed)
Rx refill, needs appt.   See where a message was left for him to call and schedule an appt

## 2017-02-04 DIAGNOSIS — H524 Presbyopia: Secondary | ICD-10-CM | POA: Diagnosis not present

## 2017-02-06 ENCOUNTER — Other Ambulatory Visit: Payer: Self-pay | Admitting: Family Medicine

## 2017-02-06 DIAGNOSIS — I10 Essential (primary) hypertension: Secondary | ICD-10-CM

## 2017-02-07 ENCOUNTER — Encounter: Payer: Self-pay | Admitting: Physician Assistant

## 2017-02-07 ENCOUNTER — Other Ambulatory Visit: Payer: Self-pay

## 2017-02-07 ENCOUNTER — Ambulatory Visit: Payer: Medicare HMO | Admitting: Physician Assistant

## 2017-02-07 VITALS — BP 125/84 | HR 90 | Temp 98.5°F | Resp 16 | Ht 71.0 in | Wt 271.2 lb

## 2017-02-07 DIAGNOSIS — R82998 Other abnormal findings in urine: Secondary | ICD-10-CM

## 2017-02-07 DIAGNOSIS — R197 Diarrhea, unspecified: Secondary | ICD-10-CM | POA: Diagnosis not present

## 2017-02-07 LAB — POCT CBC
Granulocyte percent: 53.2 %G (ref 37–80)
HCT, POC: 44.7 % (ref 43.5–53.7)
HEMOGLOBIN: 14.4 g/dL (ref 14.1–18.1)
Lymph, poc: 2 (ref 0.6–3.4)
MCH: 29.2 pg (ref 27–31.2)
MCHC: 32.4 g/dL (ref 31.8–35.4)
MCV: 90.4 fL (ref 80–97)
MID (cbc): 0.4 (ref 0–0.9)
MPV: 7.9 fL (ref 0–99.8)
PLATELET COUNT, POC: 274 10*3/uL (ref 142–424)
POC Granulocyte: 2.7 (ref 2–6.9)
POC LYMPH PERCENT: 39.3 %L (ref 10–50)
POC MID %: 7.5 % (ref 0–12)
RBC: 4.94 M/uL (ref 4.69–6.13)
RDW, POC: 15.3 %
WBC: 5 10*3/uL (ref 4.6–10.2)

## 2017-02-07 LAB — POCT URINALYSIS DIP (MANUAL ENTRY)
BILIRUBIN UA: NEGATIVE
BILIRUBIN UA: NEGATIVE mg/dL
GLUCOSE UA: NEGATIVE mg/dL
Nitrite, UA: POSITIVE — AB
Protein Ur, POC: NEGATIVE mg/dL
SPEC GRAV UA: 1.02 (ref 1.010–1.025)
Urobilinogen, UA: 0.2 E.U./dL
pH, UA: 5.5 (ref 5.0–8.0)

## 2017-02-07 LAB — POC MICROSCOPIC URINALYSIS (UMFC): MUCUS RE: ABSENT

## 2017-02-07 MED ORDER — METRONIDAZOLE 500 MG PO TABS: 500.0000 mg | ORAL_TABLET | Freq: Three times a day (TID) | ORAL | 0 refills | Status: DC

## 2017-02-07 MED ORDER — CIPROFLOXACIN HCL 500 MG PO TABS: 500.0000 mg | ORAL_TABLET | Freq: Two times a day (BID) | ORAL | 0 refills | Status: AC

## 2017-02-07 NOTE — Patient Instructions (Addendum)
I want to see you back in about 1 week to recheck your urine and talk about your symtpoms.     IF you received an x-ray today, you will receive an invoice from [facility]. Please contact [facility]iology at [phone] with questions or concerns regarding your invoice.   IF you received labwork today, you will receive an invoice from [facility]. Please contact [facility] at [phone] with questions or concerns regarding your invoice.   Our billing staff will not be able to assist you with questions regarding bills from these companies.  You will be contacted with the lab results as soon as they are available. The fastest way to get your results is to activate your My Chart account. Instructions are located on the last page of this paperwork. If you have not heard from [place] regarding the results in 2 weeks, please contact this office.

## 2017-02-07 NOTE — Progress Notes (Signed)
  02/09/2017 10:23 AM   DOB: 05/06/1950 / MRN: [id number]  SUBJECTIVE:  Jordan Hines is a 66 y.o. male presenting for diarrhea check.   Has been present now for about 10 days and tells me there is an odd smell to the diarrhea.  Stopped taking a penicillin for a dental abscess about 6 days ago and tells me he still has some pain in his mouth from the procedure.  He denies a history of C-diff.  Denies any persistent abdominal pain but will often have a cramping sensation in the lower abdomen.  No fever.   In addition to the lower abdominal pain he tells me he has had some burning with urination along with foul smelling urine.  He is not circumcised and has had prostate instrumentation in the past.   He has No Known Allergies.   He  has a past medical history of Anxiety, Arthritis, Cancer ([facility]), Depression, and Hypertension.    He  reports that he has been smoking cigars.  he has never used smokeless tobacco. He reports that he drinks about 4.2 oz of alcohol per week. He reports that he does not use drugs. He  reports that he currently engages in sexual activity. The patient  has a past surgical history that includes Prostate surgery.  His family history includes Cancer in his father; Diabetes in his father; Heart disease in his mother.  Review of Systems  Constitutional: Negative for chills, diaphoresis and fever.  Respiratory: Negative for cough, hemoptysis, sputum production, shortness of breath and wheezing.   Cardiovascular: Negative for chest pain, orthopnea and leg swelling.  Gastrointestinal: Positive for abdominal pain and diarrhea. Negative for blood in stool, constipation, heartburn, melena, nausea and vomiting.  Genitourinary: Negative for flank pain.  Skin: Negative for rash.  Neurological: Negative for dizziness.    The problem list and medications were reviewed and updated by myself where necessary and exist elsewhere in the encounter.   OBJECTIVE:  BP 125/84   Pulse  90   Temp 98.5 F (36.9 C) (Oral)   Resp 16   Ht 5' 11" (1.803 m)   Wt 271 lb 3.2 oz (123 kg)   SpO2 100%   BMI 37.82 kg/m   Wt Readings from Last 3 Encounters:  02/07/17 271 lb 3.2 oz (123 kg)  07/15/16 270 lb 3.2 oz (122.6 kg)  10/18/15 265 lb 6.4 oz (120.4 kg)   Temp Readings from Last 3 Encounters:  02/07/17 98.5 F (36.9 C) (Oral)  07/15/16 98 F (36.7 C) (Oral)  10/18/15 97.8 F (36.6 C) (Oral)   BP Readings from Last 3 Encounters:  02/07/17 125/84  07/15/16 121/77  10/18/15 118/86   Pulse Readings from Last 3 Encounters:  02/07/17 90  07/15/16 74  10/18/15 81     Physical Exam  Constitutional: He appears well-developed. He is active and cooperative.  Non-toxic appearance.  Cardiovascular: Normal rate, regular rhythm, S1 normal, S2 normal, normal heart sounds, intact distal pulses and normal pulses. Exam reveals no gallop and no friction rub.  No murmur heard. Pulmonary/Chest: Effort normal. No stridor. No tachypnea. No respiratory distress. He has no wheezes. He has no rales.  Abdominal: Soft. Bowel sounds are normal. He exhibits no distension and no mass. There is no tenderness. There is no rebound and no guarding.  Musculoskeletal: He exhibits no edema.  Neurological: He is alert.  Skin: Skin is warm and dry. He is not diaphoretic. No pallor.  Vitals reviewed.     Results for orders placed or performed in visit on 02/07/17 (from the past 72 hour(s))  POCT CBC     Status: None   Collection Time: 02/07/17  3:07 PM  Result Value Ref Range   WBC 5.0 4.6 - 10.2 K/uL   Lymph, poc 2.0 0.6 - 3.4   POC LYMPH PERCENT 39.3 10 - 50 %L   MID (cbc) 0.4 0 - 0.9   POC MID % 7.5 0 - 12 %M   POC Granulocyte 2.7 2 - 6.9   Granulocyte percent 53.2 37 - 80 %G   RBC 4.94 4.69 - 6.13 M/uL   Hemoglobin 14.4 14.1 - 18.1 g/dL   HCT, POC 44.7 43.5 - 53.7 %   MCV 90.4 80 - 97 fL   MCH, POC 29.2 27 - 31.2 pg   MCHC 32.4 31.8 - 35.4 g/dL   RDW, POC 15.3 %   Platelet Count,  POC 274 142 - 424 K/uL   MPV 7.9 0 - 99.8 fL  POCT urinalysis dipstick     Status: Abnormal   Collection Time: 02/07/17  3:08 PM  Result Value Ref Range   Color, UA yellow yellow   Clarity, UA cloudy (A) clear   Glucose, UA negative negative mg/dL   Bilirubin, UA negative negative   Ketones, POC UA negative negative mg/dL   Spec Grav, UA 1.020 1.010 - 1.025   Blood, UA trace-intact (A) negative   pH, UA 5.5 5.0 - 8.0   Protein Ur, POC negative negative mg/dL   Urobilinogen, UA 0.2 0.2 or 1.0 E.U./dL   Nitrite, UA Positive (A) Negative   Leukocytes, UA Moderate (2+) (A) Negative  POCT Microscopic Urinalysis ([facility])     Status: Abnormal   Collection Time: 02/07/17  3:16 PM  Result Value Ref Range   WBC,UR,HPF,POC Too numerous to count  (A) None WBC/hpf   RBC,UR,HPF,POC Few (A) None RBC/hpf   Bacteria Many (A) None, Too numerous to count   Mucus Absent Absent   Epithelial Cells, UR Per Microscopy Few (A) None, Too numerous to count cells/hpf  PSA     Status: None   Collection Time: 02/07/17  3:28 PM  Result Value Ref Range   Prostate Specific Ag, Serum 0.3 0.0 - 4.0 ng/mL    Comment: Roche ECLIA methodology. According to the American Urological Association, Serum PSA should decrease and remain at undetectable levels after radical prostatectomy. The AUA defines biochemical recurrence as an initial PSA value 0.2 ng/mL or greater followed by a subsequent confirmatory PSA value 0.2 ng/mL or greater. Values obtained with different assay methods or kits cannot be used interchangeably. Results cannot be interpreted as absolute evidence of the presence or absence of malignant disease.   CMP14+EGFR     Status: None   Collection Time: 02/07/17  3:28 PM  Result Value Ref Range   Glucose 87 65 - 99 mg/dL   BUN 15 8 - 27 mg/dL   Creatinine, Ser 1.20 0.76 - 1.27 mg/dL   GFR calc non Af Amer 62 >59 mL/min/1.73   GFR calc Af Amer 71 >59 mL/min/1.73   BUN/Creatinine Ratio 13 10 - 24    Sodium 141 134 - 144 mmol/L   Potassium 4.7 3.5 - 5.2 mmol/L   Chloride 98 96 - 106 mmol/L   CO2 21 20 - 29 mmol/L   Calcium 9.9 8.6 - 10.2 mg/dL   Total Protein 7.7 6.0 - 8.5 g/dL   Albumin 4.6 3.6 - 4.8 g/dL  Globulin, Total 3.1 1.5 - 4.5 g/dL   Albumin/Globulin Ratio 1.5 1.2 - 2.2   Bilirubin Total 0.4 0.0 - 1.2 mg/dL   Alkaline Phosphatase 91 39 - 117 IU/L   AST 32 0 - 40 IU/L   ALT 32 0 - 44 IU/L  Urine Culture     Status: Abnormal (Preliminary result)   Collection Time: 02/07/17  3:30 PM  Result Value Ref Range   Urine Culture, Routine Preliminary report (A)    Organism ID, Bacteria Escherichia coli (A)     Comment: 50,000-100,000 colony forming units per mL   No results found.  ASSESSMENT AND PLAN:  Mainor was seen today for abdominal pain.  Diagnoses and all orders for this visit:  Diarrhea of presumed infectious origin: He most like has C diff complicated by a UTI (urine checked twice to ensure a clean catch.)  Will treat with cipro-flagyl for now and will hopefully be able to stop one or both abx given stool PCR and urine culture.  -     POCT urinalysis dipstick -     GI Profile, Stool, PCR -     POCT CBC -     metroNIDAZOLE (FLAGYL) 500 MG tablet; Take 1 tablet (500 mg total) by mouth 3 (three) times daily. DO NOT CONSUME ALCOHOL WHILE TAKING THIS MEDICATION. -     CMP14+EGFR  Leukocytes in urine -     POCT Microscopic Urinalysis ([facility]) -     Urine Culture -     PSA    The patient is advised to call or return to clinic if he does not see an improvement in symptoms, or to seek the care of the closest emergency department if he worsens with the above plan.   Philis Fendt, MHS, PA-C [facility] 02/09/2017 10:23 AM

## 2017-02-08 LAB — CMP14+EGFR
ALBUMIN: 4.6 g/dL (ref 3.6–4.8)
ALT: 32 IU/L (ref 0–44)
AST: 32 IU/L (ref 0–40)
Albumin/Globulin Ratio: 1.5 (ref 1.2–2.2)
Alkaline Phosphatase: 91 IU/L (ref 39–117)
BUN / CREAT RATIO: 13 (ref 10–24)
BUN: 15 mg/dL (ref 8–27)
Bilirubin Total: 0.4 mg/dL (ref 0.0–1.2)
CALCIUM: 9.9 mg/dL (ref 8.6–10.2)
CO2: 21 mmol/L (ref 20–29)
CREATININE: 1.2 mg/dL (ref 0.76–1.27)
Chloride: 98 mmol/L (ref 96–106)
GFR, EST AFRICAN AMERICAN: 71 mL/min/{1.73_m2} (ref >59)
GFR, EST NON AFRICAN AMERICAN: 62 mL/min/{1.73_m2} (ref >59)
GLOBULIN, TOTAL: 3.1 g/dL (ref 1.5–4.5)
GLUCOSE: 87 mg/dL (ref 65–99)
Potassium: 4.7 mmol/L (ref 3.5–5.2)
SODIUM: 141 mmol/L (ref 134–144)
TOTAL PROTEIN: 7.7 g/dL (ref 6.0–8.5)

## 2017-02-08 LAB — PSA: Prostate Specific Ag, Serum: 0.3 ng/mL (ref 0.0–4.0)

## 2017-02-10 LAB — GI PROFILE, STOOL, PCR
Adenovirus F 40/41: NOT DETECTED
Astrovirus: NOT DETECTED
C DIFFICILE TOXIN A/B: NOT DETECTED
CAMPYLOBACTER: NOT DETECTED
CRYPTOSPORIDIUM: NOT DETECTED
Cyclospora cayetanensis: NOT DETECTED
ENTEROAGGREGATIVE E COLI: NOT DETECTED
ENTEROTOXIGENIC E COLI: NOT DETECTED
Entamoeba histolytica: NOT DETECTED
Enteropathogenic E coli: NOT DETECTED
GIARDIA LAMBLIA: NOT DETECTED
NOROVIRUS GI/GII: NOT DETECTED
PLESIOMONAS SHIGELLOIDES: NOT DETECTED
ROTAVIRUS A: NOT DETECTED
SHIGA-TOXIN-PRODUCING E COLI: NOT DETECTED
SHIGELLA/ENTEROINVASIVE E COLI: NOT DETECTED
Salmonella: NOT DETECTED
Sapovirus: NOT DETECTED
Vibrio cholerae: NOT DETECTED
Vibrio: NOT DETECTED
YERSINIA ENTEROCOLITICA: NOT DETECTED

## 2017-02-11 ENCOUNTER — Telehealth: Payer: Self-pay

## 2017-02-11 LAB — URINE CULTURE

## 2017-02-11 NOTE — Telephone Encounter (Signed)
Call placed to patient per provider request regarding lab results. No answer on patient phone, left message for patient to return call./ S.Harrell Gave, CMA

## 2017-02-14 ENCOUNTER — Ambulatory Visit (INDEPENDENT_AMBULATORY_CARE_PROVIDER_SITE_OTHER): Payer: Medicare HMO | Admitting: Physician Assistant

## 2017-02-14 ENCOUNTER — Other Ambulatory Visit: Payer: Self-pay

## 2017-02-14 ENCOUNTER — Encounter: Payer: Self-pay | Admitting: Physician Assistant

## 2017-02-14 VITALS — BP 106/70 | HR 88 | Temp 98.6°F | Resp 16 | Ht 71.5 in | Wt 270.2 lb

## 2017-02-14 DIAGNOSIS — N3000 Acute cystitis without hematuria: Secondary | ICD-10-CM

## 2017-02-14 DIAGNOSIS — H5213 Myopia, bilateral: Secondary | ICD-10-CM | POA: Diagnosis not present

## 2017-02-14 DIAGNOSIS — H52209 Unspecified astigmatism, unspecified eye: Secondary | ICD-10-CM | POA: Diagnosis not present

## 2017-02-14 DIAGNOSIS — H524 Presbyopia: Secondary | ICD-10-CM | POA: Diagnosis not present

## 2017-02-14 LAB — POCT URINALYSIS DIP (MANUAL ENTRY)
BILIRUBIN UA: NEGATIVE mg/dL
Bilirubin, UA: NEGATIVE
Blood, UA: NEGATIVE
Glucose, UA: NEGATIVE mg/dL
Nitrite, UA: NEGATIVE
PROTEIN UA: NEGATIVE mg/dL
SPEC GRAV UA: 1.02 (ref 1.010–1.025)
Urobilinogen, UA: 0.2 E.U./dL
pH, UA: 5.5 (ref 5.0–8.0)

## 2017-02-14 LAB — POC MICROSCOPIC URINALYSIS (UMFC): MUCUS RE: ABSENT

## 2017-02-14 NOTE — Progress Notes (Signed)
PRIMARY CARE AT [facility] [address] [place] [address] [phone]  Date:  02/14/2017   Name:  Jordan Hines   DOB:  04/21/1950   MRN:  [id number]  PCP:  Patient, No Pcp Per    History of Present Illness:  Jordan Hines is a 67 y.o. male patient who presents to PCP with  Chief Complaint  Patient presents with  . Follow-up    stomach and urine infection     Patient was seen here 1 week ago for abdominal pain, diarrhea, and possible urinary tract infection. Patient was treated with metronidazole and ciprofloxacin.  He has been compliant with the medication.  Urine culture revealed e coli.  Advised to stop metronidazole, but he states that he has not been notified. The diarrhea has resolved.  No dysuria, hematuria, or frequency. No fever.    There are no active problems to display for this patient.   Past Medical History:  Diagnosis Date  . Anxiety   . Arthritis   . Cancer ([place])   . Depression   . Hypertension     Past Surgical History:  Procedure Laterality Date  . PROSTATE SURGERY      Social History   Tobacco Use  . Smoking status: Current Every Day Smoker    Types: Cigars  . Smokeless tobacco: Never Used  Substance Use Topics  . Alcohol use: Yes    Alcohol/week: 4.2 oz    Types: 7 Cans of beer per week  . Drug use: No    Family History  Problem Relation Age of Onset  . Heart disease Mother   . Cancer Father   . Diabetes Father     Not on File  Medication list has been reviewed and updated.  Current Outpatient Medications on File Prior to Visit  Medication Sig Dispense Refill  . lisinopril (PRINIVIL,ZESTRIL) 10 MG tablet TAKE 1 TABLET (10 MG TOTAL) BY MOUTH DAILY. 30 tablet 0  . metoprolol succinate (TOPROL-XL) 50 MG 24 hr tablet TAKE 1 TABLET (50 MG TOTAL) BY MOUTH DAILY. TAKE WITH OR IMMEDIATELY FOLLOWING A MEAL. 30 tablet 0  . metroNIDAZOLE (FLAGYL) 500 MG tablet Take 1 tablet (500 mg total) by mouth 3 (three) times daily. DO NOT  CONSUME ALCOHOL WHILE TAKING THIS MEDICATION. 30 tablet 0  . sertraline (ZOLOFT) 100 MG tablet Take 1 tablet (100 mg total) by mouth daily. Needs office visit 30 tablet 0   No current facility-administered medications on file prior to visit.     ROS ROS otherwise unremarkable unless listed above.  Physical Examination: BP 106/70 (BP Location: Right Arm, Patient Position: Sitting, Cuff Size: Large)   Pulse 88   Temp 98.6 F (37 C) (Oral)   Resp 16   Ht 5' 11.5" (1.816 m)   Wt 270 lb 3.2 oz (122.6 kg)   SpO2 96%   BMI 37.16 kg/m  Ideal Body Weight: Weight in (lb) to have BMI = 25: 181.4  Physical Exam  Constitutional: He is oriented to person, place, and time. He appears well-developed and well-nourished. No distress.  HENT:  Head: Normocephalic and atraumatic.  Eyes: Conjunctivae and EOM are normal. Pupils are equal, round, and reactive to light.  Cardiovascular: Normal rate.  Pulmonary/Chest: Effort normal. No respiratory distress.  Abdominal: Soft. Normal appearance and bowel sounds are normal. There is no tenderness.  Neurological: He is alert and oriented to person, place, and time.  Skin: Skin is warm and dry. He is not diaphoretic.  Psychiatric: He has a normal mood and affect. His behavior is normal.   Results for orders placed or performed in visit on 02/14/17  POCT urinalysis dipstick  Result Value Ref Range   Color, UA yellow yellow   Clarity, UA clear clear   Glucose, UA negative negative mg/dL   Bilirubin, UA negative negative   Ketones, POC UA negative negative mg/dL   Spec Grav, UA 1.020 1.010 - 1.025   Blood, UA negative negative   pH, UA 5.5 5.0 - 8.0   Protein Ur, POC negative negative mg/dL   Urobilinogen, UA 0.2 0.2 or 1.0 E.U./dL   Nitrite, UA Negative Negative   Leukocytes, UA Trace (A) Negative  POCT Microscopic Urinalysis ([facility])  Result Value Ref Range   WBC,UR,HPF,POC Few (A) None WBC/hpf   RBC,UR,HPF,POC None None RBC/hpf   Bacteria None  None, Too numerous to count   Mucus Absent Absent   Epithelial Cells, UR Per Microscopy None None, Too numerous to count cells/hpf    Assessment and Plan: Jordan Hines is a 67 y.o. male who is here today for cc of  Chief Complaint  Patient presents with  . Follow-up    stomach and urine infection  this appears to have been clearing.  Advised to stop the metronidazole.   Acute cystitis without hematuria - Plan: POCT urinalysis dipstick, POCT Microscopic Urinalysis ([facility])  [name], PA-C [facility] 1/12/20197:46 PM

## 2017-02-14 NOTE — Patient Instructions (Addendum)
  I am obtaining a micro-urinalysis.  I will contact you with that result.   You can stop the metronidazole at this time.    IF you received an x-ray today, you will receive an invoice from [facility]. Please contact [facility] at [phone] with questions or concerns regarding your invoice.   IF you received labwork today, you will receive an invoice from [place]. Please contact [facility] at [phone] with questions or concerns regarding your invoice.   Our billing staff will not be able to assist you with questions regarding bills from these companies.  You will be contacted with the lab results as soon as they are available. The fastest way to get your results is to activate your My Chart account. Instructions are located on the last page of this paperwork. If you have not heard from [place] regarding the results in 2 weeks, please contact this office.

## 2017-03-23 ENCOUNTER — Other Ambulatory Visit: Payer: Self-pay | Admitting: Family Medicine

## 2017-03-23 ENCOUNTER — Telehealth: Payer: Self-pay

## 2017-03-23 ENCOUNTER — Telehealth: Payer: Self-pay | Admitting: Family Medicine

## 2017-03-23 DIAGNOSIS — I10 Essential (primary) hypertension: Secondary | ICD-10-CM

## 2017-03-23 DIAGNOSIS — F329 Major depressive disorder, single episode, unspecified: Secondary | ICD-10-CM

## 2017-03-23 DIAGNOSIS — F32A Depression, unspecified: Secondary | ICD-10-CM

## 2017-03-23 NOTE — Telephone Encounter (Signed)
See other telephone note.  

## 2017-03-23 NOTE — Telephone Encounter (Signed)
Presented to office with concerns of medication refills. Only received 30 day supply on most recent refill. Did note on the prescription the need to schedule office visit as overdue (last visit with me, 6 month follow-up was recommended for December 2018)- office visit needed to discuss medications. He plans to schedule office visit in the next few weeks, does have 30 day supply on order. Understanding expressed of plan.

## 2017-03-23 NOTE — Telephone Encounter (Signed)
Pt stated he was given a 30 day supply on his medications and he had to pay out of pocket. He stated his insurance will pay if he gets a 90 days supply. Please Adivse pt is here

## 2017-03-24 ENCOUNTER — Ambulatory Visit (INDEPENDENT_AMBULATORY_CARE_PROVIDER_SITE_OTHER): Payer: Medicare HMO | Admitting: Family Medicine

## 2017-03-24 ENCOUNTER — Encounter: Payer: Self-pay | Admitting: Family Medicine

## 2017-03-24 ENCOUNTER — Other Ambulatory Visit: Payer: Self-pay

## 2017-03-24 VITALS — BP 118/80 | HR 72 | Temp 98.1°F | Resp 18 | Ht 71.5 in | Wt 273.6 lb

## 2017-03-24 DIAGNOSIS — Z6837 Body mass index (BMI) 37.0-37.9, adult: Secondary | ICD-10-CM

## 2017-03-24 DIAGNOSIS — R7303 Prediabetes: Secondary | ICD-10-CM | POA: Diagnosis not present

## 2017-03-24 DIAGNOSIS — F329 Major depressive disorder, single episode, unspecified: Secondary | ICD-10-CM | POA: Diagnosis not present

## 2017-03-24 DIAGNOSIS — I10 Essential (primary) hypertension: Secondary | ICD-10-CM

## 2017-03-24 DIAGNOSIS — Z13220 Encounter for screening for lipoid disorders: Secondary | ICD-10-CM

## 2017-03-24 DIAGNOSIS — E669 Obesity, unspecified: Secondary | ICD-10-CM

## 2017-03-24 DIAGNOSIS — F32A Depression, unspecified: Secondary | ICD-10-CM

## 2017-03-24 MED ORDER — LISINOPRIL 10 MG PO TABS: ORAL_TABLET | ORAL | 2 refills | Status: DC

## 2017-03-24 MED ORDER — SERTRALINE HCL 50 MG PO TABS
50.0000 mg | ORAL_TABLET | Freq: Every day | ORAL | 2 refills | Status: DC
Start: 2017-03-24 — End: 2018-02-04

## 2017-03-24 MED ORDER — METOPROLOL SUCCINATE ER 50 MG PO TB24: ORAL_TABLET | ORAL | 2 refills | Status: DC

## 2017-03-24 NOTE — Patient Instructions (Addendum)
Ok to try lower dose of Zoloft. If any worsening depression or anxiety symptoms, may need to increase back to 100mg .  Make sure to follow up in 6 months to discuss that med.   No change in blood pressure meds for now.   For weight loss, decrease sodas - water is best. Decrease portion size at dinner and eat breakfast and lunch each day - healthy options. Avoid fried food and fast food. Recheck in 6 months. You were prediabetes before - I will recheck those tests today. See diet recommendations below.   If energy levels are not improving with diet changes and change in Zoloft dose, please return to discuss further.   Prediabetes Eating Plan Prediabetes-also called impaired glucose tolerance or impaired fasting glucose-is a condition that causes blood sugar (blood glucose) levels to be higher than normal. Following a healthy diet can help to keep prediabetes under control. It can also help to lower the risk of type 2 diabetes and heart disease, which are increased in people who have prediabetes. Along with regular exercise, a healthy diet:  Promotes weight loss.  Helps to control blood sugar levels.  Helps to improve the way that the body uses insulin.  What do I need to know about this eating plan?  Use the glycemic index (GI) to plan your meals. The index tells you how quickly a food will raise your blood sugar. Choose low-GI foods. These foods take a longer time to raise blood sugar.  Pay close attention to the amount of carbohydrates in the food that you eat. Carbohydrates increase blood sugar levels.  Keep track of how many calories you take in. Eating the right amount of calories will help you to achieve a healthy weight. Losing about 7 percent of your starting weight can help to prevent type 2 diabetes.  You may want to follow a Mediterranean diet. This diet includes a lot of vegetables, lean meats or fish, whole grains, fruits, and healthy oils and fats. What foods can I  eat? Grains Whole grains, such as whole-wheat or whole-grain breads, crackers, cereals, and pasta. Unsweetened oatmeal. Bulgur. Barley. Quinoa. Brown rice. Corn or whole-wheat flour tortillas or taco shells. Vegetables Lettuce. Spinach. Peas. Beets. Cauliflower. Cabbage. Broccoli. Carrots. Tomatoes. Squash. Eggplant. Herbs. Peppers. Onions. Cucumbers. Brussels sprouts. Fruits Berries. Bananas. Apples. Oranges. Grapes. Papaya. Mango. Pomegranate. Kiwi. Grapefruit. Cherries. Meats and Other Protein Sources Seafood. Lean meats, such as chicken and [place] or lean cuts of pork and beef. Tofu. Eggs. Nuts. Beans. Dairy Low-fat or fat-free dairy products, such as yogurt, cottage cheese, and cheese. Beverages Water. Tea. Coffee. Sugar-free or diet soda. Seltzer water. Milk. Milk alternatives, such as soy or almond milk. Condiments Mustard. Relish. Low-fat, low-sugar ketchup. Low-fat, low-sugar barbecue sauce. Low-fat or fat-free mayonnaise. Sweets and Desserts Sugar-free or low-fat pudding. Sugar-free or low-fat ice cream and other frozen treats. Fats and Oils Avocado. Walnuts. Olive oil. The items listed above may not be a complete list of recommended foods or beverages. Contact your dietitian for more options. What foods are not recommended? Grains Refined white flour and flour products, such as bread, pasta, snack foods, and cereals. Beverages Sweetened drinks, such as sweet iced tea and soda. Sweets and Desserts Baked goods, such as cake, cupcakes, pastries, cookies, and cheesecake. The items listed above may not be a complete list of foods and beverages to avoid. Contact your dietitian for more information. This information is not intended to replace advice given to you by your health care provider.  Make sure you discuss any questions you have with your health care provider. Document Released: 06/13/2014 Document Revised: 07/05/2015 Document Reviewed: 02/22/2014 Elsevier Interactive Patient  Education  2017 [employer].      IF you received an x-ray today, you will receive an invoice from [facility]. Please contact [facility] at [phone] with questions or concerns regarding your invoice.   IF you received labwork today, you will receive an invoice from [place]. Please contact [facility] at [phone] with questions or concerns regarding your invoice.   Our billing staff will not be able to assist you with questions regarding bills from these companies.  You will be contacted with the lab results as soon as they are available. The fastest way to get your results is to activate your My Chart account. Instructions are located on the last page of this paperwork. If you have not heard from [place] regarding the results in 2 weeks, please contact this office.

## 2017-03-24 NOTE — Progress Notes (Signed)
Subjective:  This chart was scribed for [name], MD by Tamsen Roers, at [place] at [facility]  This patient was seen in room 1 and the patient's care was started at 12:10 PM.   Chief Complaint  Patient presents with  . Hypertension  . Depression    Depression scale score 6  . Follow-up     Patient ID: Jordan Hines, male    DOB: Jun 28, 1950, 67 y.o.   MRN: [id number]  HPI HPI Comments: Jordan Hines is a 67 y.o. male who presents to Primary Care at [facility] for a follow up of hypertension and depression.   Hypertension: He is on Lisinopril 10 mg QD and Toprol 50 mg QD.  He was also drinking 95-09 alcoholic drinks per week when discussed in June 2018.  Denied addiction, denied DUI or work issues. --- He has been compliant with his medications and denies any side effects. Patient has been getting BP readings around 120-125/80 when he does check it (periodically).  He is currently drinking 10 alcoholic drinks per week.  Lab Results  Component Value Date   CREATININE 1.20 02/07/2017    Depression: Zoloft 100 mg QD--- Patient is compliant with this medication and denies any side effects.  He does not currently feel depressed but does feel more fatigued at times.   Depression screen [facility] 2/9 03/24/2017 03/24/2017 02/14/2017 02/07/2017 10/18/2015  Decreased Interest 1 0 0 0 0  Down, Depressed, Hopeless 0 0 0 0 0  PHQ - 2 Score 1 0 0 0 0  Altered sleeping 1 - - - -  Tired, decreased energy 3 - - - -  Change in appetite 1 - - - -  Feeling bad or failure about yourself  0 - - - -  Trouble concentrating 0 - - - -  Moving slowly or fidgety/restless 0 - - - -  Suicidal thoughts 0 - - - -  PHQ-9 Score 6 - - - -  Difficult doing work/chores Not difficult at all - - - -    Tobacco abuse: Patient is a cigar smoker. --- Patient smokes small cigars-2 per day and large ones- every third day.     Obesity/Pre diabetes:Patient feels that he cant lose weight easily and has to  nap right after eating a meal. He does drink soda (coke) or beer with his meals and only eats once per day (dinner).  He denies snacking and does eat fast food.  He is walking 5 days per week.  Wt Readings from Last 3 Encounters:  03/24/17 273 lb 9.6 oz (124.1 kg)  02/14/17 270 lb 3.2 oz (122.6 kg)  02/07/17 271 lb 3.2 oz (123 kg)   Body mass index is 37.63 kg/m.  Lab Results  Component Value Date   HGBA1C 6.0 (H) 07/15/2016   There are no active problems to display for this patient.  Past Medical History:  Diagnosis Date  . Anxiety   . Arthritis   . Cancer ([place])   . Depression   . Hypertension    Past Surgical History:  Procedure Laterality Date  . PROSTATE SURGERY     No Known Allergies Prior to Admission medications   Medication Sig Start Date End Date Taking? Authorizing Provider  lisinopril (PRINIVIL,ZESTRIL) 10 MG tablet TAKE 1 TABLET EVERY DAY  (NEED OFFICE VISIT FOR ADDITIONAL REFILLS) 03/24/17  Yes [name], MD  metoprolol succinate (TOPROL-XL) 50 MG 24 hr tablet TAKE 1 TABLET (50 MG) DAILY WITH  OR IMMEDIATELY FOLLOWING A MEAL  (NEED OFFICE VISIT FOR ADDITIONAL REFILLS0 03/24/17  Yes [name], MD  sertraline (ZOLOFT) 100 MG tablet TAKE 1 TABLET (100 MG TOTAL) BY MOUTH DAILY (NEEDS OFFICE VISIT) 03/24/17  Yes [name], MD  metroNIDAZOLE (FLAGYL) 500 MG tablet Take 1 tablet (500 mg total) by mouth 3 (three) times daily. DO NOT CONSUME ALCOHOL WHILE TAKING THIS MEDICATION. Patient not taking: Reported on 03/24/2017 02/07/17   Tereasa Coop, PA-C   Social History   Socioeconomic History  . Marital status: Married    Spouse name: Not on file  . Number of children: Not on file  . Years of education: Not on file  . Highest education level: Not on file  Social Needs  . Financial resource strain: Not on file  . Food insecurity - worry: Not on file  . Food insecurity - inability: Not on file  . Transportation needs - medical: Not on file  .  Transportation needs - non-medical: Not on file  Occupational History  . Not on file  Tobacco Use  . Smoking status: Current Every Day Smoker    Types: Cigars  . Smokeless tobacco: Never Used  Substance and Sexual Activity  . Alcohol use: Yes    Alcohol/week: 4.2 oz    Types: 7 Cans of beer per week  . Drug use: No  . Sexual activity: Yes  Other Topics Concern  . Not on file  Social History Narrative  . Not on file      Review of Systems  Constitutional: Positive for fatigue. Negative for chills and fever.  Eyes: Negative for pain and redness.  Respiratory: Negative for cough and shortness of breath.   Gastrointestinal: Negative for nausea and vomiting.  Musculoskeletal: Negative for neck pain and neck stiffness.  Neurological: Negative for syncope and speech difficulty.       Objective:   Physical Exam  Constitutional: He is oriented to person, place, and time. He appears well-developed and well-nourished.  HENT:  Head: Normocephalic and atraumatic.  Eyes: EOM are normal. Pupils are equal, round, and reactive to light.  Neck: No JVD present. Carotid bruit is not present.  Cardiovascular: Normal rate, regular rhythm and normal heart sounds.  No murmur heard. Pulmonary/Chest: Effort normal and breath sounds normal. He has no rales.  Musculoskeletal: He exhibits no edema.  Neurological: He is alert and oriented to person, place, and time.  Skin: Skin is warm and dry.  Psychiatric: He has a normal mood and affect.  Vitals reviewed.   Vitals:   03/24/17 1157  BP: 118/80  Pulse: 72  Resp: 18  Temp: 98.1 F (36.7 C)  TempSrc: Oral  SpO2: 97%  Weight: 273 lb 9.6 oz (124.1 kg)  Height: 5' 11.5" (1.816 m)         Assessment & Plan:   Jordan Hines is a 67 y.o. male Essential hypertension - Plan: Comprehensive metabolic panel, metoprolol succinate (TOPROL-XL) 50 MG 24 hr tablet, lisinopril (PRINIVIL,ZESTRIL) 10 MG tablet  - stable. Continue lisinopril,  toprol same dose, labs pending.   Depression, unspecified depression type - Plan: sertraline (ZOLOFT) 50 MG tablet  - stable. Continue same dose Zoloft.   Prediabetes - Plan: Hemoglobin A1c Class 2 obesity with body mass index (BMI) of 37.0 to 37.9 in adult, unspecified obesity type, unspecified whether serious comorbidity present  - healthier food options and physical activity discussed.   - anticipate improved energy with these changes. rtc if  fatigue persists.   Screening for hyperlipidemia - Plan: Lipid panel   Meds ordered this encounter  Medications  . metoprolol succinate (TOPROL-XL) 50 MG 24 hr tablet    Sig: TAKE 1 TABLET (50 MG) DAILY WITH OR IMMEDIATELY FOLLOWING A MEAL    Dispense:  90 tablet    Refill:  2  . lisinopril (PRINIVIL,ZESTRIL) 10 MG tablet    Sig: TAKE 1 TABLET EVERY DAY    Dispense:  90 tablet    Refill:  2  . sertraline (ZOLOFT) 50 MG tablet    Sig: Take 1 tablet (50 mg total) by mouth daily.    Dispense:  90 tablet    Refill:  2   Patient Instructions   Ok to try lower dose of Zoloft. If any worsening depression or anxiety symptoms, may need to increase back to 100mg .  Make sure to follow up in 6 months to discuss that med.   No change in blood pressure meds for now.   For weight loss, decrease sodas - water is best. Decrease portion size at dinner and eat breakfast and lunch each day - healthy options. Avoid fried food and fast food. Recheck in 6 months. You were prediabetes before - I will recheck those tests today. See diet recommendations below.   If energy levels are not improving with diet changes and change in Zoloft dose, please return to discuss further.   Prediabetes Eating Plan Prediabetes-also called impaired glucose tolerance or impaired fasting glucose-is a condition that causes blood sugar (blood glucose) levels to be higher than normal. Following a healthy diet can help to keep prediabetes under control. It can also help to lower the  risk of type 2 diabetes and heart disease, which are increased in people who have prediabetes. Along with regular exercise, a healthy diet:  Promotes weight loss.  Helps to control blood sugar levels.  Helps to improve the way that the body uses insulin.  What do I need to know about this eating plan?  Use the glycemic index (GI) to plan your meals. The index tells you how quickly a food will raise your blood sugar. Choose low-GI foods. These foods take a longer time to raise blood sugar.  Pay close attention to the amount of carbohydrates in the food that you eat. Carbohydrates increase blood sugar levels.  Keep track of how many calories you take in. Eating the right amount of calories will help you to achieve a healthy weight. Losing about 7 percent of your starting weight can help to prevent type 2 diabetes.  You may want to follow a Mediterranean diet. This diet includes a lot of vegetables, lean meats or fish, whole grains, fruits, and healthy oils and fats. What foods can I eat? Grains Whole grains, such as whole-wheat or whole-grain breads, crackers, cereals, and pasta. Unsweetened oatmeal. Bulgur. Barley. Quinoa. Brown rice. Corn or whole-wheat flour tortillas or taco shells. Vegetables Lettuce. Spinach. Peas. Beets. Cauliflower. Cabbage. Broccoli. Carrots. Tomatoes. Squash. Eggplant. Herbs. Peppers. Onions. Cucumbers. Brussels sprouts. Fruits Berries. Bananas. Apples. Oranges. Grapes. Papaya. Mango. Pomegranate. Kiwi. Grapefruit. Cherries. Meats and Other Protein Sources Seafood. Lean meats, such as chicken and [place] or lean cuts of pork and beef. Tofu. Eggs. Nuts. Beans. Dairy Low-fat or fat-free dairy products, such as yogurt, cottage cheese, and cheese. Beverages Water. Tea. Coffee. Sugar-free or diet soda. Seltzer water. Milk. Milk alternatives, such as soy or almond milk. Condiments Mustard. Relish. Low-fat, low-sugar ketchup. Low-fat, low-sugar barbecue sauce. Low-fat  or  fat-free mayonnaise. Sweets and Desserts Sugar-free or low-fat pudding. Sugar-free or low-fat ice cream and other frozen treats. Fats and Oils Avocado. Walnuts. Olive oil. The items listed above may not be a complete list of recommended foods or beverages. Contact your dietitian for more options. What foods are not recommended? Grains Refined white flour and flour products, such as bread, pasta, snack foods, and cereals. Beverages Sweetened drinks, such as sweet iced tea and soda. Sweets and Desserts Baked goods, such as cake, cupcakes, pastries, cookies, and cheesecake. The items listed above may not be a complete list of foods and beverages to avoid. Contact your dietitian for more information. This information is not intended to replace advice given to you by your health care provider. Make sure you discuss any questions you have with your health care provider. Document Released: 06/13/2014 Document Revised: 07/05/2015 Document Reviewed: 02/22/2014 Elsevier Interactive Patient Education  2017 [employer].      IF you received an x-ray today, you will receive an invoice from [facility]. Please contact [facility] at [phone] with questions or concerns regarding your invoice.   IF you received labwork today, you will receive an invoice from [place]. Please contact [facility] at [phone] with questions or concerns regarding your invoice.   Our billing staff will not be able to assist you with questions regarding bills from these companies.  You will be contacted with the lab results as soon as they are available. The fastest way to get your results is to activate your My Chart account. Instructions are located on the last page of this paperwork. If you have not heard from [place] regarding the results in 2 weeks, please contact this office.      I personally performed the services described in this documentation, which was scribed in my presence. The  recorded information has been reviewed and considered for accuracy and completeness, addended by me as needed, and agree with information above.  Signed,   Merri Ray, MD Primary Care at [place].  03/26/17 10:36 PM

## 2017-03-25 LAB — LIPID PANEL
Chol/HDL Ratio: 3 ratio (ref 0.0–5.0)
Cholesterol, Total: 187 mg/dL (ref 100–199)
HDL: 62 mg/dL (ref >39)
LDL Calculated: 100 mg/dL — ABNORMAL HIGH (ref 0–99)
Triglycerides: 124 mg/dL (ref 0–149)
VLDL CHOLESTEROL CAL: 25 mg/dL (ref 5–40)

## 2017-03-25 LAB — COMPREHENSIVE METABOLIC PANEL
ALK PHOS: 89 IU/L (ref 39–117)
ALT: 28 IU/L (ref 0–44)
AST: 21 IU/L (ref 0–40)
Albumin/Globulin Ratio: 1.5 (ref 1.2–2.2)
Albumin: 4.3 g/dL (ref 3.6–4.8)
BUN/Creatinine Ratio: 15 (ref 10–24)
BUN: 15 mg/dL (ref 8–27)
Bilirubin Total: 0.7 mg/dL (ref 0.0–1.2)
CO2: 26 mmol/L (ref 20–29)
CREATININE: 1.03 mg/dL (ref 0.76–1.27)
Calcium: 9.2 mg/dL (ref 8.6–10.2)
Chloride: 102 mmol/L (ref 96–106)
GFR calc Af Amer: 86 mL/min/{1.73_m2} (ref >59)
GFR calc non Af Amer: 74 mL/min/{1.73_m2} (ref >59)
GLUCOSE: 89 mg/dL (ref 65–99)
Globulin, Total: 2.9 g/dL (ref 1.5–4.5)
Potassium: 5 mmol/L (ref 3.5–5.2)
Sodium: 140 mmol/L (ref 134–144)
Total Protein: 7.2 g/dL (ref 6.0–8.5)

## 2017-03-25 LAB — HEMOGLOBIN A1C
ESTIMATED AVERAGE GLUCOSE: 128 mg/dL
Hgb A1c MFr Bld: 6.1 % — ABNORMAL HIGH (ref 4.8–5.6)

## 2017-03-27 ENCOUNTER — Ambulatory Visit (INDEPENDENT_AMBULATORY_CARE_PROVIDER_SITE_OTHER): Payer: Medicare HMO

## 2017-03-27 VITALS — BP 118/84 | HR 78 | Ht 72.0 in | Wt 270.0 lb

## 2017-03-27 DIAGNOSIS — Z23 Encounter for immunization: Secondary | ICD-10-CM | POA: Diagnosis not present

## 2017-03-27 DIAGNOSIS — Z0000 Encounter for general adult medical examination without abnormal findings: Secondary | ICD-10-CM | POA: Diagnosis not present

## 2017-03-27 DIAGNOSIS — Z1159 Encounter for screening for other viral diseases: Secondary | ICD-10-CM | POA: Diagnosis not present

## 2017-03-27 NOTE — Patient Instructions (Addendum)
Mr. Jordan Hines , Thank you for taking time to come for your Medicare Wellness Visit. I appreciate your ongoing commitment to your health goals. Please review the following plan we discussed and let me know if I can assist you in the future.   Screening recommendations/referrals: Colonoscopy: up to date, next due 02/13/2019 Recommended yearly ophthalmology/optometry visit for glaucoma screening and checkup Recommended yearly dental visit for hygiene and checkup  Vaccinations: Influenza vaccine: administered today  Pneumococcal vaccine: administered today  Tdap vaccine: up to date, next due 08/22/2018 Shingles vaccine: Check with your pharmacy about receiving the Shingrix vaccine     Advanced directives: Advance directive discussed with you today. I have provided a copy for you to complete at home and have notarized. Once this is complete please bring a copy in to our office so we can scan it into your chart.   Conditions/risks identified: Try to start back exercising on a more consistent basis.   Next appointment: 09/21/17 @ 11 am with Dr. Carlota Raspberry, next AWV in 1 year   Preventive Care 65 Years and Older, Male Preventive care refers to lifestyle choices and visits with your health care provider that can promote health and wellness. What does preventive care include?  A yearly physical exam. This is also called an annual well check.  Dental exams once or twice a year.  Routine eye exams. Ask your health care provider how often you should have your eyes checked.  Personal lifestyle choices, including:  Daily care of your teeth and gums.  Regular physical activity.  Eating a healthy diet.  Avoiding tobacco and drug use.  Limiting alcohol use.  Practicing safe sex.  Taking low doses of aspirin every day.  Taking vitamin and mineral supplements as recommended by your health care provider. What happens during an annual well check? The services and screenings done by your health care  provider during your annual well check will depend on your age, overall health, lifestyle risk factors, and family history of disease. Counseling  Your health care provider may ask you questions about your:  Alcohol use.  Tobacco use.  Drug use.  Emotional well-being.  Home and relationship well-being.  Sexual activity.  Eating habits.  History of falls.  Memory and ability to understand (cognition).  Work and work Statistician. Screening  You may have the following tests or measurements:  Height, weight, and BMI.  Blood pressure.  Lipid and cholesterol levels. These may be checked every 5 years, or more frequently if you are over 49 years old.  Skin check.  Lung cancer screening. You may have this screening every year starting at age 84 if you have a 30-pack-year history of smoking and currently smoke or have quit within the past 15 years.  Fecal occult blood test (FOBT) of the stool. You may have this test every year starting at age 2.  Flexible sigmoidoscopy or colonoscopy. You may have a sigmoidoscopy every 5 years or a colonoscopy every 10 years starting at age 24.  Prostate cancer screening. Recommendations will vary depending on your family history and other risks.  Hepatitis C blood test.  Hepatitis B blood test.  Sexually transmitted disease (STD) testing.  Diabetes screening. This is done by checking your blood sugar (glucose) after you have not eaten for a while (fasting). You may have this done every 1-3 years.  Abdominal aortic aneurysm (AAA) screening. You may need this if you are a current or former smoker.  Osteoporosis. You may be screened starting  at age 88 if you are at high risk. Talk with your health care provider about your test results, treatment options, and if necessary, the need for more tests. Vaccines  Your health care provider may recommend certain vaccines, such as:  Influenza vaccine. This is recommended every year.  Tetanus,  diphtheria, and acellular pertussis (Tdap, Td) vaccine. You may need a Td booster every 10 years.  Zoster vaccine. You may need this after age 20.  Pneumococcal 13-valent conjugate (PCV13) vaccine. One dose is recommended after age 1.  Pneumococcal polysaccharide (PPSV23) vaccine. One dose is recommended after age 68. Talk to your health care provider about which screenings and vaccines you need and how often you need them. This information is not intended to replace advice given to you by your health care provider. Make sure you discuss any questions you have with your health care provider. Document Released: 02/23/2015 Document Revised: 10/17/2015 Document Reviewed: 11/28/2014 Elsevier Interactive Patient Education  2017 [employer] Prevention in the Home Falls can cause injuries. They can happen to people of all ages. There are many things you can do to make your home safe and to help prevent falls. What can I do on the outside of my home?  Regularly fix the edges of walkways and driveways and fix any cracks.  Remove anything that might make you trip as you walk through a door, such as a raised step or threshold.  Trim any bushes or trees on the path to your home.  Use bright outdoor lighting.  Clear any walking paths of anything that might make someone trip, such as rocks or tools.  Regularly check to see if handrails are loose or broken. Make sure that both sides of any steps have handrails.  Any raised decks and porches should have guardrails on the edges.  Have any leaves, snow, or ice cleared regularly.  Use sand or salt on walking paths during winter.  Clean up any spills in your garage right away. This includes oil or grease spills. What can I do in the bathroom?  Use night lights.  Install grab bars by the toilet and in the tub and shower. Do not use towel bars as grab bars.  Use non-skid mats or decals in the tub or shower.  If you need to sit down in  the shower, use a plastic, non-slip stool.  Keep the floor dry. Clean up any water that spills on the floor as soon as it happens.  Remove soap buildup in the tub or shower regularly.  Attach bath mats securely with double-sided non-slip rug tape.  Do not have throw rugs and other things on the floor that can make you trip. What can I do in the bedroom?  Use night lights.  Make sure that you have a light by your bed that is easy to reach.  Do not use any sheets or blankets that are too big for your bed. They should not hang down onto the floor.  Have a firm chair that has side arms. You can use this for support while you get dressed.  Do not have throw rugs and other things on the floor that can make you trip. What can I do in the kitchen?  Clean up any spills right away.  Avoid walking on wet floors.  Keep items that you use a lot in easy-to-reach places.  If you need to reach something above you, use a strong step stool that has a grab bar.  Keep electrical cords out of the way.  Do not use floor polish or wax that makes floors slippery. If you must use wax, use non-skid floor wax.  Do not have throw rugs and other things on the floor that can make you trip. What can I do with my stairs?  Do not leave any items on the stairs.  Make sure that there are handrails on both sides of the stairs and use them. Fix handrails that are broken or loose. Make sure that handrails are as long as the stairways.  Check any carpeting to make sure that it is firmly attached to the stairs. Fix any carpet that is loose or worn.  Avoid having throw rugs at the top or bottom of the stairs. If you do have throw rugs, attach them to the floor with carpet tape.  Make sure that you have a light switch at the top of the stairs and the bottom of the stairs. If you do not have them, ask someone to add them for you. What else can I do to help prevent falls?  Wear shoes that:  Do not have high  heels.  Have rubber bottoms.  Are comfortable and fit you well.  Are closed at the toe. Do not wear sandals.  If you use a stepladder:  Make sure that it is fully opened. Do not climb a closed stepladder.  Make sure that both sides of the stepladder are locked into place.  Ask someone to hold it for you, if possible.  Clearly mark and make sure that you can see:  Any grab bars or handrails.  First and last steps.  Where the edge of each step is.  Use tools that help you move around (mobility aids) if they are needed. These include:  Canes.  Walkers.  Scooters.  Crutches.  Turn on the lights when you go into a dark area. Replace any light bulbs as soon as they burn out.  Set up your furniture so you have a clear path. Avoid moving your furniture around.  If any of your floors are uneven, fix them.  If there are any pets around you, be aware of where they are.  Review your medicines with your doctor. Some medicines can make you feel dizzy. This can increase your chance of falling. Ask your doctor what other things that you can do to help prevent falls. This information is not intended to replace advice given to you by your health care provider. Make sure you discuss any questions you have with your health care provider. Document Released: 11/23/2008 Document Revised: 07/05/2015 Document Reviewed: 03/03/2014 Elsevier Interactive Patient Education  2017 [employer].

## 2017-03-27 NOTE — Progress Notes (Signed)
Subjective:   Jordan Hines is a 67 y.o. male who presents for an Initial Medicare Annual Wellness Visit.  Review of Systems  N/A Cardiac Risk Factors include: advanced age (>79men, >45 women);obesity (BMI >30kg/m2);male gender;sedentary lifestyle;smoking/ tobacco exposure    Objective:    Today's Vitals   03/27/17 1120  BP: 118/84  Pulse: 78  SpO2: 97%  Weight: 270 lb (122.5 kg)  Height: 6' (1.829 m)   Body mass index is 36.62 kg/m.  Advanced Directives 03/27/2017 09/04/2014  Does Patient Have a Medical Advance Directive? No No  Would patient like information on creating a medical advance directive? Yes (MAU/Ambulatory/Procedural Areas - Information given) Yes - Educational materials given    Current Medications (verified) Outpatient Encounter Medications as of 03/27/2017  Medication Sig  . lisinopril (PRINIVIL,ZESTRIL) 10 MG tablet TAKE 1 TABLET EVERY DAY  . metoprolol succinate (TOPROL-XL) 50 MG 24 hr tablet TAKE 1 TABLET (50 MG) DAILY WITH OR IMMEDIATELY FOLLOWING A MEAL  . sertraline (ZOLOFT) 50 MG tablet Take 1 tablet (50 mg total) by mouth daily.   No facility-administered encounter medications on file as of 03/27/2017.     Allergies (verified) Patient has no known allergies.   History: Past Medical History:  Diagnosis Date  . Anxiety   . Arthritis   . Cancer ([place])   . Depression   . Hypertension    Past Surgical History:  Procedure Laterality Date  . PROSTATE SURGERY     Family History  Problem Relation Age of Onset  . Heart disease Mother   . Cancer Father   . Diabetes Father    Social History   Socioeconomic History  . Marital status: Divorced    Spouse name: None  . Number of children: 1  . Years of education: None  . Highest education level: Some college, no degree  Social Needs  . Financial resource strain: Not hard at all  . Food insecurity - worry: Never true  . Food insecurity - inability: Never true  . Transportation needs -  medical: No  . Transportation needs - non-medical: No  Occupational History  . None  Tobacco Use  . Smoking status: Current Every Day Smoker    Types: Cigars  . Smokeless tobacco: Never Used  Substance and Sexual Activity  . Alcohol use: Yes    Alcohol/week: 4.2 oz    Types: 7 Cans of beer per week  . Drug use: No  . Sexual activity: Yes  Other Topics Concern  . None  Social History Narrative  . None   Tobacco Counseling Ready to quit: Not Answered Counseling given: Not Answered   Clinical Intake:  Pre-visit preparation completed: Yes  Pain : No/denies pain     Nutritional Status: BMI > 30  Obese Nutritional Risks: Nausea/ vomitting/ diarrhea(Patient has some slight diarrhea at times) Diabetes: No  How often do you need to have someone help you when you read instructions, pamphlets, or other written materials from your doctor or pharmacy?: 1 - Never What is the last grade level you completed in school?: Some college   Interpreter Needed?: No  Information entered by :: Andrez Grime, LPN  Activities of Daily Living In your present state of health, do you have any difficulty performing the following activities: 03/27/2017  Hearing? N  Vision? N  Difficulty concentrating or making decisions? N  Walking or climbing stairs? N  Dressing or bathing? N  Doing errands, shopping? N  Preparing Food and eating ? N  Using the Toilet? N  In the past six months, have you accidently leaked urine? Y  Comment Patient has urine leakage sometimes due to history of prostate cancer  Do you have problems with loss of bowel control? N  Managing your Medications? N  Managing your Finances? N  Housekeeping or managing your Housekeeping? N  Some recent data might be hidden     Immunizations and Health Maintenance Immunization History  Administered Date(s) Administered  . Influenza,inj,Quad PF,6+ Mos 12/11/2014, 03/27/2017  . Pneumococcal Conjugate-13 09/04/2014  .  Pneumococcal Polysaccharide-23 09/17/2010, 03/27/2017  . Tdap 08/21/2008   Health Maintenance Due  Topic Date Due  . Hepatitis C Screening  [birth year]    Patient Care Team: [name], MD as PCP - General (Family Medicine) [facility]  Indicate any recent Medical Services you may have received from other than [facility] providers in the past year (date may be approximate).    Assessment:   This is a routine wellness examination for Jordan Hines.  Hearing/Vision screen Hearing Screening Comments: Patient had a hearing exam completed about 2 years ago and passed.  Vision Screening Comments: Patient goes to the [facility] for his routine eye exams when he needs his eye glasses updated.   Dietary issues and exercise activities discussed: Current Exercise Habits: The patient does not participate in regular exercise at present, Exercise limited by: None identified  Goals    . Exercise 3x per week (30 min per time)     Patient states that he wants to start back exercising on a more consistent basis.       Depression Screen PHQ 2/9 Scores 03/27/2017 03/24/2017 03/24/2017 02/14/2017  PHQ - 2 Score 0 1 0 0  PHQ- 9 Score 3 6 - -    Fall Risk Fall Risk  03/27/2017 03/24/2017 03/24/2017 02/14/2017 02/07/2017  Falls in the past year? No No No No No    Is the patient's home free of loose throw rugs in walkways, pet beds, electrical cords, etc?   yes      Grab bars in the bathroom? no      Handrails on the stairs?   yes      Adequate lighting?   yes  Timed Get Up and Go performed: yes, completed within 30 seconds   Cognitive Function:     6CIT Screen 03/27/2017  What Year? 0 points  What month? 0 points  What time? 0 points  Count back from 20 0 points  Months in reverse 0 points  Repeat phrase 0 points  Total Score 0    Screening Tests Health Maintenance  Topic Date Due  . Hepatitis C Screening  February 02, 1951  . TETANUS/TDAP  08/22/2018  . COLONOSCOPY   02/13/2019  . INFLUENZA VACCINE  Completed  . PNA vac Low Risk Adult  Completed    Qualifies for Shingles Vaccine? Advised patient to check with pharmacy about receiving the Shingrix vaccine   Cancer Screenings: Lung: Low Dose CT Chest recommended if Age 22-80 years, 30 pack-year currently smoking OR have quit w/in 15years. Patient does qualify. Patient declined lung cancer screening at this time.  Colorectal: completed per patient 02/12/2009, Advised patient to contact GI office and have them send [place] a copy of the report  Additional Screenings:  Hepatitis B/HIV/Syphillis: not indicated  Hepatitis C Screening: Hep C antibody ordered today      Plan:   I have personally reviewed and noted the following in the patient's chart:   .  Medical and social history . Use of alcohol, tobacco or illicit drugs  . Current medications and supplements . Functional ability and status . Nutritional status . Physical activity . Advanced directives . List of other physicians . Hospitalizations, surgeries, and ER visits in previous 12 months . Vitals . Screenings to include cognitive, depression, and falls . Referrals and appointments  In addition, I have reviewed and discussed with patient certain preventive protocols, quality metrics, and best practice recommendations. A written personalized care plan for preventive services as well as general preventive health recommendations were provided to patient.   1. Need for hepatitis C screening test - Hepatitis C Antibody  2. Encounter for Medicare annual wellness exam  3. Need for immunization against influenza - Flu Vaccine QUAD 6+ mos IM (Fluarix)  4. Need for 23-polyvalent pneumococcal polysaccharide vaccine - Pneumococcal 23   Andrez Grime, [place]   1/32/4401

## 2017-03-28 LAB — HEPATITIS C ANTIBODY

## 2017-03-31 ENCOUNTER — Telehealth: Payer: Self-pay

## 2017-03-31 NOTE — Telephone Encounter (Signed)
Phone call to [facility] mail order pharmacy, spoke with pharmacy tech [place]. She states Zoloft 50mg  is out for delivery today.   Copied from [place]. Topic: General - Other >> Mar 30, 2017 12:55 PM Yvette Rack wrote: Reason for CRM: [place], [place] [phone] (Phone) [phone] (Fax) Stating that they still haven't received the Rx for sertraline (ZOLOFT) 50 MG tablet

## 2017-03-31 NOTE — Telephone Encounter (Signed)
Phone call to patient. Per signed release, message left for patient stating Zoloft 50mg  is out for delivery today.

## 2017-04-01 DIAGNOSIS — C61 Malignant neoplasm of prostate: Secondary | ICD-10-CM | POA: Diagnosis not present

## 2017-04-06 DIAGNOSIS — N5231 Erectile dysfunction following radical prostatectomy: Secondary | ICD-10-CM | POA: Diagnosis not present

## 2017-04-06 DIAGNOSIS — C61 Malignant neoplasm of prostate: Secondary | ICD-10-CM | POA: Diagnosis not present

## 2017-05-20 ENCOUNTER — Encounter: Payer: Self-pay | Admitting: Physician Assistant

## 2017-06-25 ENCOUNTER — Ambulatory Visit (INDEPENDENT_AMBULATORY_CARE_PROVIDER_SITE_OTHER): Payer: Medicare HMO | Admitting: Family Medicine

## 2017-06-25 ENCOUNTER — Encounter: Payer: Self-pay | Admitting: Family Medicine

## 2017-06-25 VITALS — BP 110/72 | HR 80 | Temp 97.9°F | Ht 71.5 in | Wt 271.4 lb

## 2017-06-25 DIAGNOSIS — J309 Allergic rhinitis, unspecified: Secondary | ICD-10-CM

## 2017-06-25 MED ORDER — FLUTICASONE PROPIONATE 50 MCG/ACT NA SUSP: 1.0000 | Freq: Every day | NASAL | 6 refills | Status: DC

## 2017-06-25 NOTE — Patient Instructions (Addendum)
Symptoms currently appear to be due to allergies.  Try Claritin over-the-counter once per day, I sent a prescription for Flonase nasal spray to your pharmacy.  Try to stop using the decongestant nasal spray at this time.  If you do need that medication, do not use it daily. See other information below and allergies, follow-up if the symptoms are not improving in the next 1 to 2 weeks, sooner if worse.  Return to the clinic or go to the nearest emergency room if any of your symptoms worsen or new symptoms occur.  Allergic Rhinitis, Adult Allergic rhinitis is an allergic reaction that affects the mucous membrane inside the nose. It causes sneezing, a runny or stuffy nose, and the feeling of mucus going down the back of the throat (postnasal drip). Allergic rhinitis can be mild to severe. There are two types of allergic rhinitis:  Seasonal. This type is also called hay fever. It happens only during certain seasons.  Perennial. This type can happen at any time of the year.  What are the causes? This condition happens when the body's defense system (immune system) responds to certain harmless substances called allergens as though they were germs.  Seasonal allergic rhinitis is triggered by pollen, which can come from grasses, trees, and weeds. Perennial allergic rhinitis may be caused by:  House dust mites.  Pet dander.  Mold spores.  What are the signs or symptoms? Symptoms of this condition include:  Sneezing.  Runny or stuffy nose (nasal congestion).  Postnasal drip.  Itchy nose.  Tearing of the eyes.  Trouble sleeping.  Daytime sleepiness.  How is this diagnosed? This condition may be diagnosed based on:  Your medical history.  A physical exam.  Tests to check for related conditions, such as: ? Asthma. ? Pink eye. ? Ear infection. ? Upper respiratory infection.  Tests to find out which allergens trigger your symptoms. These may include skin or blood tests.  How  is this treated? There is no cure for this condition, but treatment can help control symptoms. Treatment may include:  Taking medicines that block allergy symptoms, such as antihistamines. Medicine may be given as a shot, nasal spray, or pill.  Avoiding the allergen.  Desensitization. This treatment involves getting ongoing shots until your body becomes less sensitive to the allergen. This treatment may be done if other treatments do not help.  If taking medicine and avoiding the allergen does not work, new, stronger medicines may be prescribed.  Follow these instructions at home:  Find out what you are allergic to. Common allergens include smoke, dust, and pollen.  Avoid the things you are allergic to. These are some things you can do to help avoid allergens: ? Replace carpet with wood, tile, or vinyl flooring. Carpet can trap dander and dust. ? Do not smoke. Do not allow smoking in your home. ? Change your heating and air conditioning filter at least once a month. ? During allergy season:  Keep windows closed as much as possible.  Plan outdoor activities when pollen counts are lowest. This is usually during the evening hours.  When coming indoors, change clothing and shower before sitting on furniture or bedding.  Take over-the-counter and prescription medicines only as told by your health care provider.  Keep all follow-up visits as told by your health care provider. This is important. Contact a health care provider if:  You have a fever.  You develop a persistent cough.  You make whistling sounds when you breathe (you  wheeze).  Your symptoms interfere with your normal daily activities. Get help right away if:  You have shortness of breath. Summary  This condition can be managed by taking medicines as directed and avoiding allergens.  Contact your health care provider if you develop a persistent cough or fever.  During allergy season, keep windows closed as much as  possible. This information is not intended to replace advice given to you by your health care provider. Make sure you discuss any questions you have with your health care provider. Document Released: 10/22/2000 Document Revised: 03/06/2016 Document Reviewed: 03/06/2016 Elsevier Interactive Patient Education  2018 [employer].   IF you received an x-ray today, you will receive an invoice from [facility]. Please contact [facility] at [phone] with questions or concerns regarding your invoice.   IF you received labwork today, you will receive an invoice from [employer]. Please contact [facility] at [phone] with questions or concerns regarding your invoice.   Our billing staff will not be able to assist you with questions regarding bills from these companies.  You will be contacted with the lab results as soon as they are available. The fastest way to get your results is to activate your My Chart account. Instructions are located on the last page of this paperwork. If you have not heard from [place] regarding the results in 2 weeks, please contact this office.

## 2017-06-25 NOTE — Progress Notes (Signed)
Subjective:  By signing my name below, I, Essence Howell, attest that this documentation has been prepared under the direction and in the presence of [name], MD Electronically Signed: Ladene Artist, ED Scribe 06/25/2017 at 12:16 PM.   Patient ID: Jordan Hines, male    DOB: 1950-04-25, 67 y.o.   MRN: [id number]  Chief Complaint  Patient presents with  . Sinus Problem    x3 weeks, runny nose & sneezing    HPI Jordan Hines is a 67 y.o. male who presents to Primary Care at [facility] nasal congestion and clear rhinorrhea x 1 month. Pt also reports occasional sneezing and puffiness to his eyes upon waking in the morning. He has tried Afrin nasal spray every other day x a few wks with temporary relief. Denies fever, eye itching or dishcarge, cough, sob.  There are no active problems to display for this patient.  Past Medical History:  Diagnosis Date  . Anxiety   . Arthritis   . Cancer ([place])   . Depression   . Hypertension    Past Surgical History:  Procedure Laterality Date  . PROSTATE SURGERY     No Known Allergies Prior to Admission medications   Medication Sig Start Date End Date Taking? Authorizing Provider  lisinopril (PRINIVIL,ZESTRIL) 10 MG tablet TAKE 1 TABLET EVERY DAY 03/24/17   [name], MD  metoprolol succinate (TOPROL-XL) 50 MG 24 hr tablet TAKE 1 TABLET (50 MG) DAILY WITH OR IMMEDIATELY FOLLOWING A MEAL 03/24/17   [name], MD  sertraline (ZOLOFT) 50 MG tablet Take 1 tablet (50 mg total) by mouth daily. 03/24/17   [name], MD   Social History   Socioeconomic History  . Marital status: Divorced    Spouse name: Not on file  . Number of children: 1  . Years of education: Not on file  . Highest education level: Some college, no degree  Occupational History  . Not on file  Social Needs  . Financial resource strain: Not hard at all  . Food insecurity:    Worry: Never true    Inability: Never true  . Transportation needs:    Medical: No    Non-medical: No  Tobacco Use  . Smoking status: Current Every Day Smoker    Types: Cigars  . Smokeless tobacco: Never Used  Substance and Sexual Activity  . Alcohol use: Yes    Alcohol/week: 4.2 oz    Types: 7 Cans of beer per week  . Drug use: No  . Sexual activity: Yes  Lifestyle  . Physical activity:    Days per week: 0 days    Minutes per session: 0 min  . Stress: Not at all  Relationships  . Social connections:    Talks on phone: More than three times a week    Gets together: Once a week    Attends religious service: Never    Active member of club or organization: No    Attends meetings of clubs or organizations: Never    Relationship status: Divorced  . Intimate partner violence:    Fear of current or ex partner: No    Emotionally abused: No    Physically abused: No    Forced sexual activity: No  Other Topics Concern  . Not on file  Social History Narrative  . Not on file   Review of Systems  Constitutional: Negative for fever.  HENT: Positive for congestion, rhinorrhea and sneezing (occasional).   Eyes:  Negative for discharge and itching.  Respiratory: Negative for cough and shortness of breath.       Objective:   Physical Exam  Constitutional: He is oriented to person, place, and time. He appears well-developed and well-nourished.  HENT:  Head: Normocephalic and atraumatic.  Right Ear: Tympanic membrane, external ear and ear canal normal.  Left Ear: Tympanic membrane, external ear and ear canal normal.  Nose: Mucosal edema present. No nasal septal hematoma.  Mouth/Throat: Oropharynx is clear and moist and mucous membranes are normal. No oropharyngeal exudate or posterior oropharyngeal erythema.  Nose: edema, L greater than R, turbinates. Septum appears intact without abrasions or bleeding  Eyes: Pupils are equal, round, and reactive to light. Conjunctivae are normal.  Neck: Neck supple.  Cardiovascular: Normal rate, regular rhythm, normal  heart sounds and intact distal pulses.  No murmur heard. Pulmonary/Chest: Effort normal and breath sounds normal. He has no wheezes. He has no rhonchi. He has no rales.  Abdominal: Soft. There is no tenderness.  Lymphadenopathy:    He has no cervical adenopathy.  Neurological: He is alert and oriented to person, place, and time.  Skin: Skin is warm and dry. No rash noted.  Psychiatric: He has a normal mood and affect. His behavior is normal.  Vitals reviewed.  Vitals:   06/25/17 1159  BP: 110/72  Pulse: 80  Temp: 97.9 F (36.6 C)  TempSrc: Oral  SpO2: 98%  Weight: 271 lb 6.4 oz (123.1 kg)  Height: 5' 11.5" (1.816 m)      Assessment & Plan:   Jordan Hines is a 67 y.o. male Allergic rhinitis, unspecified seasonality, unspecified trigger - Plan: fluticasone (FLONASE) 50 MCG/ACT nasal spray  -Currently appears to be allergic rhinitis.  Has been using nasal decongestant, but doubt rhinitis medicamentosa at this time.  Advised to cut back on nasal decongestant, start Flonase nasal spray - correct technique discussed, otc Claritin, and handout given on allergies.  RTC precautions given  Meds ordered this encounter  Medications  . fluticasone (FLONASE) 50 MCG/ACT nasal spray    Sig: Place 1-2 sprays into both nostrils daily.    Dispense:  16 g    Refill:  6   Patient Instructions    Symptoms currently appear to be due to allergies.  Try Claritin over-the-counter once per day, I sent a prescription for Flonase nasal spray to your pharmacy.  Try to stop using the decongestant nasal spray at this time.  If you do need that medication, do not use it daily. See other information below and allergies, follow-up if the symptoms are not improving in the next 1 to 2 weeks, sooner if worse.  Return to the clinic or go to the nearest emergency room if any of your symptoms worsen or new symptoms occur.  Allergic Rhinitis, Adult Allergic rhinitis is an allergic reaction that affects the  mucous membrane inside the nose. It causes sneezing, a runny or stuffy nose, and the feeling of mucus going down the back of the throat (postnasal drip). Allergic rhinitis can be mild to severe. There are two types of allergic rhinitis:  Seasonal. This type is also called hay fever. It happens only during certain seasons.  Perennial. This type can happen at any time of the year.  What are the causes? This condition happens when the body's defense system (immune system) responds to certain harmless substances called allergens as though they were germs.  Seasonal allergic rhinitis is triggered by pollen, which can come from  grasses, trees, and weeds. Perennial allergic rhinitis may be caused by:  House dust mites.  Pet dander.  Mold spores.  What are the signs or symptoms? Symptoms of this condition include:  Sneezing.  Runny or stuffy nose (nasal congestion).  Postnasal drip.  Itchy nose.  Tearing of the eyes.  Trouble sleeping.  Daytime sleepiness.  How is this diagnosed? This condition may be diagnosed based on:  Your medical history.  A physical exam.  Tests to check for related conditions, such as: ? Asthma. ? Pink eye. ? Ear infection. ? Upper respiratory infection.  Tests to find out which allergens trigger your symptoms. These may include skin or blood tests.  How is this treated? There is no cure for this condition, but treatment can help control symptoms. Treatment may include:  Taking medicines that block allergy symptoms, such as antihistamines. Medicine may be given as a shot, nasal spray, or pill.  Avoiding the allergen.  Desensitization. This treatment involves getting ongoing shots until your body becomes less sensitive to the allergen. This treatment may be done if other treatments do not help.  If taking medicine and avoiding the allergen does not work, new, stronger medicines may be prescribed.  Follow these instructions at home:  Find  out what you are allergic to. Common allergens include smoke, dust, and pollen.  Avoid the things you are allergic to. These are some things you can do to help avoid allergens: ? Replace carpet with wood, tile, or vinyl flooring. Carpet can trap dander and dust. ? Do not smoke. Do not allow smoking in your home. ? Change your heating and air conditioning filter at least once a month. ? During allergy season:  Keep windows closed as much as possible.  Plan outdoor activities when pollen counts are lowest. This is usually during the evening hours.  When coming indoors, change clothing and shower before sitting on furniture or bedding.  Take over-the-counter and prescription medicines only as told by your health care provider.  Keep all follow-up visits as told by your health care provider. This is important. Contact a health care provider if:  You have a fever.  You develop a persistent cough.  You make whistling sounds when you breathe (you wheeze).  Your symptoms interfere with your normal daily activities. Get help right away if:  You have shortness of breath. Summary  This condition can be managed by taking medicines as directed and avoiding allergens.  Contact your health care provider if you develop a persistent cough or fever.  During allergy season, keep windows closed as much as possible. This information is not intended to replace advice given to you by your health care provider. Make sure you discuss any questions you have with your health care provider. Document Released: 10/22/2000 Document Revised: 03/06/2016 Document Reviewed: 03/06/2016 Elsevier Interactive Patient Education  2018 [employer].   IF you received an x-ray today, you will receive an invoice from [facility]. Please contact [facility] Radiology at [phone] with questions or concerns regarding your invoice.   IF you received labwork today, you will receive an invoice from [place].  Please contact [facility] at [phone] with questions or concerns regarding your invoice.   Our billing staff will not be able to assist you with questions regarding bills from these companies.  You will be contacted with the lab results as soon as they are available. The fastest way to get your results is to activate your My Chart account. Instructions are located on  the last page of this paperwork. If you have not heard from [place] regarding the results in 2 weeks, please contact this office.       I personally performed the services described in this documentation, which was scribed in my presence. The recorded information has been reviewed and considered for accuracy and completeness, addended by me as needed, and agree with information above.  Signed,   Merri Ray, MD Primary Care at [place].  06/25/17 12:30 PM

## 2017-07-13 ENCOUNTER — Ambulatory Visit (INDEPENDENT_AMBULATORY_CARE_PROVIDER_SITE_OTHER): Payer: Medicare HMO | Admitting: Family Medicine

## 2017-07-13 ENCOUNTER — Encounter: Payer: Self-pay | Admitting: Family Medicine

## 2017-07-13 ENCOUNTER — Other Ambulatory Visit: Payer: Self-pay

## 2017-07-13 VITALS — BP 104/74 | HR 69 | Temp 97.7°F | Ht 71.5 in | Wt 274.4 lb

## 2017-07-13 DIAGNOSIS — R0981 Nasal congestion: Secondary | ICD-10-CM

## 2017-07-13 DIAGNOSIS — J309 Allergic rhinitis, unspecified: Secondary | ICD-10-CM

## 2017-07-13 DIAGNOSIS — R7303 Prediabetes: Secondary | ICD-10-CM

## 2017-07-13 LAB — GLUCOSE, POCT (MANUAL RESULT ENTRY): POC Glucose: 92 mg/dl (ref 70–99)

## 2017-07-13 MED ORDER — IPRATROPIUM BROMIDE 0.06 % NA SOLN: 2.0000 | Freq: Four times a day (QID) | NASAL | 1 refills | Status: DC | PRN

## 2017-07-13 MED ORDER — PREDNISONE 20 MG PO TABS: 40.0000 mg | ORAL_TABLET | Freq: Every day | ORAL | 0 refills | Status: DC

## 2017-07-13 NOTE — Progress Notes (Addendum)
Subjective:  By signing my name below, I, Moises Blood, attest that this documentation has been prepared under the direction and in the presence of Merri Ray, MD. Electronically Signed: Moises Blood, [place]. 07/13/2017 , 3:34 PM .  Patient was seen in Room 11 .   Patient ID: Jordan Hines, male    DOB: 03-Oct-1950, 67 y.o.   MRN: [id number] Chief Complaint  Patient presents with  . Sinus Problem    follow up from last visit 06/25/2017   HPI Jordan Hines is a 67 y.o. male Here for follow up, last seen 2 weeks ago with 1 month history of nasal congestion. He was using afrin every other day. Suspected allergic rhinitis, less likely rhinitis medicamentosa. Recommended treat with Flonase and Claritin; recommended stopping nasal decongestant.   He states his congestion is still present without any improvement. He's been using Flonase 2 sprays in each nostril 2-3 times daily, as well as Claritin qd. He hasn't used his Afrin after Flonase nasal spray prescribed. He's getting clear congestion out. He denies sinus pain, facial pain or fever. He's sneezing about 3-4 times continuously each time. He's noticed feeling a little fatigue with his symptoms.   There are no active problems to display for this patient.  Past Medical History:  Diagnosis Date  . Anxiety   . Arthritis   . Cancer ([place])   . Depression   . Hypertension    Past Surgical History:  Procedure Laterality Date  . PROSTATE SURGERY     No Known Allergies Prior to Admission medications   Medication Sig Start Date End Date Taking? Authorizing Provider  fluticasone (FLONASE) 50 MCG/ACT nasal spray Place 1-2 sprays into both nostrils daily. 06/25/17  Yes [name], MD  lisinopril (PRINIVIL,ZESTRIL) 10 MG tablet TAKE 1 TABLET EVERY DAY 03/24/17  Yes [name], MD  metoprolol succinate (TOPROL-XL) 50 MG 24 hr tablet TAKE 1 TABLET (50 MG) DAILY WITH OR IMMEDIATELY FOLLOWING A MEAL 03/24/17  Yes [name], MD  sertraline (ZOLOFT) 50 MG tablet Take 1 tablet (50 mg total) by mouth daily. 03/24/17  Yes [name], MD   Social History   Socioeconomic History  . Marital status: Divorced    Spouse name: Not on file  . Number of children: 1  . Years of education: Not on file  . Highest education level: Some college, no degree  Occupational History  . Not on file  Social Needs  . Financial resource strain: Not hard at all  . Food insecurity:    Worry: Never true    Inability: Never true  . Transportation needs:    Medical: No    Non-medical: No  Tobacco Use  . Smoking status: Current Every Day Smoker    Types: Cigars  . Smokeless tobacco: Never Used  Substance and Sexual Activity  . Alcohol use: Yes    Alcohol/week: 4.2 oz    Types: 7 Cans of beer per week  . Drug use: No  . Sexual activity: Yes  Lifestyle  . Physical activity:    Days per week: 0 days    Minutes per session: 0 min  . Stress: Not at all  Relationships  . Social connections:    Talks on phone: More than three times a week    Gets together: Once a week    Attends religious service: Never    Active member of club or organization: No    Attends meetings of clubs  or organizations: Never    Relationship status: Divorced  . Intimate partner violence:    Fear of current or ex partner: No    Emotionally abused: No    Physically abused: No    Forced sexual activity: No  Other Topics Concern  . Not on file  Social History Narrative  . Not on file   Review of Systems  Constitutional: Positive for fatigue. Negative for unexpected weight change.  HENT: Positive for congestion and sneezing. Negative for rhinorrhea, sinus pressure and sinus pain.   Eyes: Negative for visual disturbance.  Respiratory: Negative for cough, chest tightness and shortness of breath.   Cardiovascular: Negative for chest pain, palpitations and leg swelling.  Gastrointestinal: Negative for abdominal pain and blood in stool.    Neurological: Negative for dizziness, light-headedness and headaches.       Objective:   Physical Exam  Constitutional: He is oriented to person, place, and time. He appears well-developed and well-nourished.  HENT:  Head: Normocephalic and atraumatic.  Right Ear: Tympanic membrane, external ear and ear canal normal.  Left Ear: Tympanic membrane, external ear and ear canal normal.  Nose: Mucosal edema present. No rhinorrhea (no active discharge). Right sinus exhibits no maxillary sinus tenderness and no frontal sinus tenderness. Left sinus exhibits no maxillary sinus tenderness and no frontal sinus tenderness.  Mouth/Throat: Oropharynx is clear and moist and mucous membranes are normal. No oropharyngeal exudate or posterior oropharyngeal erythema.  Some edema of the turbinates, no active discharge; sinuses non tender, septum intact without bleeding  Eyes: Pupils are equal, round, and reactive to light. Conjunctivae are normal.  Neck: Neck supple.  Cardiovascular: Normal rate, regular rhythm, normal heart sounds and intact distal pulses.  No murmur heard. Pulmonary/Chest: Effort normal and breath sounds normal. He has no wheezes. He has no rhonchi. He has no rales.  Abdominal: Soft. There is no tenderness.  Lymphadenopathy:    He has no cervical adenopathy.  Neurological: He is alert and oriented to person, place, and time.  Skin: Skin is warm and dry. No rash noted.  Psychiatric: He has a normal mood and affect. His behavior is normal.  Vitals reviewed.   Vitals:   07/13/17 1441 07/13/17 1450  BP: 104/74   Pulse: 69   Temp: (!) 97.2 F (36.2 C) 97.7 F (36.5 C)  TempSrc: Oral Oral  SpO2: 97%   Weight: 274 lb 6.4 oz (124.5 kg)   Height: 5' 11.5" (1.816 m)    Results for orders placed or performed in visit on 07/13/17  POCT glucose (manual entry)  Result Value Ref Range   POC Glucose 92 70 - 99 mg/dl        Assessment & Plan:   Jordan Hines is a 67 y.o.  male Allergic rhinitis, unspecified seasonality, unspecified trigger  Prediabetes - Plan: POCT glucose (manual entry)  Nasal congestion - Plan: ipratropium (ATROVENT) 0.06 % nasal spray, predniSONE (DELTASONE) 20 MG tablet   Still suspect allergic rhinitis but may have had some component of rhinitis medicamentosa due to continuous congestion off of the nasal decongestant.  Minimal change with Claritin and fluticasone as prescribed.  -Prednisone 40 mg daily x5 days, change to Atrovent nasal spray 4 times daily as needed.  Or Flonase if that is more effective.  Potential side effects discussed with prednisone and RTC precautions given.  Point-of-care glucose obtained given history of prediabetes.   Meds ordered this encounter  Medications  . ipratropium (ATROVENT) 0.06 % nasal spray  Sig: Place 2 sprays into both nostrils 4 (four) times daily as needed for rhinitis.    Dispense:  15 mL    Refill:  1  . predniSONE (DELTASONE) 20 MG tablet    Sig: Take 2 tablets (40 mg total) by mouth daily with breakfast.    Dispense:  10 tablet    Refill:  0   Patient Instructions    Symptoms still appear to be a congestion or possible allergies, does not sound infected at this time.  Try prednisone 2 pills/day for the next 5 days, try the new nasal spray up to 4 times per day, or can use the fluticasone letter.  Follow-up if not improving in the next 2 weeks, sooner if any worsening.     IF you received an x-ray today, you will receive an invoice from [facility]. Please contact [facility] at [phone] with questions or concerns regarding your invoice.   IF you received labwork today, you will receive an invoice from [place]. Please contact [facility] at [phone] with questions or concerns regarding your invoice.   Our billing staff will not be able to assist you with questions regarding bills from these companies.  You will be contacted with the lab results as soon as  they are available. The fastest way to get your results is to activate your My Chart account. Instructions are located on the last page of this paperwork. If you have not heard from [place] regarding the results in 2 weeks, please contact this office.       I personally performed the services described in this documentation, which was scribed in my presence. The recorded information has been reviewed and considered for accuracy and completeness, addended by me as needed, and agree with information above.  Signed,   Merri Ray, MD Primary Care at [place].  07/13/17 3:41 PM

## 2017-07-13 NOTE — Patient Instructions (Addendum)
  Symptoms still appear to be a congestion or possible allergies, does not sound infected at this time.  Try prednisone 2 pills/day for the next 5 days, try the new nasal spray up to 4 times per day, or can use the fluticasone letter.  Follow-up if not improving in the next 2 weeks, sooner if any worsening.     IF you received an x-ray today, you will receive an invoice from [facility]. Please contact [facility] Radiology at [phone] with questions or concerns regarding your invoice.   IF you received labwork today, you will receive an invoice from [place]. Please contact [facility] at [phone] with questions or concerns regarding your invoice.   Our billing staff will not be able to assist you with questions regarding bills from these companies.  You will be contacted with the lab results as soon as they are available. The fastest way to get your results is to activate your My Chart account. Instructions are located on the last page of this paperwork. If you have not heard from [place] regarding the results in 2 weeks, please contact this office.

## 2017-07-29 ENCOUNTER — Other Ambulatory Visit: Payer: Self-pay | Admitting: Family Medicine

## 2017-07-29 DIAGNOSIS — R0981 Nasal congestion: Secondary | ICD-10-CM

## 2017-07-30 NOTE — Telephone Encounter (Signed)
Spoke  With [employer] at [facility]  Patient has a refill on this rx request  For the  atrovent  She will fill it

## 2017-08-10 ENCOUNTER — Ambulatory Visit (INDEPENDENT_AMBULATORY_CARE_PROVIDER_SITE_OTHER): Payer: Medicare HMO | Admitting: Family Medicine

## 2017-08-10 ENCOUNTER — Ambulatory Visit (INDEPENDENT_AMBULATORY_CARE_PROVIDER_SITE_OTHER): Payer: Medicare HMO

## 2017-08-10 ENCOUNTER — Other Ambulatory Visit: Payer: Self-pay

## 2017-08-10 ENCOUNTER — Encounter: Payer: Self-pay | Admitting: Family Medicine

## 2017-08-10 VITALS — BP 131/82 | HR 82 | Temp 97.7°F | Ht 71.5 in | Wt 281.0 lb

## 2017-08-10 DIAGNOSIS — R0600 Dyspnea, unspecified: Secondary | ICD-10-CM | POA: Diagnosis not present

## 2017-08-10 DIAGNOSIS — R0609 Other forms of dyspnea: Secondary | ICD-10-CM

## 2017-08-10 DIAGNOSIS — R5383 Other fatigue: Secondary | ICD-10-CM

## 2017-08-10 DIAGNOSIS — L918 Other hypertrophic disorders of the skin: Secondary | ICD-10-CM | POA: Diagnosis not present

## 2017-08-10 DIAGNOSIS — I10 Essential (primary) hypertension: Secondary | ICD-10-CM

## 2017-08-10 DIAGNOSIS — R0981 Nasal congestion: Secondary | ICD-10-CM | POA: Diagnosis not present

## 2017-08-10 NOTE — Patient Instructions (Addendum)
For your nasal congestion, okay to continue the Atrovent nasal spray and Claritin.  If that is allergic related, those symptoms should be improving in the next 1 to 2 weeks.  If not, please follow-up and we can look at other causes.  Skin tags do not appear concerning at this time, but we certainly can remove those.  Please schedule a separate appointment for that procedure.   For weight gain, shortness of breath with exertion, and fatigue, xray and EKG look ok. I will check some blood work as well as refer you to a cardiologist.  If you have any chest pains or acute worsening shortness of breath, please proceed to the emergency room.  Cutting back on fast food and sugar-containing beverages such as sodas can be helpful for weight loss, but we will look into other causes of that weight gain as discussed.  Thank you for coming in today. Please follow up in next 2 weeks to discuss this further.   Return to the clinic or go to the nearest emergency room if any of your symptoms worsen or new symptoms occur.    Fatigue Fatigue is feeling tired all of the time, a lack of energy, or a lack of motivation. Occasional or mild fatigue is often a normal response to activity or life in general. However, long-lasting (chronic) or extreme fatigue may indicate an underlying medical condition. Follow these instructions at home: Watch your fatigue for any changes. The following actions may help to lessen any discomfort you are feeling:  Talk to your health care provider about how much sleep you need each night. Try to get the required amount every night.  Take medicines only as directed by your health care provider.  Eat a healthy and nutritious diet. Ask your health care provider if you need help changing your diet.  Drink enough fluid to keep your urine clear or pale yellow.  Practice ways of relaxing, such as yoga, meditation, massage therapy, or acupuncture.  Exercise regularly.  Change situations that  cause you stress. Try to keep your work and personal routine reasonable.  Do not abuse illegal drugs.  Limit alcohol intake to no more than 1 drink per day for nonpregnant women and 2 drinks per day for men. One drink equals 12 ounces of beer, 5 ounces of wine, or 1 ounces of hard liquor.  Take a multivitamin, if directed by your health care provider.  Contact a health care provider if:  Your fatigue does not get better.  You have a fever.  You have unintentional weight loss or gain.  You have headaches.  You have difficulty: ? Falling asleep. ? Sleeping throughout the night.  You feel angry, guilty, anxious, or sad.  You are unable to have a bowel movement (constipation).  You skin is dry.  Your legs or another part of your body is swollen. Get help right away if:  You feel confused.  Your vision is blurry.  You feel faint or pass out.  You have a severe headache.  You have severe abdominal, pelvic, or back pain.  You have chest pain, shortness of breath, or an irregular or fast heartbeat.  You are unable to urinate or you urinate less than normal.  You develop abnormal bleeding, such as bleeding from the rectum, vagina, nose, lungs, or nipples.  You vomit blood.  You have thoughts about harming yourself or committing suicide.  You are worried that you might harm someone else. This information is not intended to  replace advice given to you by your health care provider. Make sure you discuss any questions you have with your health care provider. Document Released: 11/24/2006 Document Revised: 07/05/2015 Document Reviewed: 05/31/2013 Elsevier Interactive Patient Education  2018 [place] of Breath, Adult Shortness of breath means you have trouble breathing. Your lungs are organs for breathing. Follow these instructions at home: Pay attention to any changes in your symptoms. Take these actions to help with your condition:  Do not smoke.  Smoking can cause shortness of breath. If you need help to quit smoking, ask your doctor.  Avoid things that can make it harder to breathe, such as: ? Mold. ? Dust. ? Air pollution. ? Chemical smells. ? Things that can cause allergy symptoms (allergens), if you have allergies.  Keep your living space clean and free of mold and dust.  Rest as needed. Slowly return to your usual activities.  Take over-the-counter and prescription medicines, including oxygen and inhaled medicines, only as told by your doctor.  Keep all follow-up visits as told by your doctor. This is important.  Contact a doctor if:  Your condition does not get better as soon as expected.  You have a hard time doing your normal activities, even after you rest.  You have new symptoms. Get help right away if:  You have trouble breathing when you are resting.  You feel light-headed or you faint.  You have a cough that is not helped by medicines.  You cough up blood.  You have pain with breathing.  You have pain in your chest, arms, shoulders, or belly (abdomen).  You have a fever.  You cannot walk up stairs.  You cannot exercise the way you normally do. This information is not intended to replace advice given to you by your health care provider. Make sure you discuss any questions you have with your health care provider. Document Released: 07/16/2007 Document Revised: 02/14/2016 Document Reviewed: 02/14/2016 Elsevier Interactive Patient Education  2017 [employer].   IF you received an x-ray today, you will receive an invoice from [facility]. Please contact [facility] at [phone] with questions or concerns regarding your invoice.   IF you received labwork today, you will receive an invoice from [facility]. Please contact [facility] at [phone] with questions or concerns regarding your invoice.   Our billing staff will not be able to assist you with questions regarding bills from  these companies.  You will be contacted with the lab results as soon as they are available. The fastest way to get your results is to activate your My Chart account. Instructions are located on the last page of this paperwork. If you have not heard from [place] regarding the results in 2 weeks, please contact this office.

## 2017-08-10 NOTE — Progress Notes (Signed)
Subjective:    Patient ID: Jordan Hines, male    DOB: 10/02/1950, 67 y.o.   MRN: [id number]  HPI Jordan Hines is a 67 y.o. male Presents today for: Chief Complaint  Patient presents with  . Allergic Rhinitis     runny nose and nasal conjestion.   . skin tags    should it be removed and 2 in private areas.    Nasal congestion: See prior notes regarding allergies. Last seen June 3rd. Allergic rhinitis or rhinitis medicamentosa. Treated with prednisone 40mg  qd for 5 days, and atrovent NS as needed up to QID. Last used atrovent ns this am.  Does help some, uses twice per day.  Congestion comes and goes - not every day. Some sneezing and runny nose as well.  Started claritin 3 days ago with some benefit. atrovent ns seems to work better than flonase.  No new living situation, no pets. Carpet and hardwood in house.   Skin tags around scrotum area and inner thigh - 2 years. New one on thigh. No hx of genital warts.has skin tags removed from other areas. Not bothering him, just wanted to look at getting them removed.   Obesity: Concerned about weight gain.  Body mass index is 38.65 kg/m. Wt Readings from Last 3 Encounters:  08/10/17 281 lb (127.5 kg)  07/13/17 274 lb 6.4 oz (124.5 kg)  06/25/17 271 lb 6.4 oz (123.1 kg)  walking 2-3 days per week. Fast food 3 times per week, 2 sodas per day. No sweet tea.  Does have some dyspnea with activity. Hx of HTN. Notices dyspnea with steps, or bringing in groceries. Past few weeks more short of breath. No chest pains. Feels tired after eating meal. Has not noticed leg swelling out of the ordinary.   There are no active problems to display for this patient.  Past Medical History:  Diagnosis Date  . Anxiety   . Arthritis   . Cancer ([place])   . Depression   . Hypertension    Past Surgical History:  Procedure Laterality Date  . PROSTATE SURGERY     No Known Allergies Prior to Admission medications   Medication Sig Start Date End Date  Taking? Authorizing Provider  fluticasone (FLONASE) 50 MCG/ACT nasal spray Place 1-2 sprays into both nostrils daily. 06/25/17  Yes [name], MD  ipratropium (ATROVENT) 0.06 % nasal spray Place 2 sprays into both nostrils 4 (four) times daily as needed for rhinitis. 07/13/17  Yes [name], MD  lisinopril (PRINIVIL,ZESTRIL) 10 MG tablet TAKE 1 TABLET EVERY DAY 03/24/17  Yes [name], MD  metoprolol succinate (TOPROL-XL) 50 MG 24 hr tablet TAKE 1 TABLET (50 MG) DAILY WITH OR IMMEDIATELY FOLLOWING A MEAL 03/24/17  Yes [name], MD  predniSONE (DELTASONE) 20 MG tablet Take 2 tablets (40 mg total) by mouth daily with breakfast. 07/13/17  Yes [name], MD  sertraline (ZOLOFT) 50 MG tablet Take 1 tablet (50 mg total) by mouth daily. 03/24/17  Yes [name], MD   Social History   Socioeconomic History  . Marital status: Divorced    Spouse name: Not on file  . Number of children: 1  . Years of education: Not on file  . Highest education level: Some college, no degree  Occupational History  . Not on file  Social Needs  . Financial resource strain: Not hard at all  . Food insecurity:    Worry: Never true  Inability: Never true  . Transportation needs:    Medical: No    Non-medical: No  Tobacco Use  . Smoking status: Current Every Day Smoker    Types: Cigars  . Smokeless tobacco: Never Used  Substance and Sexual Activity  . Alcohol use: Yes    Alcohol/week: 4.2 oz    Types: 7 Cans of beer per week  . Drug use: No  . Sexual activity: Yes  Lifestyle  . Physical activity:    Days per week: 0 days    Minutes per session: 0 min  . Stress: Not at all  Relationships  . Social connections:    Talks on phone: More than three times a week    Gets together: Once a week    Attends religious service: Never    Active member of club or organization: No    Attends meetings of clubs or organizations: Never    Relationship status: Divorced  . Intimate  partner violence:    Fear of current or ex partner: No    Emotionally abused: No    Physically abused: No    Forced sexual activity: No  Other Topics Concern  . Not on file  Social History Narrative  . Not on file    Review of Systems   as above.  Objective:   Physical Exam  Constitutional: He is oriented to person, place, and time. He appears well-developed and well-nourished.  HENT:  Head: Normocephalic and atraumatic.  Right Ear: Tympanic membrane, external ear and ear canal normal.  Left Ear: Tympanic membrane, external ear and ear canal normal.  Nose: No rhinorrhea.  Mouth/Throat: Oropharynx is clear and moist and mucous membranes are normal. No oropharyngeal exudate or posterior oropharyngeal erythema.  Eyes: Pupils are equal, round, and reactive to light. Conjunctivae and EOM are normal.  Neck: Neck supple. No JVD present. Carotid bruit is not present.  Cardiovascular: Normal rate, regular rhythm, normal heart sounds and intact distal pulses.  No murmur heard. Pulmonary/Chest: Effort normal and breath sounds normal. He has no wheezes. He has no rhonchi. He has no rales.  Abdominal: Soft. There is no tenderness.  Genitourinary:  Genitourinary Comments: Few small skin tags noted on left thigh, right genitalia.  Musculoskeletal: He exhibits no edema (trace pedal ).  Lymphadenopathy:    He has no cervical adenopathy.  Neurological: He is alert and oriented to person, place, and time.  Skin: Skin is warm and dry. No rash noted.  Psychiatric: He has a normal mood and affect. His behavior is normal.  Vitals reviewed.  Vitals:   08/10/17 1401  BP: 131/82  Pulse: 82  Temp: 97.7 F (36.5 C)  TempSrc: Oral  SpO2: 98%  Weight: 281 lb (127.5 kg)  Height: 5' 11.5" (1.816 m)    EKG: Sinus rhythm, no acute findings.   Dg Chest 2 View  Result Date: 08/10/2017 CLINICAL DATA:  Dyspnea for the past 2 weeks. EXAM: CHEST - 2 VIEW COMPARISON:  09/04/2014. FINDINGS: Normal heart  size. Clear lung fields. No bony abnormality. No change from priors. IMPRESSION: No active cardiopulmonary disease. Electronically Signed   By: Staci Righter M.D.   On: 08/10/2017 15:05      Assessment & Plan:   Jordan Hines is a 67 y.o. male DOE (dyspnea on exertion) - Plan: DG Chest 2 View, EKG 12-Lead, Pro b natriuretic peptide, Ambulatory referral to Cardiology  -Deconditioning possible, reassuring x-ray and EKG, but given symptoms will refer to cardiology, check BNP  to rule out CHF but less likely.  ER/RTC precautions if acute worsening.  Nasal congestion  -Continue Atrovent nasal spray and Claritin as expect some improvement with continued use of Atrovent.  RTC precautions if not improving.  Cutaneous skin tags  -Appear to be simple skin tags without concerning features.  Plans to return for separate visit for procedures to have those potentially removed.  Fatigue, unspecified type - Plan: DG Chest 2 View, EKG 12-Lead, Pro b natriuretic peptide, Basic metabolic panel, CBC, TSH, Ambulatory referral to Cardiology  -As above we will have him be evaluated by cardiology, but check CBC, TSH, BMP and BNP to look at other causes of his fatigue.  Still possible deconditioning as contributor.  Hypertension, unspecified type - Plan: Ambulatory referral to Cardiology  -Stable. No med changes.   No orders of the defined types were placed in this encounter.  Patient Instructions   For your nasal congestion, okay to continue the Atrovent nasal spray and Claritin.  If that is allergic related, those symptoms should be improving in the next 1 to 2 weeks.  If not, please follow-up and we can look at other causes.  Skin tags do not appear concerning at this time, but we certainly can remove those.  Please schedule a separate appointment for that procedure.   For weight gain, shortness of breath with exertion, and fatigue, xray and EKG look ok. I will check some blood work as well as refer you to  a cardiologist.  If you have any chest pains or acute worsening shortness of breath, please proceed to the emergency room.  Cutting back on fast food and sugar-containing beverages such as sodas can be helpful for weight loss, but we will look into other causes of that weight gain as discussed.  Thank you for coming in today. Please follow up in next 2 weeks to discuss this further.   Return to the clinic or go to the nearest emergency room if any of your symptoms worsen or new symptoms occur.    Fatigue Fatigue is feeling tired all of the time, a lack of energy, or a lack of motivation. Occasional or mild fatigue is often a normal response to activity or life in general. However, long-lasting (chronic) or extreme fatigue may indicate an underlying medical condition. Follow these instructions at home: Watch your fatigue for any changes. The following actions may help to lessen any discomfort you are feeling:  Talk to your health care provider about how much sleep you need each night. Try to get the required amount every night.  Take medicines only as directed by your health care provider.  Eat a healthy and nutritious diet. Ask your health care provider if you need help changing your diet.  Drink enough fluid to keep your urine clear or pale yellow.  Practice ways of relaxing, such as yoga, meditation, massage therapy, or acupuncture.  Exercise regularly.  Change situations that cause you stress. Try to keep your work and personal routine reasonable.  Do not abuse illegal drugs.  Limit alcohol intake to no more than 1 drink per day for nonpregnant women and 2 drinks per day for men. One drink equals 12 ounces of beer, 5 ounces of wine, or 1 ounces of hard liquor.  Take a multivitamin, if directed by your health care provider.  Contact a health care provider if:  Your fatigue does not get better.  You have a fever.  You have unintentional weight loss or gain.  You  have  headaches.  You have difficulty: ? Falling asleep. ? Sleeping throughout the night.  You feel angry, guilty, anxious, or sad.  You are unable to have a bowel movement (constipation).  You skin is dry.  Your legs or another part of your body is swollen. Get help right away if:  You feel confused.  Your vision is blurry.  You feel faint or pass out.  You have a severe headache.  You have severe abdominal, pelvic, or back pain.  You have chest pain, shortness of breath, or an irregular or fast heartbeat.  You are unable to urinate or you urinate less than normal.  You develop abnormal bleeding, such as bleeding from the rectum, vagina, nose, lungs, or nipples.  You vomit blood.  You have thoughts about harming yourself or committing suicide.  You are worried that you might harm someone else. This information is not intended to replace advice given to you by your health care provider. Make sure you discuss any questions you have with your health care provider. Document Released: 11/24/2006 Document Revised: 07/05/2015 Document Reviewed: 05/31/2013 Elsevier Interactive Patient Education  2018 [place] of Breath, Adult Shortness of breath means you have trouble breathing. Your lungs are organs for breathing. Follow these instructions at home: Pay attention to any changes in your symptoms. Take these actions to help with your condition:  Do not smoke. Smoking can cause shortness of breath. If you need help to quit smoking, ask your doctor.  Avoid things that can make it harder to breathe, such as: ? Mold. ? Dust. ? Air pollution. ? Chemical smells. ? Things that can cause allergy symptoms (allergens), if you have allergies.  Keep your living space clean and free of mold and dust.  Rest as needed. Slowly return to your usual activities.  Take over-the-counter and prescription medicines, including oxygen and inhaled medicines, only as told by your  doctor.  Keep all follow-up visits as told by your doctor. This is important.  Contact a doctor if:  Your condition does not get better as soon as expected.  You have a hard time doing your normal activities, even after you rest.  You have new symptoms. Get help right away if:  You have trouble breathing when you are resting.  You feel light-headed or you faint.  You have a cough that is not helped by medicines.  You cough up blood.  You have pain with breathing.  You have pain in your chest, arms, shoulders, or belly (abdomen).  You have a fever.  You cannot walk up stairs.  You cannot exercise the way you normally do. This information is not intended to replace advice given to you by your health care provider. Make sure you discuss any questions you have with your health care provider. Document Released: 07/16/2007 Document Revised: 02/14/2016 Document Reviewed: 02/14/2016 Elsevier Interactive Patient Education  2017 [employer].   IF you received an x-ray today, you will receive an invoice from [facility]. Please contact [facility] at [phone] with questions or concerns regarding your invoice.   IF you received labwork today, you will receive an invoice from [facility]. Please contact [facility] at [phone] with questions or concerns regarding your invoice.   Our billing staff will not be able to assist you with questions regarding bills from these companies.  You will be contacted with the lab results as soon as they are available. The fastest way to get your results is to activate your  My Chart account. Instructions are located on the last page of this paperwork. If you have not heard from [place] regarding the results in 2 weeks, please contact this office.       Signed,   Merri Ray, MD [facility] [place].  08/13/17 9:48 AM

## 2017-08-11 LAB — CBC
Hematocrit: 41.9 % (ref 37.5–51.0)
Hemoglobin: 14 g/dL (ref 13.0–17.7)
MCH: 29.7 pg (ref 26.6–33.0)
MCHC: 33.4 g/dL (ref 31.5–35.7)
MCV: 89 fL (ref 79–97)
PLATELETS: 222 10*3/uL (ref 150–450)
RBC: 4.71 x10E6/uL (ref 4.14–5.80)
RDW: 15.4 % (ref 12.3–15.4)
WBC: 4.1 10*3/uL (ref 3.4–10.8)

## 2017-08-11 LAB — BASIC METABOLIC PANEL
BUN / CREAT RATIO: 11 (ref 10–24)
BUN: 10 mg/dL (ref 8–27)
CO2: 25 mmol/L (ref 20–29)
CREATININE: 0.95 mg/dL (ref 0.76–1.27)
Calcium: 9.3 mg/dL (ref 8.6–10.2)
Chloride: 101 mmol/L (ref 96–106)
GFR calc Af Amer: 95 mL/min/{1.73_m2} (ref >59)
GFR, EST NON AFRICAN AMERICAN: 82 mL/min/{1.73_m2} (ref >59)
Glucose: 118 mg/dL — ABNORMAL HIGH (ref 65–99)
POTASSIUM: 4.4 mmol/L (ref 3.5–5.2)
SODIUM: 138 mmol/L (ref 134–144)

## 2017-08-11 LAB — PRO B NATRIURETIC PEPTIDE: NT-Pro BNP: 32 pg/mL (ref 0–376)

## 2017-08-11 LAB — TSH: TSH: 1.62 u[IU]/mL (ref 0.450–4.500)

## 2017-08-13 ENCOUNTER — Encounter: Payer: Self-pay | Admitting: Family Medicine

## 2017-08-17 ENCOUNTER — Telehealth: Payer: Self-pay | Admitting: Family Medicine

## 2017-08-17 NOTE — Telephone Encounter (Signed)
Dr Carlota Raspberry, patient is calling for the results of labs done on 08/10/2017. Please advise, thank you.

## 2017-08-17 NOTE — Telephone Encounter (Signed)
Copied from [place] [id number]. Topic: Quick Communication - Lab Results >> Aug 17, 2017 12:05 PM Oliver Pila B wrote: Pt needs a nurse to call him to go over his latest labs, contact pt to advise

## 2017-09-21 ENCOUNTER — Ambulatory Visit: Payer: Medicare HMO | Admitting: Family Medicine

## 2017-11-13 ENCOUNTER — Other Ambulatory Visit: Payer: Self-pay | Admitting: Family Medicine

## 2017-11-13 DIAGNOSIS — R0981 Nasal congestion: Secondary | ICD-10-CM

## 2017-11-13 NOTE — Telephone Encounter (Signed)
Requested medication (s) are due for refill today: No was ordered 4 months ago  Requested medication (s) are on the active medication list: yes  Last refill:  07/13/17  Future visit scheduled: No  Notes to clinic:      Requested Prescriptions  Pending Prescriptions Disp Refills   predniSONE (DELTASONE) 20 MG tablet [Pharmacy Med Name: PREDNISONE 20MG  TABLETS] 10 tablet 0    Sig: TAKE 2 TABLETS(40 MG) BY MOUTH DAILY WITH BREAKFAST     Not Delegated - Endocrinology:  Oral Corticosteroids Failed - 11/13/2017  5:58 PM      Failed - This refill cannot be delegated      Passed - Last BP in normal range    BP Readings from Last 1 Encounters:  08/10/17 131/82         Passed - Valid encounter within last 6 months    Recent Outpatient Visits          3 months ago DOE (dyspnea on exertion)   [facility] [place], Ranell Patrick, MD   4 months ago Allergic rhinitis, unspecified seasonality, unspecified trigger   [facility] [place], Ranell Patrick, MD   4 months ago Allergic rhinitis, unspecified seasonality, unspecified trigger   [facility] [place], [place]   7 months ago Essential hypertension   [facility] [place] [facility], Ranell Patrick, MD   9 months ago Acute cystitis without hematuria   [facility] [place], [place], [place]      Future Appointments            In 4 months [facility] [place], [facility]

## 2017-11-18 ENCOUNTER — Ambulatory Visit (INDEPENDENT_AMBULATORY_CARE_PROVIDER_SITE_OTHER): Payer: Medicare HMO | Admitting: Family Medicine

## 2017-11-18 ENCOUNTER — Encounter: Payer: Self-pay | Admitting: Family Medicine

## 2017-11-18 ENCOUNTER — Ambulatory Visit: Payer: Medicare HMO | Admitting: Family Medicine

## 2017-11-18 ENCOUNTER — Other Ambulatory Visit: Payer: Self-pay

## 2017-11-18 VITALS — BP 114/77 | HR 75 | Temp 97.9°F | Ht 72.0 in | Wt 273.6 lb

## 2017-11-18 DIAGNOSIS — R0981 Nasal congestion: Secondary | ICD-10-CM | POA: Diagnosis not present

## 2017-11-18 DIAGNOSIS — Z23 Encounter for immunization: Secondary | ICD-10-CM

## 2017-11-18 DIAGNOSIS — J309 Allergic rhinitis, unspecified: Secondary | ICD-10-CM

## 2017-11-18 MED ORDER — IPRATROPIUM BROMIDE 0.06 % NA SOLN: 2.0000 | Freq: Four times a day (QID) | NASAL | 5 refills | Status: DC | PRN

## 2017-11-18 MED ORDER — FLUTICASONE PROPIONATE 50 MCG/ACT NA SUSP: 1.0000 | Freq: Every day | NASAL | 6 refills | Status: DC

## 2017-11-18 NOTE — Progress Notes (Signed)
10/9/20193:24 PM  Jordan Hines May 01, 1950, 67 y.o. male [id number]  Chief Complaint  Patient presents with  . Nasal Congestion    requesting referral for allergy specialist due to reoccurance of this problem    HPI:   Patient is a 67 y.o. male with past medical history significant for depression and seasonal allergies who presents today requesting referral  Allergies have been going on for a "bit while" Needs refills of his meds: flonase and atrovent, he was doing better Has been taking OTC meds: flonase and claritin daily Still having runny nose, congestion, sneezing He would like to see a specialist Has never had allergy testing or workup before  Fall Risk  11/18/2017 08/10/2017 07/13/2017 06/25/2017 03/27/2017  Falls in the past year? No No No No No     Depression screen [facility] 2/9 11/18/2017 08/10/2017 07/13/2017  Decreased Interest 0 0 0  Down, Depressed, Hopeless 0 0 0  PHQ - 2 Score 0 0 0  Altered sleeping - - -  Tired, decreased energy - - -  Change in appetite - - -  Feeling bad or failure about yourself  - - -  Trouble concentrating - - -  Moving slowly or fidgety/restless - - -  Suicidal thoughts - - -  PHQ-9 Score - - -  Difficult doing work/chores - - -    No Known Allergies  Prior to Admission medications   Medication Sig Start Date End Date Taking? Authorizing Provider  lisinopril (PRINIVIL,ZESTRIL) 10 MG tablet TAKE 1 TABLET EVERY DAY 03/24/17  Yes [name], MD  metoprolol succinate (TOPROL-XL) 50 MG 24 hr tablet TAKE 1 TABLET (50 MG) DAILY WITH OR IMMEDIATELY FOLLOWING A MEAL 03/24/17  Yes [name], MD  sertraline (ZOLOFT) 50 MG tablet Take 1 tablet (50 mg total) by mouth daily. 03/24/17  Yes [name], MD  fluticasone [facility]) 50 MCG/ACT nasal spray Place 1-2 sprays into both nostrils daily. Patient not taking: Reported on 11/18/2017 06/25/17   [name], MD  ipratropium (ATROVENT) 0.06 % nasal spray Place 2 sprays into both  nostrils 4 (four) times daily as needed for rhinitis. Patient not taking: Reported on 11/18/2017 07/13/17   [name], MD    Past Medical History:  Diagnosis Date  . Anxiety   . Arthritis   . Cancer ([place])   . Depression   . Hypertension     Past Surgical History:  Procedure Laterality Date  . PROSTATE SURGERY      Social History   Tobacco Use  . Smoking status: Current Every Day Smoker    Types: Cigars  . Smokeless tobacco: Never Used  Substance Use Topics  . Alcohol use: Yes    Alcohol/week: 7.0 standard drinks    Types: 7 Cans of beer per week    Family History  Problem Relation Age of Onset  . Heart disease Mother   . Cancer Father   . Diabetes Father     ROS Per hpi  OBJECTIVE:  Blood pressure 114/77, pulse 75, temperature 97.9 F (36.6 C), temperature source Oral, height 6' (1.829 m), weight 273 lb 9.6 oz (124.1 kg), SpO2 95 %. Body mass index is 37.11 kg/m.   Physical Exam  Constitutional: He is oriented to person, place, and time. He appears well-developed and well-nourished.  HENT:  Head: Normocephalic and atraumatic.  Mouth/Throat: Oropharynx is clear and moist.  Eyes: Pupils are equal, round, and reactive to light. Conjunctivae and EOM are  normal.  Neck: Neck supple.  Pulmonary/Chest: Effort normal.  Neurological: He is alert and oriented to person, place, and time.  Skin: Skin is warm and dry.  Psychiatric: He has a normal mood and affect.  Nursing note and vitals reviewed.    ASSESSMENT and PLAN  1. Allergic rhinitis, unspecified seasonality, unspecified trigger - fluticasone (FLONASE) 50 MCG/ACT nasal spray; Place 1-2 sprays into both nostrils daily. Referral placed as requested  2. Need for prophylactic vaccination and inoculation against influenza - Flu vaccine HIGH DOSE PF (Fluzone High dose)  3. Nasal congestion - ipratropium (ATROVENT) 0.06 % nasal spray; Place 2 sprays into both nostrils 4 (four) times daily as needed for  rhinitis.    Return if symptoms worsen or fail to improve.    Rutherford Guys, MD Primary Care at [facility] [place], [place] [address] Ph.  [phone] Fax [phone]

## 2017-11-18 NOTE — Patient Instructions (Addendum)
[facility] [address], [place], [place], [place] [address]  Phone: [phone]     If you have lab work done today you will be contacted with your lab results within the next 2 weeks.  If you have not heard from [place] then please contact us. The fastest way to get your results is to register for My Chart.   IF you received an x-ray today, you will receive an invoice from [facility]. Please contact [facility] at [phone] with questions or concerns regarding your invoice.   IF you received labwork today, you will receive an invoice from [facility]. Please contact [facility] at [phone] with questions or concerns regarding your invoice.   Our billing staff will not be able to assist you with questions regarding bills from these companies.  You will be contacted with the lab results as soon as they are available. The fastest way to get your results is to activate your My Chart account. Instructions are located on the last page of this paperwork. If you have not heard from [place] regarding the results in 2 weeks, please contact this office.

## 2018-01-06 ENCOUNTER — Ambulatory Visit: Payer: Medicare HMO | Admitting: Allergy

## 2018-01-06 ENCOUNTER — Encounter: Payer: Self-pay | Admitting: Allergy

## 2018-01-06 VITALS — BP 116/80 | HR 72 | Temp 97.8°F | Resp 20 | Ht 71.0 in | Wt 270.2 lb

## 2018-01-06 DIAGNOSIS — J310 Chronic rhinitis: Secondary | ICD-10-CM

## 2018-01-06 NOTE — Assessment & Plan Note (Addendum)
Rhinitis symptoms for the past 5 months with no variation with seasonal changes.  Currently on Flonase 2 spray once a day and Atrovent nasal spray as needed with some benefit.  No previous allergy work-up or ENT evaluation.  Did not notices any specific triggers.  Today's skin testing showed: Negative to environmental allergens.  Patient most likely has nonallergic rhinitis. He is also a smoker which may be aggravating his symptoms.   Continue Flonase 1-2 sprays daily.  Continue Atrovent 2 sprays as needed every 6 hours.  Refer to ENT for further evaluation.

## 2018-01-06 NOTE — Patient Instructions (Addendum)
Non-allergic rhinitis Rhinitis symptoms for the past 5 months with no variation with seasonal changes.  Currently on Flonase 2 spray once a day and Atrovent nasal spray as needed with some benefit.  No previous allergy work-up or ENT evaluation.  Did not notices any specific triggers.  Today's skin testing showed: Negative to environmental allergens.  Patient most likely has nonallergic rhinitis. He is also a smoker which may be aggravating his symptoms.   Continue Flonase 1-2 sprays daily.  Continue Atrovent 2 sprays as needed every 6 hours.  Refer to ENT for further evaluation.  Return if symptoms worsen or fail to improve.

## 2018-01-06 NOTE — Progress Notes (Signed)
New Patient Note  RE: Jordan Hines MRN: [id number] DOB: Nov 11, 1950 Date of Office Visit: 01/06/2018  Referring provider: Rutherford Guys, MD Primary care provider: [name], MD  Chief Complaint: Other (sneezing and running nose last couple of months)  History of Present Illness: I had the pleasure of seeing Jordan Hines for initial evaluation at the [facility] [place] [facility] [place] on 01/06/2018. He is a 67 y.o. male, who is referred here by [name], MD for the evaluation of seasonal allergies.  Rhinitis: He reports symptoms of rhinorrhea, sneezing, nasal congestion. Symptoms have been going on for 5 months. The symptoms have not changed over the seasons. Other triggers include exposure to none. Anosmia: no. Headache: no. He has used Flonase 2 sprays once a day, Atrovent prn with some improvement in symptoms. Sinus infections: none. Previous work up includes: none. Previous ENT evaluation: no. Previous sinus imaging: no.  Assessment and Plan: Jordan Hines is a 67 y.o. male with: Non-allergic rhinitis Rhinitis symptoms for the past 5 months with no variation with seasonal changes.  Currently on Flonase 2 spray once a day and Atrovent nasal spray as needed with some benefit.  No previous allergy work-up or ENT evaluation.  Did not notices any specific triggers.  Today's skin testing showed: Negative to environmental allergens.  Patient most likely has nonallergic rhinitis. He is also a smoker which may be aggravating his symptoms.   Continue Flonase 1-2 sprays daily.  Continue Atrovent 2 sprays as needed every 6 hours.  Refer to ENT for further evaluation.  Return if symptoms worsen or fail to improve.  Other allergy screening: Asthma: no Rhino conjunctivitis: yes Food allergy: no Medication allergy: no Hymenoptera allergy: no Urticaria: no Eczema:no History of recurrent infections suggestive of immunodeficency: no  Diagnostics: Skin Testing:  Environmental allergy panel. Negative test to: environmental allergies.  Results discussed with patient/family. Airborne Adult Perc - 01/06/18 1451    Time Antigen Placed  1415    Allergen Manufacturer  Lavella Hammock    Location  Back    Number of Test  59    Panel 1  Select    1. Control-Buffer 50% Glycerol  Negative    2. Control-Histamine 1 mg/ml  2+    3. Albumin saline  Negative    4. Ridgecrest  Negative    5. Guatemala  Negative    6. Johnson  Negative    7. Rutland Blue  Negative    8. Meadow Fescue  Negative    9. Perennial Rye  Negative    10. Sweet Vernal  Negative    11. Timothy  Negative    12. Cocklebur  Negative    13. Burweed Marshelder  Negative    14. Ragweed, short  Negative    15. Ragweed, Giant  Negative    16. Plantain,  English  Negative    17. Lamb's Quarters  Negative    18. Sheep Sorrell  Negative    19. Rough Pigweed  Negative    20. Marsh Elder, Rough  Negative    21. Mugwort, Common  Negative    22. Ash mix  Negative    23. Birch mix  Negative    24. Beech American  Negative    25. Box, Elder  Negative    26. Cedar, red  Negative    27. Cottonwood, [place]  Negative    28. Elm mix  Negative    29. Hickory mix  Negative  30. Maple mix  Negative    31. Oak, [place] mix  Negative    32. Pecan Pollen  Negative    33. Pine mix  Negative    34. Sycamore Eastern  Negative    35. Pillsbury, Black Pollen  Negative    36. Alternaria alternata  Negative    37. Cladosporium Herbarum  Negative    38. Aspergillus mix  Negative    39. Penicillium mix  Negative    40. Bipolaris sorokiniana (Helminthosporium)  Negative    41. Drechslera spicifera (Curvularia)  Negative    42. Mucor plumbeus  Negative    43. Fusarium moniliforme  Negative    44. Aureobasidium pullulans (pullulara)  Negative    45. Rhizopus oryzae  Negative    46. Botrytis cinera  Negative    47. Epicoccum nigrum  Negative    48. Phoma betae  Negative    49. Candida Albicans  Negative    50.  Trichophyton mentagrophytes  Negative    51. Mite, D Farinae  5,000 AU/ml  Negative    52. Mite, D Pteronyssinus  5,000 AU/ml  Negative    53. Cat Hair 10,000 BAU/ml  Negative    54.  Dog Epithelia  Negative    55. Mixed Feathers  Negative    56. Horse Epithelia  Negative    57. Cockroach, German  Negative    58. Mouse  Negative    59. Tobacco Leaf  Negative     Intradermal - 01/06/18 1502    Time Antigen Placed  1445    Allergen Manufacturer  Lavella Hammock    Location  Arm    Number of Test  15    Control  Negative    Guatemala  Negative    Johnson  Negative    7 Grass  Negative    Ragweed mix  Negative    Weed mix  Negative    Tree mix  Negative    Mold 1  Negative    Mold 2  Negative    Mold 3  Negative    Mold 4  Negative    Cat  Negative    Dog  Negative    Cockroach  Negative    Mite mix  Negative       Past Medical History: Patient Active Problem List   Diagnosis Date Noted  . Non-allergic rhinitis 01/06/2018   Past Medical History:  Diagnosis Date  . Anxiety   . Arthritis   . Cancer ([place])   . Depression   . Hypertension    Past Surgical History: Past Surgical History:  Procedure Laterality Date  . PROSTATE SURGERY     Medication List:  Current Outpatient Medications  Medication Sig Dispense Refill  . fluticasone (FLONASE) 50 MCG/ACT nasal spray Place 1-2 sprays into both nostrils daily. 16 g 6  . ipratropium (ATROVENT) 0.06 % nasal spray Place 2 sprays into both nostrils 4 (four) times daily as needed for rhinitis. 15 mL 5  . lisinopril (PRINIVIL,ZESTRIL) 10 MG tablet TAKE 1 TABLET EVERY DAY 90 tablet 2  . metoprolol succinate (TOPROL-XL) 50 MG 24 hr tablet TAKE 1 TABLET (50 MG) DAILY WITH OR IMMEDIATELY FOLLOWING A MEAL 90 tablet 2  . sertraline (ZOLOFT) 50 MG tablet Take 1 tablet (50 mg total) by mouth daily. 90 tablet 2   No current facility-administered medications for this visit.    Allergies: No Known Allergies Social History: Social History    Socioeconomic History  .  Marital status: Divorced    Spouse name: Not on file  . Number of children: 1  . Years of education: Not on file  . Highest education level: Some college, no degree  Occupational History  . Not on file  Social Needs  . Financial resource strain: Not hard at all  . Food insecurity:    Worry: Never true    Inability: Never true  . Transportation needs:    Medical: No    Non-medical: No  Tobacco Use  . Smoking status: Current Every Day Smoker    Types: Cigars  . Smokeless tobacco: Never Used  Substance and Sexual Activity  . Alcohol use: Yes    Alcohol/week: 7.0 standard drinks    Types: 7 Cans of beer per week  . Drug use: No  . Sexual activity: Yes  Lifestyle  . Physical activity:    Days per week: 0 days    Minutes per session: 0 min  . Stress: Not at all  Relationships  . Social connections:    Talks on phone: More than three times a week    Gets together: Once a week    Attends religious service: Never    Active member of club or organization: No    Attends meetings of clubs or organizations: Never    Relationship status: Divorced  Other Topics Concern  . Not on file  Social History Narrative  . Not on file   Lives in a 69 year old home. Smoking: yes 1/2ppd x 39 years Occupation: retired, part time Leisure centre manager History: Environmental education officer in the house: yes Carpet in the family room: yes Carpet in the bedroom: yes Heating: gas Cooling: central and window Pet: no  Family History: Family History  Problem Relation Age of Onset  . Heart disease Mother   . Asthma Mother   . Cancer Father   . Diabetes Father   . Allergic rhinitis Neg Hx   . Eczema Neg Hx   . Urticaria Neg Hx    Problem                               Relation Asthma                                   No  Eczema                                No  Food allergy                          No  Allergic rhino conjunctivitis     No   Review of  Systems  Constitutional: Negative for appetite change, chills, fever and unexpected weight change.  HENT: Positive for congestion, rhinorrhea and sneezing.   Eyes: Negative for itching.  Respiratory: Negative for cough, chest tightness, shortness of breath and wheezing.   Cardiovascular: Negative for chest pain.  Gastrointestinal: Negative for abdominal pain.  Genitourinary: Negative for difficulty urinating.  Skin: Negative for rash.  Allergic/Immunologic: Negative for food allergies.  Neurological: Negative for headaches.   Objective: BP 116/80 (BP Location: Left Arm, Patient Position: Sitting, Cuff Size: Large)   Pulse 72   Temp 97.8 F (36.6 C) (Oral)   Resp 20  Ht 5\' 11"  (1.803 m)   Wt 270 lb 3.2 oz (122.6 kg)   SpO2 98%   BMI 37.69 kg/m  Body mass index is 37.69 kg/m. Physical Exam  Constitutional: He is oriented to person, place, and time. He appears well-developed and well-nourished.  HENT:  Head: Normocephalic and atraumatic.  Right Ear: External ear normal.  Left Ear: External ear normal.  Nose: Nose normal.  Mouth/Throat: Oropharynx is clear and moist.  Eyes: Conjunctivae and EOM are normal.  Neck: Neck supple.  Cardiovascular: Normal rate, regular rhythm and normal heart sounds. Exam reveals no gallop and no friction rub.  No murmur heard. Pulmonary/Chest: Effort normal and breath sounds normal. He has no wheezes. He has no rales.  Abdominal: Soft. Bowel sounds are normal. There is no tenderness.  Lymphadenopathy:    He has no cervical adenopathy.  Neurological: He is alert and oriented to person, place, and time.  Skin: Skin is warm. No rash noted.  Psychiatric: He has a normal mood and affect. His behavior is normal.  Nursing note and vitals reviewed.  The plan was reviewed with the patient/family, and all questions/concerned were addressed.  It was my pleasure to see Jordan Hines today and participate in his care. Please feel free to contact me with any  questions or concerns.  Sincerely,  Rexene Alberts, DO Allergy & Immunology  [facility] office: [phone] [place]

## 2018-01-11 ENCOUNTER — Telehealth: Payer: Self-pay | Admitting: *Deleted

## 2018-01-11 NOTE — Telephone Encounter (Signed)
-----   Message from Garnet Sierras, DO sent at 01/07/2018  8:32 AM EST ----- Regarding: ENT referral Please place ENT referral. Diagnosis: non-allergic rhinitis  Thank you.

## 2018-01-13 NOTE — Telephone Encounter (Signed)
Referral placed to Dr Velvet Bathe office.

## 2018-02-04 ENCOUNTER — Telehealth: Payer: Self-pay | Admitting: Family Medicine

## 2018-02-04 DIAGNOSIS — F329 Major depressive disorder, single episode, unspecified: Secondary | ICD-10-CM

## 2018-02-04 DIAGNOSIS — I10 Essential (primary) hypertension: Secondary | ICD-10-CM

## 2018-02-04 DIAGNOSIS — F32A Depression, unspecified: Secondary | ICD-10-CM

## 2018-02-09 NOTE — Telephone Encounter (Signed)
Pt contacted and informed that refills for sertraline, lisinopril, and metoprolol were sent to to [facility] 02/04/18; informed pt that the turn around time for mail order is 5-7 business days; he  verbalizes understanding.

## 2018-02-09 NOTE — Telephone Encounter (Signed)
Pt called in to follow up on refill request for medications. Request was sent in on 02/04/18, please advise/assist pt further.

## 2018-02-26 ENCOUNTER — Ambulatory Visit (INDEPENDENT_AMBULATORY_CARE_PROVIDER_SITE_OTHER): Payer: Medicare HMO | Admitting: Emergency Medicine

## 2018-02-26 ENCOUNTER — Other Ambulatory Visit: Payer: Self-pay

## 2018-02-26 ENCOUNTER — Encounter: Payer: Self-pay | Admitting: Emergency Medicine

## 2018-02-26 VITALS — BP 147/91 | HR 78 | Temp 97.9°F | Resp 16 | Ht 71.0 in | Wt 265.8 lb

## 2018-02-26 DIAGNOSIS — M109 Gout, unspecified: Secondary | ICD-10-CM | POA: Insufficient documentation

## 2018-02-26 MED ORDER — PREDNISONE 20 MG PO TABS: 40.0000 mg | ORAL_TABLET | Freq: Every day | ORAL | 0 refills | Status: AC

## 2018-02-26 MED ORDER — COLCHICINE 0.6 MG PO TABS: ORAL_TABLET | ORAL | 1 refills | Status: DC

## 2018-02-26 MED ORDER — TRAMADOL HCL 50 MG PO TABS: 50.0000 mg | ORAL_TABLET | Freq: Three times a day (TID) | ORAL | 0 refills | Status: DC | PRN

## 2018-02-26 NOTE — Patient Instructions (Addendum)
If you have lab work done today you will be contacted with your lab results within the next 2 weeks.  If you have not heard from [place] then please contact us. The fastest way to get your results is to register for My Chart.   IF you received an x-ray today, you will receive an invoice from [facility]. Please contact [facility] at [phone] with questions or concerns regarding your invoice.   IF you received labwork today, you will receive an invoice from [place]. Please contact [facility] at [phone] with questions or concerns regarding your invoice.   Our billing staff will not be able to assist you with questions regarding bills from these companies.  You will be contacted with the lab results as soon as they are available. The fastest way to get your results is to activate your My Chart account. Instructions are located on the last page of this paperwork. If you have not heard from [place] regarding the results in 2 weeks, please contact this office.     Gout  Gout is painful swelling of your joints. Gout is a type of arthritis. It is caused by having too much uric acid in your body. Uric acid is a chemical that is made when your body breaks down substances called purines. If your body has too much uric acid, sharp crystals can form and build up in your joints. This causes pain and swelling. Gout attacks can happen quickly and be very painful (acute gout). Over time, the attacks can affect more joints and happen more often (chronic gout). What are the causes?  Too much uric acid in your blood. This can happen because: ? Your kidneys do not remove enough uric acid from your blood. ? Your body makes too much uric acid. ? You eat too many foods that are high in purines. These foods include organ meats, some seafood, and beer.  Trauma or stress. What increases the risk?  Having a family history of gout.  Being male and middle-aged.  Being male and having gone  through menopause.  Being very overweight (obese).  Drinking alcohol, especially beer.  Not having enough water in the body (being dehydrated).  Losing weight too quickly.  Having an organ transplant.  Having lead poisoning.  Taking certain medicines.  Having kidney disease.  Having a skin condition called psoriasis. What are the signs or symptoms? An attack of acute gout usually happens in just one joint. The most common place is the big toe. Attacks often start at night. Other joints that may be affected include joints of the feet, ankle, knee, fingers, wrist, or elbow. Symptoms of an attack may include:  Very bad pain.  Warmth.  Swelling.  Stiffness.  Shiny, red, or purple skin.  Tenderness. The affected joint may be very painful to touch.  Chills and fever. Chronic gout may cause symptoms more often. More joints may be involved. You may also have white or yellow lumps (tophi) on your hands or feet or in other areas near your joints. How is this treated?  Treatment for this condition has two phases: treating an acute attack and preventing future attacks.  Acute gout treatment may include: ? NSAIDs. ? Steroids. These are taken by mouth or injected into a joint. ? Colchicine. This medicine relieves pain and swelling. It can be given by mouth or through an IV tube.  Preventive treatment may include: ? Taking small doses of NSAIDs or colchicine daily. ? Using a  medicine that reduces uric acid levels in your blood. ? Making changes to your diet. You may need to see a food expert (dietitian) about what to eat and drink to prevent gout. Follow these instructions at home: During a gout attack   If told, put ice on the painful area: ? Put ice in a plastic bag. ? Place a towel between your skin and the bag. ? Leave the ice on for 20 minutes, 2-3 times a day.  Raise (elevate) the painful joint above the level of your heart as often as you can.  Rest the joint as  much as possible. If the joint is in your leg, you may be given crutches.  Follow instructions from your doctor about what you cannot eat or drink. Avoiding future gout attacks  Eat a low-purine diet. Avoid foods and drinks such as: ? Liver. ? Kidney. ? Anchovies. ? Asparagus. ? Herring. ? Mushrooms. ? Mussels. ? Beer.  Stay at a healthy weight. If you want to lose weight, talk with your doctor. Do not lose weight too fast.  Start or continue an exercise plan as told by your doctor. Eating and drinking  Drink enough fluids to keep your pee (urine) pale yellow.  If you drink alcohol: ? Limit how much you use to:  0-1 drink a day for women.  0-2 drinks a day for men. ? Be aware of how much alcohol is in your drink. In the [place] one drink equals one 12 oz bottle of beer (355 mL), one 5 oz glass of wine (148 mL), or one 1 oz glass of hard liquor (44 mL). General instructions  Take over-the-counter and prescription medicines only as told by your doctor.  Do not drive or use heavy machinery while taking prescription pain medicine.  Return to your normal activities as told by your doctor. Ask your doctor what activities are safe for you.  Keep all follow-up visits as told by your doctor. This is important. Contact a doctor if:  You have another gout attack.  You still have symptoms of a gout attack after 10 days of treatment.  You have problems (side effects) because of your medicines.  You have chills or a fever.  You have burning pain when you pee (urinate).  You have pain in your lower back or belly. Get help right away if:  You have very bad pain.  Your pain cannot be controlled.  You cannot pee. Summary  Gout is painful swelling of the joints.  The most common site of pain is the big toe, but it can affect other joints.  Medicines and avoiding some foods can help to prevent and treat gout attacks. This information is not intended to replace advice given  to you by your health care provider. Make sure you discuss any questions you have with your health care provider. Document Released: 11/06/2007 Document Revised: 08/19/2017 Document Reviewed: 08/19/2017 Elsevier Interactive Patient Education  2019 [employer].

## 2018-02-26 NOTE — Progress Notes (Signed)
Jordan Hines 68 y.o.   Chief Complaint  Patient presents with  . Toe Pain    RIGHT GREAT per patient from  01/29/2018 to present    HISTORY OF PRESENT ILLNESS: This is a 68 y.o. male complaining of severe pain to right great toe on and off for the past 3 weeks.  Denies injuries.  No other significant symptoms.  HPI   Prior to Admission medications   Medication Sig Start Date End Date Taking? Authorizing Provider  fluticasone (FLONASE) 50 MCG/ACT nasal spray Place 1-2 sprays into both nostrils daily. 11/18/17  Yes Jordan Guys, MD  ipratropium (ATROVENT) 0.06 % nasal spray Place 2 sprays into both nostrils 4 (four) times daily as needed for rhinitis. 11/18/17  Yes Jordan Guys, MD  lisinopril (PRINIVIL,ZESTRIL) 10 MG tablet TAKE 1 TABLET EVERY DAY 02/04/18  Yes Jordan Agreste, MD  metoprolol succinate (TOPROL-XL) 50 MG 24 hr tablet TAKE 1 TABLET (50 MG) DAILY WITH OR IMMEDIATELY FOLLOWING A MEAL 02/04/18  Yes Jordan Agreste, MD  sertraline (ZOLOFT) 50 MG tablet TAKE 1 TABLET (50 MG TOTAL) BY MOUTH DAILY. 02/04/18  Yes Jordan Agreste, MD    No Known Allergies  Patient Active Problem List   Diagnosis Date Noted  . Non-allergic rhinitis 01/06/2018    Past Medical History:  Diagnosis Date  . Anxiety   . Arthritis   . Cancer ([place])   . Depression   . Hypertension     Past Surgical History:  Procedure Laterality Date  . PROSTATE SURGERY      Social History   Socioeconomic History  . Marital status: Divorced    Spouse name: Not on file  . Number of children: 1  . Years of education: Not on file  . Highest education level: Some college, no degree  Occupational History  . Not on file  Social Needs  . Financial resource strain: Not hard at all  . Food insecurity:    Worry: Never true    Inability: Never true  . Transportation needs:    Medical: No    Non-medical: No  Tobacco Use  . Smoking status: Current Every Day Smoker    Types: Cigars  .  Smokeless tobacco: Never Used  Substance and Sexual Activity  . Alcohol use: Yes    Alcohol/week: 7.0 standard drinks    Types: 7 Cans of beer per week  . Drug use: No  . Sexual activity: Yes  Lifestyle  . Physical activity:    Days per week: 0 days    Minutes per session: 0 min  . Stress: Not at all  Relationships  . Social connections:    Talks on phone: More than three times a week    Gets together: Once a week    Attends religious service: Never    Active member of club or organization: No    Attends meetings of clubs or organizations: Never    Relationship status: Divorced  . Intimate partner violence:    Fear of current or ex partner: No    Emotionally abused: No    Physically abused: No    Forced sexual activity: No  Other Topics Concern  . Not on file  Social History Narrative  . Not on file    Family History  Problem Relation Age of Onset  . Heart disease Mother   . Asthma Mother   . Cancer Father   . Diabetes Father   . Allergic rhinitis Neg  Hx   . Eczema Neg Hx   . Urticaria Neg Hx      Review of Systems  Constitutional: Negative.  Negative for fever.  HENT: Negative.   Respiratory: Negative.  Negative for cough and shortness of breath.   Cardiovascular: Negative.  Negative for chest pain and palpitations.  Gastrointestinal: Negative.  Negative for abdominal pain, diarrhea, nausea and vomiting.  Genitourinary: Negative.  Negative for dysuria.  Musculoskeletal: Positive for joint pain (Right great toe).  Skin: Negative.  Negative for rash.  Neurological: Negative.  Negative for dizziness and headaches.  Endo/Heme/Allergies: Negative.   All other systems reviewed and are negative.   Vitals:   02/26/18 1705  BP: (!) 147/91  Pulse: 78  Resp: 16  Temp: 97.9 F (36.6 C)  SpO2: 99%    Physical Exam Vitals signs reviewed.  Constitutional:      Appearance: Normal appearance.  HENT:     Head: Normocephalic and atraumatic.  Eyes:      Extraocular Movements: Extraocular movements intact.     Pupils: Pupils are equal, round, and reactive to light.  Neck:     Musculoskeletal: Normal range of motion and neck supple.  Cardiovascular:     Rate and Rhythm: Normal rate and regular rhythm.     Heart sounds: Normal heart sounds.  Pulmonary:     Effort: Pulmonary effort is normal.     Breath sounds: Normal breath sounds.  Musculoskeletal: Normal range of motion.     Comments: Right foot: Positive swelling, erythema, and tenderness to first MTP joint.  Skin:    General: Skin is warm and dry.     Capillary Refill: Capillary refill takes less than 2 seconds.  Neurological:     General: No focal deficit present.     Mental Status: He is alert and oriented to person, place, and time.      A total of 25 minutes was spent in the room with the patient, greater than 50% of which was in counseling/coordination of care regarding diagnosis, treatment, nutrition, medications, and need for follow-up if no better or worse.   ASSESSMENT & PLAN: Jordan Hines was seen today for toe pain.  Diagnoses and all orders for this visit:  Acute gouty arthritis -     colchicine 0.6 MG tablet; Take 1.2 mg now and 0.6 mg one hour later. Take 0.6 mg daily after that x 5 days. -     predniSONE (DELTASONE) 20 MG tablet; Take 2 tablets (40 mg total) by mouth daily with breakfast for 5 days. -     traMADol (ULTRAM) 50 MG tablet; Take 1 tablet (50 mg total) by mouth every 8 (eight) hours as needed for moderate pain or severe pain.    Patient Instructions       If you have lab work done today you will be contacted with your lab results within the next 2 weeks.  If you have not heard from [place] then please contact us. The fastest way to get your results is to register for My Chart.   IF you received an x-ray today, you will receive an invoice from [facility]. Please contact [facility] at [phone] with questions or concerns regarding  your invoice.   IF you received labwork today, you will receive an invoice from [facility]. Please contact [facility] at [phone] with questions or concerns regarding your invoice.   Our billing staff will not be able to assist you with questions regarding bills from these companies.  You  will be contacted with the lab results as soon as they are available. The fastest way to get your results is to activate your My Chart account. Instructions are located on the last page of this paperwork. If you have not heard from [place] regarding the results in 2 weeks, please contact this office.     Gout  Gout is painful swelling of your joints. Gout is a type of arthritis. It is caused by having too much uric acid in your body. Uric acid is a chemical that is made when your body breaks down substances called purines. If your body has too much uric acid, sharp crystals can form and build up in your joints. This causes pain and swelling. Gout attacks can happen quickly and be very painful (acute gout). Over time, the attacks can affect more joints and happen more often (chronic gout). What are the causes?  Too much uric acid in your blood. This can happen because: ? Your kidneys do not remove enough uric acid from your blood. ? Your body makes too much uric acid. ? You eat too many foods that are high in purines. These foods include organ meats, some seafood, and beer.  Trauma or stress. What increases the risk?  Having a family history of gout.  Being male and middle-aged.  Being male and having gone through menopause.  Being very overweight (obese).  Drinking alcohol, especially beer.  Not having enough water in the body (being dehydrated).  Losing weight too quickly.  Having an organ transplant.  Having lead poisoning.  Taking certain medicines.  Having kidney disease.  Having a skin condition called psoriasis. What are the signs or symptoms? An attack of acute gout usually  happens in just one joint. The most common place is the big toe. Attacks often start at night. Other joints that may be affected include joints of the feet, ankle, knee, fingers, wrist, or elbow. Symptoms of an attack may include:  Very bad pain.  Warmth.  Swelling.  Stiffness.  Shiny, red, or purple skin.  Tenderness. The affected joint may be very painful to touch.  Chills and fever. Chronic gout may cause symptoms more often. More joints may be involved. You may also have white or yellow lumps (tophi) on your hands or feet or in other areas near your joints. How is this treated?  Treatment for this condition has two phases: treating an acute attack and preventing future attacks.  Acute gout treatment may include: ? NSAIDs. ? Steroids. These are taken by mouth or injected into a joint. ? Colchicine. This medicine relieves pain and swelling. It can be given by mouth or through an IV tube.  Preventive treatment may include: ? Taking small doses of NSAIDs or colchicine daily. ? Using a medicine that reduces uric acid levels in your blood. ? Making changes to your diet. You may need to see a food expert (dietitian) about what to eat and drink to prevent gout. Follow these instructions at home: During a gout attack   If told, put ice on the painful area: ? Put ice in a plastic bag. ? Place a towel between your skin and the bag. ? Leave the ice on for 20 minutes, 2-3 times a day.  Raise (elevate) the painful joint above the level of your heart as often as you can.  Rest the joint as much as possible. If the joint is in your leg, you may be given crutches.  Follow instructions from your doctor  about what you cannot eat or drink. Avoiding future gout attacks  Eat a low-purine diet. Avoid foods and drinks such as: ? Liver. ? Kidney. ? Anchovies. ? Asparagus. ? Herring. ? Mushrooms. ? Mussels. ? Beer.  Stay at a healthy weight. If you want to lose weight, talk with your  doctor. Do not lose weight too fast.  Start or continue an exercise plan as told by your doctor. Eating and drinking  Drink enough fluids to keep your pee (urine) pale yellow.  If you drink alcohol: ? Limit how much you use to:  0-1 drink a day for women.  0-2 drinks a day for men. ? Be aware of how much alcohol is in your drink. In the [place] one drink equals one 12 oz bottle of beer (355 mL), one 5 oz glass of wine (148 mL), or one 1 oz glass of hard liquor (44 mL). General instructions  Take over-the-counter and prescription medicines only as told by your doctor.  Do not drive or use heavy machinery while taking prescription pain medicine.  Return to your normal activities as told by your doctor. Ask your doctor what activities are safe for you.  Keep all follow-up visits as told by your doctor. This is important. Contact a doctor if:  You have another gout attack.  You still have symptoms of a gout attack after 10 days of treatment.  You have problems (side effects) because of your medicines.  You have chills or a fever.  You have burning pain when you pee (urinate).  You have pain in your lower back or belly. Get help right away if:  You have very bad pain.  Your pain cannot be controlled.  You cannot pee. Summary  Gout is painful swelling of the joints.  The most common site of pain is the big toe, but it can affect other joints.  Medicines and avoiding some foods can help to prevent and treat gout attacks. This information is not intended to replace advice given to you by your health care provider. Make sure you discuss any questions you have with your health care provider. Document Released: 11/06/2007 Document Revised: 08/19/2017 Document Reviewed: 08/19/2017 Elsevier Interactive Patient Education  2019 Elsevier Inc.      Agustina Caroli, MD [facility]

## 2018-03-03 DIAGNOSIS — J342 Deviated nasal septum: Secondary | ICD-10-CM | POA: Diagnosis not present

## 2018-03-03 DIAGNOSIS — J343 Hypertrophy of nasal turbinates: Secondary | ICD-10-CM | POA: Diagnosis not present

## 2018-03-03 DIAGNOSIS — J310 Chronic rhinitis: Secondary | ICD-10-CM | POA: Diagnosis not present

## 2018-03-22 ENCOUNTER — Telehealth: Payer: Self-pay | Admitting: Family Medicine

## 2018-03-22 NOTE — Telephone Encounter (Signed)
Copied from [place] [id number]. Topic: Quick Communication - See Telephone Encounter >> Mar 22, 2018 12:21 PM R[place] Nail, [place] wrote: CRM for notification. See Telephone encounter for: 03/22/18.Jordan Hines Patient calling and states that he was recently seen for gout in his foot. States that there is still some swelling. Would like to know what Dr Mitchel Honour would like for him to do? States that he only took the 1 round of the colchicine 0.6 MG tablet. States that he finished the prednisone, would like to know if he should just take the 2nd round of the colchicine and see if that helps the swelling?

## 2018-03-22 NOTE — Telephone Encounter (Signed)
Patient needs to be called and reschedule for his AWV

## 2018-03-23 NOTE — Telephone Encounter (Signed)
I have called the pt and answered his questions. There should be another refill of Colchicine at his pharmacy to pick up. I  Have informed him that he can pick that up and use it as needed and if that doesn't work and if the swelling doesn't go down anymore then he can make an appointment to come back to get re-evaluated. I have also informed him to avoid foods rich in uric acid and drink lots of water.  Thanks, [employer]

## 2018-03-30 ENCOUNTER — Telehealth: Payer: Self-pay | Admitting: *Deleted

## 2018-03-30 NOTE — Telephone Encounter (Signed)
Left detailed message to give day and time Monday thru Thursday 8 am to 2pm  Would like to come in I will schedule and call back .

## 2018-04-02 ENCOUNTER — Ambulatory Visit: Payer: Medicare HMO

## 2018-04-02 ENCOUNTER — Telehealth: Payer: Self-pay | Admitting: Family Medicine

## 2018-04-02 NOTE — Telephone Encounter (Signed)
Routed to [employer] in error.

## 2018-04-02 NOTE — Telephone Encounter (Signed)
Pt would like an AWV app. He showed up for his 04/02/18 app. Please CB he would like to reschedule for this. RP#[phone]

## 2018-04-06 DIAGNOSIS — C61 Malignant neoplasm of prostate: Secondary | ICD-10-CM | POA: Diagnosis not present

## 2018-04-12 DIAGNOSIS — R351 Nocturia: Secondary | ICD-10-CM | POA: Diagnosis not present

## 2018-04-12 DIAGNOSIS — N320 Bladder-neck obstruction: Secondary | ICD-10-CM | POA: Diagnosis not present

## 2018-04-12 DIAGNOSIS — Z8546 Personal history of malignant neoplasm of prostate: Secondary | ICD-10-CM | POA: Diagnosis not present

## 2018-04-14 ENCOUNTER — Ambulatory Visit (INDEPENDENT_AMBULATORY_CARE_PROVIDER_SITE_OTHER): Payer: Medicare HMO | Admitting: Family Medicine

## 2018-04-14 ENCOUNTER — Ambulatory Visit: Payer: Self-pay

## 2018-04-14 VITALS — BP 115/77 | HR 74 | Ht 73.0 in | Wt 266.6 lb

## 2018-04-14 DIAGNOSIS — Z0000 Encounter for general adult medical examination without abnormal findings: Secondary | ICD-10-CM | POA: Diagnosis not present

## 2018-04-14 NOTE — Patient Instructions (Addendum)
Thank you for taking time to come for your Medicare Wellness Visit. I appreciate your ongoing commitment to your health goals. Please review the following plan we discussed and let me know if I can assist you in the future.  Leroy Kennedy LPN    Healthy Eating Following a healthy eating pattern may help you to achieve and maintain a healthy body weight, reduce the risk of chronic disease, and live a long and productive life. It is important to follow a healthy eating pattern at an appropriate calorie level for your body. Your nutritional needs should be met primarily through food by choosing a variety of nutrient-rich foods. What are tips for following this plan? Reading food labels  Read labels and choose the following: ? Reduced or low sodium. ? Juices with 100% fruit juice. ? Foods with low saturated fats and high polyunsaturated and monounsaturated fats. ? Foods with whole grains, such as whole wheat, cracked wheat, brown rice, and wild rice. ? Whole grains that are fortified with folic acid. This is recommended for women who are pregnant or who want to become pregnant.  Read labels and avoid the following: ? Foods with a lot of added sugars. These include foods that contain brown sugar, corn sweetener, corn syrup, dextrose, fructose, glucose, high-fructose corn syrup, honey, invert sugar, lactose, malt syrup, maltose, molasses, raw sugar, sucrose, trehalose, or turbinado sugar.  Do not eat more than the following amounts of added sugar per day:  6 teaspoons (25 g) for women.  9 teaspoons (38 g) for men. ? Foods that contain processed or refined starches and grains. ? Refined grain products, such as white flour, degermed cornmeal, white bread, and white rice. Shopping  Choose nutrient-rich snacks, such as vegetables, whole fruits, and nuts. Avoid high-calorie and high-sugar snacks, such as potato chips, fruit snacks, and candy.  Use oil-based dressings and spreads on foods instead  of solid fats such as butter, stick margarine, or cream cheese.  Limit pre-made sauces, mixes, and "instant" products such as flavored rice, instant noodles, and ready-made pasta.  Try more plant-protein sources, such as tofu, tempeh, black beans, edamame, lentils, nuts, and seeds.  Explore eating plans such as the Mediterranean diet or vegetarian diet. Cooking  Use oil to saut or stir-fry foods instead of solid fats such as butter, stick margarine, or lard.  Try baking, boiling, grilling, or broiling instead of frying.  Remove the fatty part of meats before cooking.  Steam vegetables in water or broth. Meal planning   At meals, imagine dividing your plate into fourths: ? One-half of your plate is fruits and vegetables. ? One-fourth of your plate is whole grains. ? One-fourth of your plate is protein, especially lean meats, poultry, eggs, tofu, beans, or nuts.  Include low-fat dairy as part of your daily diet. Lifestyle  Choose healthy options in all settings, including home, work, school, restaurants, or stores.  Prepare your food safely: ? Wash your hands after handling raw meats. ? Keep food preparation surfaces clean by regularly washing with hot, soapy water. ? Keep raw meats separate from ready-to-eat foods, such as fruits and vegetables. ? Cook seafood, meat, poultry, and eggs to the recommended internal temperature. ? Store foods at safe temperatures. In general:  Keep cold foods at 3F (4.4C) or below.  Keep hot foods at 13F (60C) or above.  Keep your freezer at [facility] (-17.8C) or below.  Foods are no longer safe to eat when they have been between the temperatures of 40-13F (  4.4-60C) for more than 2 hours. What foods should I eat? Fruits Aim to eat 2 cup-equivalents of fresh, canned (in natural juice), or frozen fruits each day. Examples of 1 cup-equivalent of fruit include 1 small apple, 8 large strawberries, 1 cup canned fruit,  cup dried fruit, or 1  cup 100% juice. Vegetables Aim to eat 2-3 cup-equivalents of fresh and frozen vegetables each day, including different varieties and colors. Examples of 1 cup-equivalent of vegetables include 2 medium carrots, 2 cups raw, leafy greens, 1 cup chopped vegetable (raw or cooked), or 1 medium baked potato. Grains Aim to eat 6 ounce-equivalents of whole grains each day. Examples of 1 ounce-equivalent of grains include 1 slice of bread, 1 cup ready-to-eat cereal, 3 cups popcorn, or  cup cooked rice, pasta, or cereal. Meats and other proteins Aim to eat 5-6 ounce-equivalents of protein each day. Examples of 1 ounce-equivalent of protein include 1 egg, 1/2 cup nuts or seeds, or 1 tablespoon (16 g) peanut butter. A cut of meat or fish that is the size of a deck of cards is about 3-4 ounce-equivalents.  Of the protein you eat each week, try to have at least 8 ounces come from seafood. This includes salmon, trout, herring, and anchovies. Dairy Aim to eat 3 cup-equivalents of fat-free or low-fat dairy each day. Examples of 1 cup-equivalent of dairy include 1 cup (240 mL) milk, 8 ounces (250 g) yogurt, 1 ounces (44 g) natural cheese, or 1 cup (240 mL) fortified soy milk. Fats and oils  Aim for about 5 teaspoons (21 g) per day. Choose monounsaturated fats, such as canola and olive oils, avocados, peanut butter, and most nuts, or polyunsaturated fats, such as sunflower, corn, and soybean oils, walnuts, pine nuts, sesame seeds, sunflower seeds, and flaxseed. Beverages  Aim for six 8-oz glasses of water per day. Limit coffee to three to five 8-oz cups per day.  Limit caffeinated beverages that have added calories, such as soda and energy drinks.  Limit alcohol intake to no more than 1 drink a day for nonpregnant women and 2 drinks a day for men. One drink equals 12 oz of beer (355 mL), 5 oz of wine (148 mL), or 1 oz of hard liquor (44 mL). Seasoning and other foods  Avoid adding excess amounts of salt to  your foods. Try flavoring foods with herbs and spices instead of salt.  Avoid adding sugar to foods.  Try using oil-based dressings, sauces, and spreads instead of solid fats. This information is based on general [place] nutrition guidelines. For more information, visit [employer]. Exact amounts may vary based on your nutrition needs. Summary  A healthy eating plan may help you to maintain a healthy weight, reduce the risk of chronic diseases, and stay active throughout your life.  Plan your meals. Make sure you eat the right portions of a variety of nutrient-rich foods.  Try baking, boiling, grilling, or broiling instead of frying.  Choose healthy options in all settings, including home, work, school, restaurants, or stores. This information is not intended to replace advice given to you by your health care provider. Make sure you discuss any questions you have with your health care provider. Document Released: 05/11/2017 Document Revised: 05/11/2017 Document Reviewed: 05/11/2017 Elsevier Interactive Patient Education  2019 [place] Directive  Advance directives are legal documents that let you make choices ahead of time about your health care and medical treatment in case you become unable to communicate for yourself. Advance directives are  you become unable to communicate for yourself. Advance directives are a way for you to communicate your wishes to family, friends, and health care providers. This can help convey your decisions about end-of-life care if you become unable to communicate. Discussing and writing advance directives should happen over time rather than all at once. Advance directives can be changed depending on your situation and what you want, even after you have signed the advance directives. If you do not have an advance directive, some states assign family decision makers to act on your behalf based on how closely you are related to them. Each state has its own laws regarding advance directives. You  may want to check with your health care provider, attorney, or state representative about the laws in your state. There are different types of advance directives, such as:  Medical power of attorney.  Living will.  Do not resuscitate (DNR) or do not attempt resuscitation (DNAR) order. Health care proxy and medical power of attorney A health care proxy, also called a health care agent, is a person who is appointed to make medical decisions for you in cases in which you are unable to make the decisions yourself. Generally, people choose someone they know well and trust to represent their preferences. Make sure to ask this person for an agreement to act as your proxy. A proxy may have to exercise judgment in the event of a medical decision for which your wishes are not known. A medical power of attorney is a legal document that names your health care proxy. Depending on the laws in your state, after the document is written, it may also need to be:  Signed.  Notarized.  Dated.  Copied.  Witnessed.  Incorporated into your medical record. You may also want to appoint someone to manage your financial affairs in a situation in which you are unable to do so. This is called a durable power of attorney for finances. It is a separate legal document from the durable power of attorney for health care. You may choose the same person or someone different from your health care proxy to act as your agent in financial matters. If you do not appoint a proxy, or if there is a concern that the proxy is not acting in your best interests, a court-appointed guardian may be designated to act on your behalf. Living will A living will is a set of instructions documenting your wishes about medical care when you cannot express them yourself. Health care providers should keep a copy of your living will in your medical record. You may want to give a copy to family members or friends. To alert caregivers in case of an  emergency, you can place a card in your wallet to let them know that you have a living will and where they can find it. A living will is used if you become:  Terminally ill.  Incapacitated.  Unable to communicate or make decisions. Items to consider in your living will include:  The use or non-use of life-sustaining equipment, such as dialysis machines and breathing machines (ventilators).  A DNR or DNAR order, which is the instruction not to use cardiopulmonary resuscitation (CPR) if breathing or heartbeat stops.  The use or non-use of tube feeding.  Withholding of food and fluids.  Comfort (palliative) care when the goal becomes comfort rather than a cure.  Organ and tissue donation. A living will does not give instructions for distributing your money and property if you should   of a lawyer when writing a will. Decisions about taxes, beneficiaries, and asset distribution will be legally binding. This process can relieve your family and friends of any concerns surrounding disputes or questions that may come up about the distribution of your assets. DNR or DNAR A DNR or DNAR order is a request not to have CPR in the event that your heart stops beating or you stop breathing. If a DNR or DNAR order has not been made and shared, a health care provider will try to help any patient whose heart has stopped or who has stopped breathing. If you plan to have surgery, talk with your health care provider about how your DNR or DNAR order will be followed if problems occur. Summary  Advance directives are the legal documents that allow you to make choices ahead of time about your health care and medical treatment in case you become unable to communicate for yourself.  The process of discussing and writing advance directives should happen over time. You can change the advance directives, even after you have signed them.  Advance directives include DNR or DNAR orders, living  wills, and designating an agent as your medical power of attorney. This information is not intended to replace advice given to you by your health care provider. Make sure you discuss any questions you have with your health care provider. Document Released: 05/06/2007 Document Revised: 12/17/2015 Document Reviewed: 12/17/2015 Elsevier Interactive Patient Education  2019 [employer].

## 2018-04-14 NOTE — Progress Notes (Signed)
Presents today for Medicare Annual Wellness Visit    Patient Care Team: [name], MD as PCP - General (Family Medicine) [facility], [facility]     Immunization status:  Immunization History  Administered Date(s) Administered  . Influenza, High Dose Seasonal PF 11/18/2017  . Influenza,inj,Quad PF,6+ Mos 12/11/2014, 03/27/2017  . Influenza-Unspecified 11/16/2017  . Pneumococcal Conjugate-13 09/04/2014  . Pneumococcal Polysaccharide-23 09/17/2010, 03/27/2017  . Tdap 08/21/2008     There are no preventive care reminders to display for this patient.   Functional Status Survey: Is the patient deaf or have difficulty hearing?: No Does the patient have difficulty seeing, even when wearing glasses/contacts?: No Does the patient have difficulty concentrating, remembering, or making decisions?: No Does the patient have difficulty walking or climbing stairs?: No Does the patient have difficulty dressing or bathing?: No Does the patient have difficulty doing errands alone such as visiting a doctor's office or shopping?: No   6CIT Screen 04/14/2018 03/27/2017  What Year? 0 points 0 points  What month? 0 points 0 points  What time? 0 points 0 points  Count back from 20 0 points 0 points  Months in reverse 0 points 0 points  Repeat phrase 0 points 0 points  Total Score 0 0        Clinical Support from 04/14/2018 in Primary Care at [facility]  AUDIT-C Score  4         Patient Active Problem List   Diagnosis Date Noted  . Acute gouty arthritis 02/26/2018  . Non-allergic rhinitis 01/06/2018     Past Medical History:  Diagnosis Date  . Anxiety   . Arthritis   . Cancer ([place])   . Depression   . Hypertension      Past Surgical History:  Procedure Laterality Date  . PROSTATE SURGERY       Family History  Problem Relation Age of Onset  . Heart disease Mother   . Asthma Mother   . Cancer Father   . Diabetes Father   . Allergic rhinitis Neg Hx    . Eczema Neg Hx   . Urticaria Neg Hx      Social History   Socioeconomic History  . Marital status: Divorced    Spouse name: Not on file  . Number of children: 1  . Years of education: Not on file  . Highest education level: Some college, no degree  Occupational History  . Not on file  Social Needs  . Financial resource strain: Not hard at all  . Food insecurity:    Worry: Never true    Inability: Never true  . Transportation needs:    Medical: No    Non-medical: No  Tobacco Use  . Smoking status: Current Every Day Smoker    Types: Cigars  . Smokeless tobacco: Never Used  Substance and Sexual Activity  . Alcohol use: Yes    Alcohol/week: 7.0 standard drinks    Types: 7 Cans of beer per week  . Drug use: No  . Sexual activity: Yes  Lifestyle  . Physical activity:    Days per week: 0 days    Minutes per session: 0 min  . Stress: Not at all  Relationships  . Social connections:    Talks on phone: More than three times a week    Gets together: Once a week    Attends religious service: Never    Active member of club or organization: No    Attends  meetings of clubs or organizations: Never    Relationship status: Divorced  . Intimate partner violence:    Fear of current or ex partner: No    Emotionally abused: No    Physically abused: No    Forced sexual activity: No  Other Topics Concern  . Not on file  Social History Narrative  . Not on file     No Known Allergies   Prior to Admission medications   Medication Sig Start Date End Date Taking? Authorizing Provider  colchicine 0.6 MG tablet Take 1.2 mg now and 0.6 mg one hour later. Take 0.6 mg daily after that x 5 days. 02/26/18  Yes Sagardia, Ines Bloomer, MD  fluticasone [facility]) 50 MCG/ACT nasal spray Place 1-2 sprays into both nostrils daily. 11/18/17  Yes Rutherford Guys, MD  ipratropium (ATROVENT) 0.06 % nasal spray Place 2 sprays into both nostrils 4 (four) times daily as needed for rhinitis. 11/18/17   Yes Rutherford Guys, MD  lisinopril (PRINIVIL,ZESTRIL) 10 MG tablet TAKE 1 TABLET EVERY DAY 02/04/18  Yes [name], MD  metoprolol succinate (TOPROL-XL) 50 MG 24 hr tablet TAKE 1 TABLET (50 MG) DAILY WITH OR IMMEDIATELY FOLLOWING A MEAL 02/04/18  Yes [name], MD  sertraline (ZOLOFT) 50 MG tablet TAKE 1 TABLET (50 MG TOTAL) BY MOUTH DAILY. 02/04/18  Yes [name], MD  traMADol (ULTRAM) 50 MG tablet Take 1 tablet (50 mg total) by mouth every 8 (eight) hours as needed for moderate pain or severe pain. 02/26/18  Yes Horald Pollen, MD     Depression screen [facility] 2/9 04/14/2018 02/26/2018 11/18/2017 08/10/2017 07/13/2017  Decreased Interest 0 0 0 0 0  Down, Depressed, Hopeless 0 0 0 0 0  PHQ - 2 Score 0 0 0 0 0  Altered sleeping - - - - -  Tired, decreased energy - - - - -  Change in appetite - - - - -  Feeling bad or failure about yourself  - - - - -  Trouble concentrating - - - - -  Moving slowly or fidgety/restless - - - - -  Suicidal thoughts - - - - -  PHQ-9 Score - - - - -  Difficult doing work/chores - - - - -     Fall Risk  04/14/2018 02/26/2018 11/18/2017 08/10/2017 07/13/2017  Falls in the past year? 0 0 No No No  Number falls in past yr: 0 - - - -  Injury with Fall? 0 - - - -      PHYSICAL EXAM: BP 115/77   Pulse 74   Ht 6\' 1"  (1.854 m)   Wt 266 lb 9.6 oz (120.9 kg)   BMI 35.17 kg/m    Wt Readings from Last 3 Encounters:  04/14/18 266 lb 9.6 oz (120.9 kg)  02/26/18 265 lb 12.8 oz (120.6 kg)  01/06/18 270 lb 3.2 oz (122.6 kg)     No exam data present    Physical Exam   Education/Counseling provided regarding diet and exercise, prevention of chronic diseases, smoking/tobacco cessation, if applicable, and reviewed "Covered Medicare Preventive Services."   ASSESSMENT/PLAN: There are no diagnoses linked to this encounter.   Hearing/Vision screen Hearing Screening Comments: Patient had a hearing exam completed about 2 years ago and  passed.  Vision Screening Comments: Patient goes to the [place] [facility] for his routine eye exams when he needs his eye glasses updated.   Dietary issues and exercise activities discussed:  Current Exercise Habits: The patient does not participate in regular exercise at present, Exercise limited by: None identified  Screening recommendations/referrals: Colonoscopy: up to date, next due 02/13/2019 Recommended yearly ophthalmology/optometry visit for glaucoma screening and checkup Recommended yearly dental visit for hygiene and checkup  Advanced directives: Advance directive discussed with you today. I have provided a copy for you to complete at home and have notarized. Once this is complete please bring a copy in to our office so we can scan it into your chart.  EXERCISE   : Starting [place] today.  Two floor home lives alone.   No grab bars, no scattered rugs, well lit rooms  Observed 30 second rise from chair no problem

## 2018-04-15 ENCOUNTER — Other Ambulatory Visit: Payer: Self-pay

## 2018-04-15 ENCOUNTER — Other Ambulatory Visit: Payer: Self-pay | Admitting: Family Medicine

## 2018-04-15 ENCOUNTER — Encounter: Payer: Self-pay | Admitting: Family Medicine

## 2018-04-15 ENCOUNTER — Ambulatory Visit (INDEPENDENT_AMBULATORY_CARE_PROVIDER_SITE_OTHER): Payer: Medicare HMO | Admitting: Family Medicine

## 2018-04-15 VITALS — BP 123/73 | HR 71 | Temp 98.2°F | Resp 20 | Ht 72.72 in | Wt 266.8 lb

## 2018-04-15 DIAGNOSIS — M109 Gout, unspecified: Secondary | ICD-10-CM

## 2018-04-15 MED ORDER — COLCHICINE 0.6 MG PO TABS: ORAL_TABLET | ORAL | 1 refills | Status: DC

## 2018-04-15 MED ORDER — ALLOPURINOL 100 MG PO TABS: 100.0000 mg | ORAL_TABLET | Freq: Every day | ORAL | 6 refills | Status: DC

## 2018-04-15 MED ORDER — DICLOFENAC SODIUM 75 MG PO TBEC: 75.0000 mg | DELAYED_RELEASE_TABLET | Freq: Two times a day (BID) | ORAL | 1 refills | Status: DC

## 2018-04-15 NOTE — Telephone Encounter (Signed)
Dr. Linna Darner has already completed this request, today.

## 2018-04-15 NOTE — Progress Notes (Signed)
Patient ID: AAIDEN DEPOY, male    DOB: 02-Mar-1950  Age: 68 y.o. MRN: [id number]  Chief Complaint  Patient presents with  . Foot Pain    X 3 week- right foot -pt states gout    Subjective:   Patient has had another flare of gout in his foot.  He called in here once he got a refill on his colchicine.  After 5 days he still having pain.  He does not think his uric acid has been checked.  Current allergies, medications, problem list, past/family and social histories reviewed.  Objective:  BP 123/73 (BP Location: Left Arm, Patient Position: Sitting, Cuff Size: Large)   Pulse 71   Temp 98.2 F (36.8 C) (Oral)   Resp 20   Ht 6' 0.72" (1.847 m)   Wt 266 lb 12.8 oz (121 kg)   SpO2 98%   BMI 35.47 kg/m   Healthy-appearing man in no acute distress.  He has a swollen MCP joint of the large toe right foot.  It is tender to touch and tender to motion.  No other joints bothering him.  Assessment & Plan:   Assessment: 1. Acute gout involving toe of right foot, unspecified cause   2. Acute gouty arthritis       Plan: This appears to be fairly classic gout.  We will check some lab work and start him on medication.  I think he belongs on allopurinol since he has had recurrences.  Orders Placed This Encounter  Procedures  . Comprehensive metabolic panel  . Uric acid    Meds ordered this encounter  Medications  . colchicine 0.6 MG tablet    Sig: Take 1.2 mg now and 0.6 mg one hour later. Take 0.6 mg daily after that x 5 days.    Dispense:  20 tablet    Refill:  1  . diclofenac (VOLTAREN) 75 MG EC tablet    Sig: Take 1 tablet (75 mg total) by mouth 2 (two) times daily.    Dispense:  30 tablet    Refill:  1  . allopurinol (ZYLOPRIM) 100 MG tablet    Sig: Take 1 tablet (100 mg total) by mouth daily.    Dispense:  30 tablet    Refill:  6         Patient Instructions   Take the colchicine to initially, then 2 twice daily as needed for the gout.    Take diclofenac 75 mg  1 twice daily with food for about 10 days.  Stop if it irritates her stomach.  In 1 week begin taking allopurinol 100 mg 1 daily.  Return to see your primary care in 2 to 3 months and get labs rechecked to decide whether you need to be on more allopurinol or not.  Return sooner if problems.  Other alternatives for medication such as cortisone type medicines if needed.    If you have lab work done today you will be contacted with your lab results within the next 2 weeks.  If you have not heard from [place] then please contact us. The fastest way to get your results is to register for My Chart.   IF you received an x-ray today, you will receive an invoice from [facility]. Please contact [facility] at [phone] with questions or concerns regarding your invoice.   IF you received labwork today, you will receive an invoice from [facility]. Please contact [facility] at [phone] with questions or concerns regarding your invoice.  Our billing staff will not be able to assist you with questions regarding bills from these companies.  You will be contacted with the lab results as soon as they are available. The fastest way to get your results is to activate your My Chart account. Instructions are located on the last page of this paperwork. If you have not heard from [place] regarding the results in 2 weeks, please contact this office.        No follow-ups on file.   Ruben Reason, MD 04/15/2018

## 2018-04-15 NOTE — Patient Instructions (Addendum)
Take the colchicine to initially, then 2 twice daily as needed for the gout.    Take diclofenac 75 mg 1 twice daily with food for about 10 days.  Stop if it irritates her stomach.  In 1 week begin taking allopurinol 100 mg 1 daily.  Return to see your primary care in 2 to 3 months and get labs rechecked to decide whether you need to be on more allopurinol or not.  Return sooner if problems.  Other alternatives for medication such as cortisone type medicines if needed.   Gout  Gout is a condition that causes painful swelling of the joints. Gout is a type of inflammation of the joints (arthritis). This condition is caused by having too much uric acid in the body. Uric acid is a chemical that forms when the body breaks down substances called purines. Purines are important for building body proteins. When the body has too much uric acid, sharp crystals can form and build up inside the joints. This causes pain and swelling. Gout attacks can happen quickly and may be very painful (acute gout). Over time, the attacks can affect more joints and become more frequent (chronic gout). Gout can also cause uric acid to build up under the skin and inside the kidneys. What are the causes? This condition is caused by too much uric acid in your blood. This can happen because:  Your kidneys do not remove enough uric acid from your blood. This is the most common cause.  Your body makes too much uric acid. This can happen with some cancers and cancer treatments. It can also occur if your body is breaking down too many red blood cells (hemolytic anemia).  You eat too many foods that are high in purines. These foods include organ meats and some seafood. Alcohol, especially beer, is also high in purines. A gout attack may be triggered by trauma or stress. What increases the risk? You are more likely to develop this condition if you:  Have a family history of gout.  Are male and middle-aged.  Are male and  have gone through menopause.  Are obese.  Frequently drink alcohol, especially beer.  Are dehydrated.  Lose weight too quickly.  Have an organ transplant.  Have lead poisoning.  Take certain medicines, including aspirin, cyclosporine, diuretics, levodopa, and niacin.  Have kidney disease.  Have a skin condition called psoriasis. What are the signs or symptoms? An attack of acute gout happens quickly. It usually occurs in just one joint. The most common place is the big toe. Attacks often start at night. Other joints that may be affected include joints of the feet, ankle, knee, fingers, wrist, or elbow. Symptoms of this condition may include:  Severe pain.  Warmth.  Swelling.  Stiffness.  Tenderness. The affected joint may be very painful to touch.  Shiny, red, or purple skin.  Chills and fever. Chronic gout may cause symptoms more frequently. More joints may be involved. You may also have white or yellow lumps (tophi) on your hands or feet or in other areas near your joints. How is this diagnosed? This condition is diagnosed based on your symptoms, medical history, and physical exam. You may have tests, such as:  Blood tests to measure uric acid levels.  Removal of joint fluid with a thin needle (aspiration) to look for uric acid crystals.  X-rays to look for joint damage. How is this treated? Treatment for this condition has two phases: treating an acute attack and preventing  future attacks. Acute gout treatment may include medicines to reduce pain and swelling, including:  NSAIDs.  Steroids. These are strong anti-inflammatory medicines that can be taken by mouth (orally) or injected into a joint.  Colchicine. This medicine relieves pain and swelling when it is taken soon after an attack. It can be given by mouth or through an IV. Preventive treatment may include:  Daily use of smaller doses of NSAIDs or colchicine.  Use of a medicine that reduces uric acid  levels in your blood.  Changes to your diet. You may need to see a dietitian about what to eat and drink to prevent gout. Follow these instructions at home: During a gout attack   If directed, put ice on the affected area: ? Put ice in a plastic bag. ? Place a towel between your skin and the bag. ? Leave the ice on for 20 minutes, 2-3 times a day.  Raise (elevate) the affected joint above the level of your heart as often as possible.  Rest the joint as much as possible. If the affected joint is in your leg, you may be given crutches to use.  Follow instructions from your health care provider about eating or drinking restrictions. Avoiding future gout attacks  Follow a low-purine diet as told by your dietitian or health care provider. Avoid foods and drinks that are high in purines, including liver, kidney, anchovies, asparagus, herring, mushrooms, mussels, and beer.  Maintain a healthy weight or lose weight if you are overweight. If you want to lose weight, talk with your health care provider. It is important that you do not lose weight too quickly.  Start or maintain an exercise program as told by your health care provider. Eating and drinking  Drink enough fluids to keep your urine pale yellow.  If you drink alcohol: ? Limit how much you use to:  0-1 drink a day for women.  0-2 drinks a day for men. ? Be aware of how much alcohol is in your drink. In the [place] one drink equals one 12 oz bottle of beer (355 mL) one 5 oz glass of wine (148 mL), or one 1 oz glass of hard liquor (44 mL). General instructions  Take over-the-counter and prescription medicines only as told by your health care provider.  Do not drive or use heavy machinery while taking prescription pain medicine.  Return to your normal activities as told by your health care provider. Ask your health care provider what activities are safe for you.  Keep all follow-up visits as told by your health care provider.  This is important. Contact a health care provider if you have:  Another gout attack.  Continuing symptoms of a gout attack after 10 days of treatment.  Side effects from your medicines.  Chills or a fever.  Burning pain when you urinate.  Pain in your lower back or belly. Get help right away if you:  Have severe or uncontrolled pain.  Cannot urinate. Summary  Gout is painful swelling of the joints caused by inflammation.  The most common site of pain is the big toe, but it can affect other joints in the body.  Medicines and dietary changes can help to prevent and treat gout attacks. This information is not intended to replace advice given to you by your health care provider. Make sure you discuss any questions you have with your health care provider. Document Released: 01/25/2000 Document Revised: 08/19/2017 Document Reviewed: 08/19/2017 Elsevier Interactive Patient Education  2019  [employer].   If you have lab work done today you will be contacted with your lab results within the next 2 weeks.  If you have not heard from [place] then please contact us. The fastest way to get your results is to register for My Chart.   IF you received an x-ray today, you will receive an invoice from [facility]. Please contact [facility] at [phone] with questions or concerns regarding your invoice.   IF you received labwork today, you will receive an invoice from [facility]. Please contact [facility] at [phone] with questions or concerns regarding your invoice.   Our billing staff will not be able to assist you with questions regarding bills from these companies.  You will be contacted with the lab results as soon as they are available. The fastest way to get your results is to activate your My Chart account. Instructions are located on the last page of this paperwork. If you have not heard from [place] regarding the results in 2 weeks, please contact this office.

## 2018-04-15 NOTE — Addendum Note (Signed)
Addended by: Doran Clay on: 04/15/2018 12:36 PM   Modules accepted: Orders

## 2018-04-16 LAB — COMPREHENSIVE METABOLIC PANEL
ALT: 21 IU/L (ref 0–44)
AST: 23 IU/L (ref 0–40)
Albumin/Globulin Ratio: 1.7 (ref 1.2–2.2)
Albumin: 4.5 g/dL (ref 3.8–4.8)
Alkaline Phosphatase: 112 IU/L (ref 39–117)
BUN/Creatinine Ratio: 9 — ABNORMAL LOW (ref 10–24)
BUN: 10 mg/dL (ref 8–27)
Bilirubin Total: 0.6 mg/dL (ref 0.0–1.2)
CO2: 23 mmol/L (ref 20–29)
Calcium: 9.5 mg/dL (ref 8.6–10.2)
Chloride: 100 mmol/L (ref 96–106)
Creatinine, Ser: 1.06 mg/dL (ref 0.76–1.27)
GFR calc Af Amer: 82 mL/min/{1.73_m2} (ref >59)
GFR calc non Af Amer: 71 mL/min/{1.73_m2} (ref >59)
Globulin, Total: 2.7 g/dL (ref 1.5–4.5)
Glucose: 103 mg/dL — ABNORMAL HIGH (ref 65–99)
Potassium: 4.7 mmol/L (ref 3.5–5.2)
Sodium: 140 mmol/L (ref 134–144)
Total Protein: 7.2 g/dL (ref 6.0–8.5)

## 2018-04-16 LAB — URIC ACID: Uric Acid: 7.3 mg/dL (ref 3.7–8.6)

## 2018-04-19 ENCOUNTER — Other Ambulatory Visit: Payer: Self-pay | Admitting: Family Medicine

## 2018-04-19 DIAGNOSIS — F32A Depression, unspecified: Secondary | ICD-10-CM

## 2018-04-19 DIAGNOSIS — I10 Essential (primary) hypertension: Secondary | ICD-10-CM

## 2018-04-19 DIAGNOSIS — F329 Major depressive disorder, single episode, unspecified: Secondary | ICD-10-CM

## 2018-04-26 DIAGNOSIS — N5231 Erectile dysfunction following radical prostatectomy: Secondary | ICD-10-CM | POA: Diagnosis not present

## 2018-05-04 ENCOUNTER — Telehealth: Payer: Self-pay | Admitting: *Deleted

## 2018-05-04 NOTE — Telephone Encounter (Signed)
Left detailed message scheduled an AWV at 12;30 on the same day he is seeing Dr. Carlota Raspberry at 1:20.   Patient will see Almyra Free at 12;30 then Dr. Carlota Raspberry at 1:20.

## 2018-05-09 ENCOUNTER — Other Ambulatory Visit: Payer: Self-pay | Admitting: Family Medicine

## 2018-05-09 DIAGNOSIS — M109 Gout, unspecified: Secondary | ICD-10-CM

## 2018-06-08 ENCOUNTER — Other Ambulatory Visit: Payer: Self-pay | Admitting: Emergency Medicine

## 2018-06-08 DIAGNOSIS — I10 Essential (primary) hypertension: Secondary | ICD-10-CM

## 2018-06-08 DIAGNOSIS — Z13220 Encounter for screening for lipoid disorders: Secondary | ICD-10-CM

## 2018-06-08 DIAGNOSIS — R7303 Prediabetes: Secondary | ICD-10-CM

## 2018-06-14 ENCOUNTER — Other Ambulatory Visit: Payer: Self-pay

## 2018-06-14 ENCOUNTER — Telehealth (INDEPENDENT_AMBULATORY_CARE_PROVIDER_SITE_OTHER): Payer: Medicare HMO | Admitting: Family Medicine

## 2018-06-14 ENCOUNTER — Ambulatory Visit: Payer: Self-pay

## 2018-06-14 NOTE — Progress Notes (Signed)
CC- CPE-

## 2018-06-14 NOTE — Patient Instructions (Signed)
° ° ° °  If you have lab work done today you will be contacted with your lab results within the next 2 weeks.  If you have not heard from us then please contact us. The fastest way to get your results is to register for My Chart. ° ° °IF you received an x-ray today, you will receive an invoice from [facility]. Please contact [facility] at [phone] with questions or concerns regarding your invoice.  ° °IF you received labwork today, you will receive an invoice from [facility]. Please contact [facility] at [phone] with questions or concerns regarding your invoice.  ° °Our billing staff will not be able to assist you with questions regarding bills from these companies. ° °You will be contacted with the lab results as soon as they are available. The fastest way to get your results is to activate your My Chart account. Instructions are located on the last page of this paperwork. If you have not heard from us regarding the results in 2 weeks, please contact this office. °  ° ° ° °

## 2018-06-22 ENCOUNTER — Ambulatory Visit (INDEPENDENT_AMBULATORY_CARE_PROVIDER_SITE_OTHER): Payer: Medicare HMO | Admitting: Family Medicine

## 2018-06-22 ENCOUNTER — Encounter: Payer: Self-pay | Admitting: Family Medicine

## 2018-06-22 ENCOUNTER — Other Ambulatory Visit: Payer: Self-pay

## 2018-06-22 VITALS — BP 117/74 | HR 73 | Temp 97.6°F | Resp 14 | Ht 72.0 in | Wt 264.4 lb

## 2018-06-22 DIAGNOSIS — I10 Essential (primary) hypertension: Secondary | ICD-10-CM | POA: Diagnosis not present

## 2018-06-22 DIAGNOSIS — R5383 Other fatigue: Secondary | ICD-10-CM

## 2018-06-22 DIAGNOSIS — Z13220 Encounter for screening for lipoid disorders: Secondary | ICD-10-CM | POA: Diagnosis not present

## 2018-06-22 DIAGNOSIS — L299 Pruritus, unspecified: Secondary | ICD-10-CM

## 2018-06-22 DIAGNOSIS — K5900 Constipation, unspecified: Secondary | ICD-10-CM | POA: Diagnosis not present

## 2018-06-22 DIAGNOSIS — Z1329 Encounter for screening for other suspected endocrine disorder: Secondary | ICD-10-CM | POA: Diagnosis not present

## 2018-06-22 DIAGNOSIS — R7303 Prediabetes: Secondary | ICD-10-CM | POA: Diagnosis not present

## 2018-06-22 DIAGNOSIS — M109 Gout, unspecified: Secondary | ICD-10-CM

## 2018-06-22 NOTE — Patient Instructions (Addendum)
For constipation - drink plenty of water, and fiber in diet. miralax if needed.  See other info below. Return if persistent issue.   Blood pressure looks okay today, no medication changes at this time.  For the fatigue I will check a thyroid test and electrolytes a blood count but try cutting back to 1 drink per day to see if that helps.  If fatigue is not improving in the next few weeks schedule another visit with me and we can look at other potential causes.   For the gout I do recommend starting allopurinol once per day to see if that lessens the chance of gout flare in the future.  I would like to check a uric acid or gout blood test in the next 2 months once you have been on that medication.  For foot itching - on to use over the counter hydrocortisone cream if needed for itching. Make sure to wear cotton socks and if those become wet, change to dry socks. Return if not helping issue or worsens.    Constipation, Adult Constipation is when a person:  Poops (has a bowel movement) fewer times in a week than normal.  Has a hard time pooping.  Has poop that is dry, hard, or bigger than normal. Follow these instructions at home: Eating and drinking   Eat foods that have a lot of fiber, such as: ? Fresh fruits and vegetables. ? Whole grains. ? Beans.  Eat less of foods that are high in fat, low in fiber, or overly processed, such as: ? [place] fries. ? Hamburgers. ? Cookies. ? Candy. ? Soda.  Drink enough fluid to keep your pee (urine) clear or pale yellow. General instructions  Exercise regularly or as told by your doctor.  Go to the restroom when you feel like you need to poop. Do not hold it in.  Take over-the-counter and prescription medicines only as told by your doctor. These include any fiber supplements.  Do pelvic floor retraining exercises, such as: ? Doing deep breathing while relaxing your lower belly (abdomen). ? Relaxing your pelvic floor while  pooping.  Watch your condition for any changes.  Keep all follow-up visits as told by your doctor. This is important. Contact a doctor if:  You have pain that gets worse.  You have a fever.  You have not pooped for 4 days.  You throw up (vomit).  You are not hungry.  You lose weight.  You are bleeding from the anus.  You have thin, pencil-like poop (stool). Get help right away if:  You have a fever, and your symptoms suddenly get worse.  You leak poop or have blood in your poop.  Your belly feels hard or bigger than normal (is bloated).  You have very bad belly pain.  You feel dizzy or you faint. This information is not intended to replace advice given to you by your health care provider. Make sure you discuss any questions you have with your health care provider. Document Released: 07/16/2007 Document Revised: 08/17/2015 Document Reviewed: 07/18/2015 Elsevier Interactive Patient Education  [employer].  If you have lab work done today you will be contacted with your lab results within the next 2 weeks.  If you have not heard from [place] then please contact us. The fastest way to get your results is to register for My Chart.   IF you received an x-ray today, you will receive an invoice from [facility]. Please contact [facility] at [phone]  with questions or concerns regarding your invoice.   IF you received labwork today, you will receive an invoice from [facility]. Please contact [facility] at [phone] with questions or concerns regarding your invoice.   Our billing staff will not be able to assist you with questions regarding bills from these companies.  You will be contacted with the lab results as soon as they are available. The fastest way to get your results is to activate your My Chart account. Instructions are located on the last page of this paperwork. If you have not heard from [place] regarding the results in 2 weeks, please contact this  office.

## 2018-06-22 NOTE — Progress Notes (Signed)
Subjective:    Patient ID: Jordan Hines, male    DOB: 25-Jun-1950, 68 y.o.   MRN: [id number]  HPI Jordan Hines is a 68 y.o. male Presents today for: Chief Complaint  Patient presents with  . Medicare Wellness    was seen by Maurine Minister for part of medicare wellness on 04/14/18. Here today to finish up and labs 6CIT,Functional status, and advance directive was done by Almyra Free. Patient stated he have been having to take otc med to have a bowl movement   MCR wellness exam performed in March.   Constipation: Past week. Tried otc liquid stool softener, that has helped   Hypertension: BP Readings from Last 3 Encounters:  06/22/18 117/74  04/15/18 123/73  04/14/18 115/77   Lab Results  Component Value Date   CREATININE 1.06 04/15/2018  Lisinopril 10 mg daily, Toprol 50 mg daily.  No new side effects.   Depression: Taking Zoloft 50 mg daily. Doing ok. Mood is doing ok.  Tired at times. Low energy level - fatigue for awhile now. No recent TSH. walking 2 miles per night - no chest pain or dyspnea.  Alcohol: avg of 2 drinks per day.  Lab Results  Component Value Date   WBC 4.1 08/10/2017   HGB 14.0 08/10/2017   HCT 41.9 08/10/2017   MCV 89 08/10/2017   PLT 222 08/10/2017    Depression screen PHQ 2/9 06/22/2018 04/15/2018 04/14/2018 02/26/2018 11/18/2017  Decreased Interest 0 0 0 0 0  Down, Depressed, Hopeless 0 0 0 0 0  PHQ - 2 Score 0 0 0 0 0  Altered sleeping - - - - -  Tired, decreased energy - - - - -  Change in appetite - - - - -  Feeling bad or failure about yourself  - - - - -  Trouble concentrating - - - - -  Moving slowly or fidgety/restless - - - - -  Suicidal thoughts - - - - -  PHQ-9 Score - - - - -  Difficult doing work/chores - - - - -     Prediabetes: Lab Results  Component Value Date   HGBA1C 6.1 (H) 03/24/2017   Wt Readings from Last 3 Encounters:  06/22/18 264 lb 6.4 oz (119.9 kg)  04/15/18 266 lb 12.8 oz (121 kg)  04/14/18 266 lb 9.6 oz (120.9 kg)   walking 2 mi per day.   Gout: Lab Results  Component Value Date   LABURIC 7.3 04/15/2018  Treated for gout flare in March with colchicine, diclofenac, then recommended starting allopurinol after short interval with planned 2 to 35-month follow-up on repeat labs. Not taking allopurinol - was only taking during the time of pain.   Working in yard yesterday, Jordan Hines yard. Itching at ball of foot. Treated with calamine lotion. No rash. R greater than left. Comes and goes, not daily. Lasted about 20 mins. Was wearing work [employer].   Vision: Due for optho eval - plans on scheduling appt.   Visual Acuity Screening   Right eye Left eye Both eyes  Without correction:     With correction: 20/50 20/40 20/40      Patient Active Problem List   Diagnosis Date Noted  . Acute gouty arthritis 02/26/2018  . Non-allergic rhinitis 01/06/2018   Past Medical History:  Diagnosis Date  . Anxiety   . Arthritis   . Cancer ([place])   . Depression   . Hypertension    Past Surgical History:  Procedure Laterality  Date  . PROSTATE SURGERY     No Known Allergies Prior to Admission medications   Medication Sig Start Date End Date Taking? Authorizing Provider  allopurinol (ZYLOPRIM) 100 MG tablet Take 1 tablet (100 mg total) by mouth daily. 04/15/18  Yes [name], MD  colchicine 0.6 MG tablet Take 1.2 mg now and 0.6 mg one hour later. Take 0.6 mg daily after that x 5 days. 04/15/18  Yes [name], MD  diclofenac (VOLTAREN) 75 MG EC tablet Take 1 tablet (75 mg total) by mouth 2 (two) times daily. 04/15/18  Yes [name], MD  fluticasone (FLONASE) 50 MCG/ACT nasal spray Place 1-2 sprays into both nostrils daily. 11/18/17  Yes Rutherford Guys, MD  ipratropium (ATROVENT) 0.06 % nasal spray Place 2 sprays into both nostrils 4 (four) times daily as needed for rhinitis. 11/18/17  Yes Rutherford Guys, MD  lisinopril (PRINIVIL,ZESTRIL) 10 MG tablet TAKE 1 TABLET EVERY DAY 04/20/18  Yes [name], MD   metoprolol succinate (TOPROL-XL) 50 MG 24 hr tablet TAKE 1 TABLET EVERY DAY  WITH  OR  IMMEDIATELY FOLLOWING A MEAL 04/20/18  Yes [name], MD  sertraline (ZOLOFT) 50 MG tablet TAKE 1 TABLET EVERY DAY 04/20/18  Yes [name], MD  traMADol (ULTRAM) 50 MG tablet Take 1 tablet (50 mg total) by mouth every 8 (eight) hours as needed for moderate pain or severe pain. 02/26/18  Yes SagardiaInes Bloomer, MD   Social History   Socioeconomic History  . Marital status: Divorced    Spouse name: Not on file  . Number of children: 1  . Years of education: Not on file  . Highest education level: Some college, no degree  Occupational History  . Not on file  Social Needs  . Financial resource strain: Not hard at all  . Food insecurity:    Worry: Never true    Inability: Never true  . Transportation needs:    Medical: No    Non-medical: No  Tobacco Use  . Smoking status: Current Every Day Smoker    Types: Cigars  . Smokeless tobacco: Never Used  Substance and Sexual Activity  . Alcohol use: Yes    Alcohol/week: 7.0 standard drinks    Types: 7 Cans of beer per week  . Drug use: No  . Sexual activity: Yes  Lifestyle  . Physical activity:    Days per week: 0 days    Minutes per session: 0 min  . Stress: Not at all  Relationships  . Social connections:    Talks on phone: More than three times a week    Gets together: Once a week    Attends religious service: Never    Active member of club or organization: No    Attends meetings of clubs or organizations: Never    Relationship status: Divorced  . Intimate partner violence:    Fear of current or ex partner: No    Emotionally abused: No    Physically abused: No    Forced sexual activity: No  Other Topics Concern  . Not on file  Social History Narrative  . Not on file    Review of Systems  Constitutional: Negative for fatigue and unexpected weight change.  Eyes: Negative for visual disturbance.  Respiratory: Negative  for cough, chest tightness and shortness of breath.   Cardiovascular: Negative for chest pain, palpitations and leg swelling.  Gastrointestinal: Negative for abdominal pain and blood in  stool.  Neurological: Negative for dizziness, light-headedness and headaches.   Other per HPI.     Objective:   Physical Exam Vitals signs reviewed.  Constitutional:      Appearance: He is well-developed.  HENT:     Head: Normocephalic and atraumatic.  Eyes:     Pupils: Pupils are equal, round, and reactive to light.  Neck:     Vascular: No carotid bruit or JVD.  Cardiovascular:     Rate and Rhythm: Normal rate and regular rhythm.     Heart sounds: Normal heart sounds. No murmur.  Pulmonary:     Effort: Pulmonary effort is normal.     Breath sounds: Normal breath sounds. No rales.  Skin:    General: Skin is warm and dry.  Neurological:     Mental Status: He is alert and oriented to person, place, and time.    Vitals:   06/22/18 1023  BP: 117/74  Pulse: 73  Resp: 14  Temp: 97.6 F (36.4 C)  TempSrc: Oral  SpO2: 98%  Weight: 264 lb 6.4 oz (119.9 kg)  Height: 6' (1.829 m)       Assessment & Plan:   ERVEY FALLIN is a 68 y.o. male Fatigue, unspecified type - Plan: CBC, TSH, CANCELED: Basic metabolic panel  -Possible deconditioning versus depression.  Denies worsening depression symptoms.  Will screen for anemia with CBC, check thyroid, increase fluids which may also help with constipation, and recommended cutting back on alcohol to see if that also improve symptoms.  Recheck next few weeks if not improving, sooner if worse.  Prediabetes - Plan: Hemoglobin A1c, CANCELED: Hemoglobin A1c  -Check A1c, diet/exercise discussed  Hypertension, unspecified type - Plan: Comprehensive metabolic panel, CANCELED: Basic metabolic panel  -Stable, continue same regimen.  Labs pending.  Screening for hyperlipidemia - Plan: Lipid Panel  Screening for thyroid disorder - Plan: TSH  Gout,  unspecified cause, unspecified chronicity, unspecified site  -Start allopurinol, recheck labs in the next 2 months.  Anticipate lessening gout flares.  Constipation, unspecified constipation type  -Increase fluids/fiber in diet, handout given, RTC precautions if persistent  Itching  -Could be due to wet feet.  Recommended to continue breathable socks, change to dry socks as needed, quickly change out of wet socks.  Episodic hydrocortisone okay, but RTC precautions if persistent.   No orders of the defined types were placed in this encounter.  Patient Instructions   For constipation - drink plenty of water, and fiber in diet. miralax if needed.  See other info below. Return if persistent issue.   Blood pressure looks okay today, no medication changes at this time.  For the fatigue I will check a thyroid test and electrolytes a blood count but try cutting back to 1 drink per day to see if that helps.  If fatigue is not improving in the next few weeks schedule another visit with me and we can look at other potential causes.   For the gout I do recommend starting allopurinol once per day to see if that lessens the chance of gout flare in the future.  I would like to check a uric acid or gout blood test in the next 2 months once you have been on that medication.  For foot itching - on to use over the counter hydrocortisone cream if needed for itching. Make sure to wear cotton socks and if those become wet, change to dry socks. Return if not helping issue or worsens.  Constipation, Adult Constipation is when a person:  Poops (has a bowel movement) fewer times in a week than normal.  Has a hard time pooping.  Has poop that is dry, hard, or bigger than normal. Follow these instructions at home: Eating and drinking   Eat foods that have a lot of fiber, such as: ? Fresh fruits and vegetables. ? Whole grains. ? Beans.  Eat less of foods that are high in fat, low in fiber, or overly  processed, such as: ? [place] fries. ? Hamburgers. ? Cookies. ? Candy. ? Soda.  Drink enough fluid to keep your pee (urine) clear or pale yellow. General instructions  Exercise regularly or as told by your doctor.  Go to the restroom when you feel like you need to poop. Do not hold it in.  Take over-the-counter and prescription medicines only as told by your doctor. These include any fiber supplements.  Do pelvic floor retraining exercises, such as: ? Doing deep breathing while relaxing your lower belly (abdomen). ? Relaxing your pelvic floor while pooping.  Watch your condition for any changes.  Keep all follow-up visits as told by your doctor. This is important. Contact a doctor if:  You have pain that gets worse.  You have a fever.  You have not pooped for 4 days.  You throw up (vomit).  You are not hungry.  You lose weight.  You are bleeding from the anus.  You have thin, pencil-like poop (stool). Get help right away if:  You have a fever, and your symptoms suddenly get worse.  You leak poop or have blood in your poop.  Your belly feels hard or bigger than normal (is bloated).  You have very bad belly pain.  You feel dizzy or you faint. This information is not intended to replace advice given to you by your health care provider. Make sure you discuss any questions you have with your health care provider. Document Released: 07/16/2007 Document Revised: 08/17/2015 Document Reviewed: 07/18/2015 Elsevier Interactive Patient Education  [employer].  If you have lab work done today you will be contacted with your lab results within the next 2 weeks.  If you have not heard from [place] then please contact us. The fastest way to get your results is to register for My Chart.   IF you received an x-ray today, you will receive an invoice from [facility]. Please contact [facility] at [phone] with questions or concerns regarding your invoice.    IF you received labwork today, you will receive an invoice from [facility]. Please contact [facility] at [phone] with questions or concerns regarding your invoice.   Our billing staff will not be able to assist you with questions regarding bills from these companies.  You will be contacted with the lab results as soon as they are available. The fastest way to get your results is to activate your My Chart account. Instructions are located on the last page of this paperwork. If you have not heard from [place] regarding the results in 2 weeks, please contact this office.       Signed,   Merri Ray, MD [facility].  06/22/18 6:30 PM

## 2018-06-23 LAB — CBC
Hematocrit: 44.5 % (ref 37.5–51.0)
Hemoglobin: 14.6 g/dL (ref 13.0–17.7)
MCH: 29.3 pg (ref 26.6–33.0)
MCHC: 32.8 g/dL (ref 31.5–35.7)
MCV: 89 fL (ref 79–97)
Platelets: 226 10*3/uL (ref 150–450)
RBC: 4.99 x10E6/uL (ref 4.14–5.80)
RDW: 14.1 % (ref 11.6–15.4)
WBC: 4 10*3/uL (ref 3.4–10.8)

## 2018-06-23 LAB — LIPID PANEL
Chol/HDL Ratio: 2.9 ratio (ref 0.0–5.0)
Cholesterol, Total: 171 mg/dL (ref 100–199)
HDL: 58 mg/dL (ref >39)
LDL Calculated: 87 mg/dL (ref 0–99)
Triglycerides: 129 mg/dL (ref 0–149)
VLDL Cholesterol Cal: 26 mg/dL (ref 5–40)

## 2018-06-23 LAB — COMPREHENSIVE METABOLIC PANEL
ALT: 18 IU/L (ref 0–44)
AST: 20 IU/L (ref 0–40)
Albumin/Globulin Ratio: 1.6 (ref 1.2–2.2)
Albumin: 4.2 g/dL (ref 3.8–4.8)
Alkaline Phosphatase: 92 IU/L (ref 39–117)
BUN/Creatinine Ratio: 9 — ABNORMAL LOW (ref 10–24)
BUN: 9 mg/dL (ref 8–27)
Bilirubin Total: 0.6 mg/dL (ref 0.0–1.2)
CO2: 20 mmol/L (ref 20–29)
Calcium: 9.3 mg/dL (ref 8.6–10.2)
Chloride: 102 mmol/L (ref 96–106)
Creatinine, Ser: 1.05 mg/dL (ref 0.76–1.27)
GFR calc Af Amer: 83 mL/min/{1.73_m2} (ref >59)
GFR calc non Af Amer: 72 mL/min/{1.73_m2} (ref >59)
Globulin, Total: 2.7 g/dL (ref 1.5–4.5)
Glucose: 101 mg/dL — ABNORMAL HIGH (ref 65–99)
Potassium: 4.6 mmol/L (ref 3.5–5.2)
Sodium: 139 mmol/L (ref 134–144)
Total Protein: 6.9 g/dL (ref 6.0–8.5)

## 2018-06-23 LAB — HEMOGLOBIN A1C
Est. average glucose Bld gHb Est-mCnc: 120 mg/dL
Hgb A1c MFr Bld: 5.8 % — ABNORMAL HIGH (ref 4.8–5.6)

## 2018-06-23 LAB — TSH: TSH: 2.3 u[IU]/mL (ref 0.450–4.500)

## 2018-07-16 DIAGNOSIS — H524 Presbyopia: Secondary | ICD-10-CM | POA: Diagnosis not present

## 2018-08-06 DIAGNOSIS — J342 Deviated nasal septum: Secondary | ICD-10-CM | POA: Diagnosis not present

## 2018-08-06 DIAGNOSIS — J310 Chronic rhinitis: Secondary | ICD-10-CM | POA: Diagnosis not present

## 2018-08-06 DIAGNOSIS — J343 Hypertrophy of nasal turbinates: Secondary | ICD-10-CM | POA: Diagnosis not present

## 2018-08-09 ENCOUNTER — Other Ambulatory Visit: Payer: Self-pay | Admitting: Family Medicine

## 2018-08-09 DIAGNOSIS — F329 Major depressive disorder, single episode, unspecified: Secondary | ICD-10-CM

## 2018-08-09 DIAGNOSIS — I10 Essential (primary) hypertension: Secondary | ICD-10-CM

## 2018-08-09 DIAGNOSIS — F32A Depression, unspecified: Secondary | ICD-10-CM

## 2018-08-16 ENCOUNTER — Encounter: Payer: Self-pay | Admitting: Allergy

## 2018-08-16 NOTE — Progress Notes (Signed)
Reviewed notes from ENT visit Dr. Benjamine Mola 08/06/2018. Found turbinate hypertrophy and nasal septal deviation - recommended surgery.

## 2018-08-20 ENCOUNTER — Other Ambulatory Visit: Payer: Self-pay

## 2018-08-20 ENCOUNTER — Ambulatory Visit (INDEPENDENT_AMBULATORY_CARE_PROVIDER_SITE_OTHER): Payer: Medicare HMO | Admitting: Family Medicine

## 2018-08-20 ENCOUNTER — Ambulatory Visit (INDEPENDENT_AMBULATORY_CARE_PROVIDER_SITE_OTHER): Payer: Medicare HMO

## 2018-08-20 VITALS — BP 121/76 | HR 73 | Temp 97.4°F | Resp 18 | Ht 72.0 in | Wt 265.4 lb

## 2018-08-20 DIAGNOSIS — F329 Major depressive disorder, single episode, unspecified: Secondary | ICD-10-CM

## 2018-08-20 DIAGNOSIS — M79671 Pain in right foot: Secondary | ICD-10-CM

## 2018-08-20 DIAGNOSIS — R0609 Other forms of dyspnea: Secondary | ICD-10-CM

## 2018-08-20 DIAGNOSIS — M109 Gout, unspecified: Secondary | ICD-10-CM | POA: Diagnosis not present

## 2018-08-20 DIAGNOSIS — M10371 Gout due to renal impairment, right ankle and foot: Secondary | ICD-10-CM | POA: Diagnosis not present

## 2018-08-20 DIAGNOSIS — F32A Depression, unspecified: Secondary | ICD-10-CM

## 2018-08-20 DIAGNOSIS — R5383 Other fatigue: Secondary | ICD-10-CM

## 2018-08-20 DIAGNOSIS — M19071 Primary osteoarthritis, right ankle and foot: Secondary | ICD-10-CM | POA: Diagnosis not present

## 2018-08-20 MED ORDER — SERTRALINE HCL 100 MG PO TABS: 100.0000 mg | ORAL_TABLET | Freq: Every day | ORAL | 3 refills | Status: DC

## 2018-08-20 MED ORDER — METOPROLOL SUCCINATE ER 25 MG PO TB24: 25.0000 mg | ORAL_TABLET | Freq: Every day | ORAL | 1 refills | Status: DC

## 2018-08-20 NOTE — Progress Notes (Signed)
Subjective:    Patient ID: Jordan Hines, male    DOB: May 25, 1950, 69 y.o.   MRN: [id number]  HPI Jordan Hines is a 68 y.o. male Presents today for: Chief Complaint  Patient presents with  . Gout    2 mnth f/u, soreness in R toe  . Fatigue    2 mnth f/u, the same   Gout: Lab Results  Component Value Date   LABURIC 7.3 04/15/2018  allopurinol 153m qd started at 5/12 visit. Plan for repeat labs today.  Taking allopurinol once per day.  Has had some persistent soreness in R MTP, but no increased redness/soreness or swelling with starting meds. Prior gout flares in same area.   Fatigue: Discussed at May visit.  Recommended decreasing alcohol use at that time.  Increase fluids as suspected insufficient fluid intake with constipation at that time.  CBC, TSH reassuring.  He is on Zoloft 538mfor depression.  Prediabetes with A1c of 5.8, improved from previous year, and in office glucose was only 101 in May. Still some fatigue. Decreased pep/energy.  No chest pain.  DOE at times - moderate distance. Past 4-5 months. No fever, rare cough.  Smokes cigar once per day.  Planned turbinate reduction in September for nasal congestion. [place]. No known history of heart disease.  Alcohol: 3 shots of liquor per week, 3 beers per week.  Does not feel depressed, less motivation.  48 ounces water per day - more since last visit.  Less constipation.   [facility] 06/22/2018 04/15/2018 04/14/2018 02/26/2018 11/18/2017  Decreased Interest 0 0 0 0 0  Down, Depressed, Hopeless 0 0 0 0 0  PHQ - 2 Score 0 0 0 0 0  Altered sleeping - - - - -  Tired, decreased energy - - - - -  Change in appetite - - - - -  Feeling bad or failure about yourself  - - - - -  Trouble concentrating - - - - -  Moving slowly or fidgety/restless - - - - -  Suicidal thoughts - - - - -  PHQ-9 Score - - - - -  Difficult doing work/chores - - - - -     Lab Results  Component Value Date   WBC 4.0 06/22/2018   HGB 14.6 06/22/2018   HCT 44.5 06/22/2018   MCV 89 06/22/2018   PLT 226 06/22/2018   Lab Results  Component Value Date   TSH 2.300 06/22/2018      Patient Active Problem List   Diagnosis Date Noted  . Acute gouty arthritis 02/26/2018  . Non-allergic rhinitis 01/06/2018   Past Medical History:  Diagnosis Date  . Anxiety   . Arthritis   . [facility]  . Depression   . Hypertension    Past Surgical History:  Procedure Laterality Date  . PROSTATE SURGERY     No Known Allergies Prior to Admission medications   Medication Sig Start Date End Date Taking? Authorizing Provider  allopurinol (ZYLOPRIM) 100 MG tablet Take 1 tablet (100 mg total) by mouth daily. 04/15/18  Yes HoPosey BoyerMD  fluticasone (FLONASE) 50 MCG/ACT nasal spray Place 1-2 sprays into both nostrils daily. 11/18/17  Yes SaRutherford GuysMD  ipratropium (ATROVENT) 0.06 % nasal spray Place 2 sprays into both nostrils 4 (four) times daily as needed for rhinitis. 11/18/17  Yes SaRutherford GuysMD  lisinopril (ZESTRIL) 10 MG tablet TAKE 1 TABLET EVERY DAY.  KEEP UPCOMING APPT 08/10/18  Yes [name], MD  metoprolol succinate (TOPROL-XL) 50 MG 24 hr tablet TAKE 1 TABLET EVERY DAY  WITH  OR  IMMEDIATELY FOLLOWING A MEAL  08/10/18  Yes [name], MD  sertraline (ZOLOFT) 50 MG tablet Take 1 tablet (50 mg total) by mouth daily. KEEP UPCOMING APPT 08/10/18  Yes [name], MD  colchicine 0.6 MG tablet Take 1.2 mg now and 0.6 mg one hour later. Take 0.6 mg daily after that x 5 days. Patient not taking: Reported on 08/20/2018 04/15/18   [name], MD  diclofenac (VOLTAREN) 75 MG EC tablet Take 1 tablet (75 mg total) by mouth 2 (two) times daily. Patient not taking: Reported on 08/20/2018 04/15/18   [name], MD  traMADol (ULTRAM) 50 MG tablet Take 1 tablet (50 mg total) by mouth every 8 (eight) hours as needed for moderate pain or severe pain. Patient not taking: Reported on  08/20/2018 02/26/18   Horald Pollen, MD   Social History   Socioeconomic History  . Marital status: Divorced    Spouse name: Not on file  . Number of children: 1  . Years of education: Not on file  . Highest education level: Some college, no degree  Occupational History  . Not on file  Social Needs  . Financial resource strain: Not hard at all  . Food insecurity    Worry: Never true    Inability: Never true  . Transportation needs    Medical: No    Non-medical: No  Tobacco Use  . Smoking status: Current Every Day Smoker    Types: Cigars  . Smokeless tobacco: Never Used  Substance and Sexual Activity  . Alcohol use: Yes    Alcohol/week: 7.0 standard drinks    Types: 7 Cans of beer per week  . Drug use: No  . Sexual activity: Yes  Lifestyle  . Physical activity    Days per week: 0 days    Minutes per session: 0 min  . Stress: Not at all  Relationships  . Social connections    Talks on phone: More than three times a week    Gets together: Once a week    Attends religious service: Never    Active member of club or organization: No    Attends meetings of clubs or organizations: Never    Relationship status: Divorced  . Intimate partner violence    Fear of current or ex partner: No    Emotionally abused: No    Physically abused: No    Forced sexual activity: No  Other Topics Concern  . Not on file  Social History Narrative  . Not on file    Review of Systems Per HPI    Objective:   Physical Exam Vitals signs reviewed.  Constitutional:      Appearance: He is well-developed.  HENT:     Head: Normocephalic and atraumatic.  Eyes:     Pupils: Pupils are equal, round, and reactive to light.  Neck:     Vascular: No carotid bruit or JVD.     Comments: No JVD.  Cardiovascular:     Rate and Rhythm: Normal rate and regular rhythm.     Heart sounds: Normal heart sounds. No murmur.  Pulmonary:     Effort: Pulmonary effort is normal.     Breath sounds:  Normal breath sounds. No rales.  Musculoskeletal:     Right lower leg: Edema (1+  lower tibial bilaterally. ) present.     Left lower leg: Edema present.  Skin:    General: Skin is warm and dry.  Neurological:     Mental Status: He is alert and oriented to person, place, and time.  Psychiatric:        Mood and Affect: Mood normal.        Thought Content: Thought content normal.      Vitals:   08/20/18 1100  BP: 121/76  Pulse: 73  Resp: 18  Temp: (!) 97.4 F (36.3 C)  SpO2: 98%  Weight: 265 lb 6.4 oz (120.4 kg)  Height: 6' (1.829 m)    EKG: Atrial bradycardia with rate of 58, no acute findings otherwise.  Dg Chest 2 View  Result Date: 08/20/2018 CLINICAL DATA:  Dyspnea on exertion. EXAM: CHEST - 2 VIEW COMPARISON:  Radiographs of August 10, 2017. FINDINGS: The heart size and mediastinal contours are within normal limits. Both lungs are clear. No pneumothorax or pleural effusion is noted. The visualized skeletal structures are unremarkable. IMPRESSION: No active cardiopulmonary disease. Electronically Signed   By: Marijo Conception M.D.   On: 08/20/2018 12:30   Dg Foot Complete Right  Result Date: 08/20/2018 CLINICAL DATA:  Gout of right foot. EXAM: RIGHT FOOT COMPLETE - 3+ VIEW COMPARISON:  None. FINDINGS: There is no evidence of fracture or dislocation. Mild narrowing of the first metatarsophalangeal joint is noted. No definite calcific tophi are noted. Mild posterior calcaneal spurring is noted. No definite erosion is noted. Soft tissues are unremarkable. IMPRESSION: Mild degenerative changes seen involving the first metatarsophalangeal joint. This could be related to gout, but no definite calcific tophi are noted. No other abnormality seen in the right foot. Electronically Signed   By: Marijo Conception M.D.   On: 08/20/2018 12:28       Assessment & Plan:    Jordan Hines is a 68 y.o. male Gout of right foot, unspecified cause, unspecified chronicity - Plan: DG Foot Complete  Right, Uric Acid, Basic metabolic panel Right foot pain - Plan: DG Foot Complete Right, Uric Acid  -Check uric acid.  No apparent acute flare.  Some degenerative changes likely at the MTP.  Minimize gout flares with use of allopurinol.  Fatigue, unspecified type - Plan: Basic metabolic panel, Ambulatory referral to Cardiology, DG Chest 2 View DOE (dyspnea on exertion) - Plan: Pro b natriuretic peptide, EKG 12-Lead, Ambulatory referral to Cardiology, DG Chest 2 View  -Fatigue may be related to bradycardia.  Still recommended increased hydration/maintenance of hydration.  May have depression component as well.  -Start with lower dose of Toprol-XL at 25 mg daily, monitor blood pressure for significant elevation.  -Refer to cardiology to determine other testing  -RTC/ER precautions if acute worsening  Depression, unspecified depression type - Plan: DISCONTINUED: sertraline (ZOLOFT) 100 MG tablet  -Some anhedonia, but fatigue may be contributing.  Will initially try lower dose of Toprol, remain at 50 mg of Zoloft.  (Initial change of 100 mg was canceled.  If persistent anhedonia/fatigue, consider increased dose at that time)  Recheck 4 weeks, ER precautions given in the meantime  Meds ordered this encounter  Medications  . DISCONTD: sertraline (ZOLOFT) 100 MG tablet    Sig: Take 1 tablet (100 mg total) by mouth daily.    Dispense:  30 tablet    Refill:  3  . metoprolol succinate (TOPROL-XL) 25 MG 24 hr tablet    Sig: Take 1 tablet (25 mg  total) by mouth daily.    Dispense:  90 tablet    Refill:  1   Patient Instructions     There are multiple possible causes for fatigue.  Make sure to drink plenty of fluids.  Try lower dose of Toprol as lower heart rate may be contributing.  I will refer you to cardiology.    Continue same dose of Zoloft for now, but if symptoms or not improving with change in blood pressure medication, we have the option of going up on that dose.  If any chest pains,  or worsening shortness of breath, be seen in the emergency room.  You have some arthritis in foot.  Continue allopurinol for gout prevention at this time.   Return to the clinic or go to the nearest emergency room if any of your symptoms worsen or new symptoms occur.  Recheck in 4 weeks.    Fatigue If you have fatigue, you feel tired all the time and have a lack of energy or a lack of motivation. Fatigue may make it difficult to start or complete tasks because of exhaustion. In general, occasional or mild fatigue is often a normal response to activity or life. However, long-lasting (chronic) or extreme fatigue may be a symptom of a medical condition. Follow these instructions at home: General instructions  Watch your fatigue for any changes.  Go to bed and get up at the same time every day.  Avoid fatigue by pacing yourself during the day and getting enough sleep at night.  Maintain a healthy weight. Medicines  Take over-the-counter and prescription medicines only as told by your health care provider.  Take a multivitamin, if told by your health care provider.  Do not use herbal or dietary supplements unless they are approved by your health care provider. Activity   Exercise regularly, as told by your health care provider.  Use or practice techniques to help you relax, such as yoga, tai chi, meditation, or massage therapy. Eating and drinking   Avoid heavy meals in the evening.  Eat a well-balanced diet, which includes lean proteins, whole grains, plenty of fruits and vegetables, and low-fat dairy products.  Avoid consuming too much caffeine.  Avoid the use of alcohol.  Drink enough fluid to keep your urine pale yellow. Lifestyle  Change situations that cause you stress. Try to keep your work and personal schedule in balance.  Do not use any products that contain nicotine or tobacco, such as cigarettes and e-cigarettes. If you need help quitting, ask your health  care provider.  Do not use drugs. Contact a health care provider if:  Your fatigue does not get better.  You have a fever.  You suddenly lose or gain weight.  You have headaches.  You have trouble falling asleep or sleeping through the night.  You feel angry, guilty, anxious, or sad.  You are unable to have a bowel movement (constipation).  Your skin is dry.  You have swelling in your legs or another part of your body. Get help right away if:  You feel confused.  Your vision is blurry.  You feel faint or you pass out.  You have a severe headache.  You have severe pain in your abdomen, your back, or the area between your waist and hips (pelvis).  You have chest pain, shortness of breath, or an irregular or fast heartbeat.  You are unable to urinate, or you urinate less than normal.  You have abnormal bleeding, such as bleeding  from the rectum, vagina, nose, lungs, or nipples.  You vomit blood.  You have thoughts about hurting yourself or others. If you ever feel like you may hurt yourself or others, or have thoughts about taking your own life, get help right away. You can go to your nearest emergency department or call:  Your local emergency services (911 in the [place]  A suicide crisis helpline, such as the [facility] at 718-055-4942. This is open 24 hours a day. Summary  If you have fatigue, you feel tired all the time and have a lack of energy or a lack of motivation.  Fatigue may make it difficult to start or complete tasks because of exhaustion.  Long-lasting (chronic) or extreme fatigue may be a symptom of a medical condition.  Exercise regularly, as told by your health care provider.  Change situations that cause you stress. Try to keep your work and personal schedule in balance. This information is not intended to replace advice given to you by your health care provider. Make sure you discuss any questions you have with  your health care provider. Document Released: 11/24/2006 Document Revised: 05/20/2018 Document Reviewed: 10/22/2016 Elsevier Patient Education  [employer].     If you have lab work done today you will be contacted with your lab results within the next 2 weeks.  If you have not heard from [place] then please contact us. The fastest way to get your results is to register for My Chart.   IF you received an x-ray today, you will receive an invoice from [facility]. Please contact [facility] at [phone] with questions or concerns regarding your invoice.   IF you received labwork today, you will receive an invoice from [facility]. Please contact [facility] at [phone] with questions or concerns regarding your invoice.   Our billing staff will not be able to assist you with questions regarding bills from these companies.  You will be contacted with the lab results as soon as they are available. The fastest way to get your results is to activate your My Chart account. Instructions are located on the last page of this paperwork. If you have not heard from [place] regarding the results in 2 weeks, please contact this office.       Signed,   Merri Ray, MD [facility] [place].  08/21/18 10:04 AM

## 2018-08-20 NOTE — Patient Instructions (Addendum)
There are multiple possible causes for fatigue.  Make sure to drink plenty of fluids.  Try lower dose of Toprol as lower heart rate may be contributing.  I will refer you to cardiology.    Continue same dose of Zoloft for now, but if symptoms or not improving with change in blood pressure medication, we have the option of going up on that dose.  If any chest pains, or worsening shortness of breath, be seen in the emergency room.  You have some arthritis in foot.  Continue allopurinol for gout prevention at this time.   Return to the clinic or go to the nearest emergency room if any of your symptoms worsen or new symptoms occur.  Recheck in 4 weeks.    Fatigue If you have fatigue, you feel tired all the time and have a lack of energy or a lack of motivation. Fatigue may make it difficult to start or complete tasks because of exhaustion. In general, occasional or mild fatigue is often a normal response to activity or life. However, long-lasting (chronic) or extreme fatigue may be a symptom of a medical condition. Follow these instructions at home: General instructions  Watch your fatigue for any changes.  Go to bed and get up at the same time every day.  Avoid fatigue by pacing yourself during the day and getting enough sleep at night.  Maintain a healthy weight. Medicines  Take over-the-counter and prescription medicines only as told by your health care provider.  Take a multivitamin, if told by your health care provider.  Do not use herbal or dietary supplements unless they are approved by your health care provider. Activity   Exercise regularly, as told by your health care provider.  Use or practice techniques to help you relax, such as yoga, tai chi, meditation, or massage therapy. Eating and drinking   Avoid heavy meals in the evening.  Eat a well-balanced diet, which includes lean proteins, whole grains, plenty of fruits and vegetables, and low-fat dairy  products.  Avoid consuming too much caffeine.  Avoid the use of alcohol.  Drink enough fluid to keep your urine pale yellow. Lifestyle  Change situations that cause you stress. Try to keep your work and personal schedule in balance.  Do not use any products that contain nicotine or tobacco, such as cigarettes and e-cigarettes. If you need help quitting, ask your health care provider.  Do not use drugs. Contact a health care provider if:  Your fatigue does not get better.  You have a fever.  You suddenly lose or gain weight.  You have headaches.  You have trouble falling asleep or sleeping through the night.  You feel angry, guilty, anxious, or sad.  You are unable to have a bowel movement (constipation).  Your skin is dry.  You have swelling in your legs or another part of your body. Get help right away if:  You feel confused.  Your vision is blurry.  You feel faint or you pass out.  You have a severe headache.  You have severe pain in your abdomen, your back, or the area between your waist and hips (pelvis).  You have chest pain, shortness of breath, or an irregular or fast heartbeat.  You are unable to urinate, or you urinate less than normal.  You have abnormal bleeding, such as bleeding from the rectum, vagina, nose, lungs, or nipples.  You vomit blood.  You have thoughts about hurting yourself or others. If you ever feel  like you may hurt yourself or others, or have thoughts about taking your own life, get help right away. You can go to your nearest emergency department or call:  Your local emergency services (911 in the [place]  A suicide crisis helpline, such as the [facility] at (512) 712-8735. This is open 24 hours a day. Summary  If you have fatigue, you feel tired all the time and have a lack of energy or a lack of motivation.  Fatigue may make it difficult to start or complete tasks because of  exhaustion.  Long-lasting (chronic) or extreme fatigue may be a symptom of a medical condition.  Exercise regularly, as told by your health care provider.  Change situations that cause you stress. Try to keep your work and personal schedule in balance. This information is not intended to replace advice given to you by your health care provider. Make sure you discuss any questions you have with your health care provider. Document Released: 11/24/2006 Document Revised: 05/20/2018 Document Reviewed: 10/22/2016 Elsevier Patient Education  [employer].     If you have lab work done today you will be contacted with your lab results within the next 2 weeks.  If you have not heard from [place] then please contact us. The fastest way to get your results is to register for My Chart.   IF you received an x-ray today, you will receive an invoice from [facility]. Please contact [facility] at [phone] with questions or concerns regarding your invoice.   IF you received labwork today, you will receive an invoice from [facility]. Please contact [facility] at [phone] with questions or concerns regarding your invoice.   Our billing staff will not be able to assist you with questions regarding bills from these companies.  You will be contacted with the lab results as soon as they are available. The fastest way to get your results is to activate your My Chart account. Instructions are located on the last page of this paperwork. If you have not heard from [place] regarding the results in 2 weeks, please contact this office.

## 2018-08-21 ENCOUNTER — Encounter: Payer: Self-pay | Admitting: Family Medicine

## 2018-08-21 LAB — BASIC METABOLIC PANEL
BUN/Creatinine Ratio: 13 (ref 10–24)
BUN: 13 mg/dL (ref 8–27)
CO2: 23 mmol/L (ref 20–29)
Calcium: 9.3 mg/dL (ref 8.6–10.2)
Chloride: 100 mmol/L (ref 96–106)
Creatinine, Ser: 0.98 mg/dL (ref 0.76–1.27)
GFR calc Af Amer: 91 mL/min/{1.73_m2} (ref >59)
GFR calc non Af Amer: 78 mL/min/{1.73_m2} (ref >59)
Glucose: 88 mg/dL (ref 65–99)
Potassium: 4.8 mmol/L (ref 3.5–5.2)
Sodium: 140 mmol/L (ref 134–144)

## 2018-08-21 LAB — PRO B NATRIURETIC PEPTIDE: NT-Pro BNP: 57 pg/mL (ref 0–376)

## 2018-08-21 LAB — URIC ACID: Uric Acid: 5.8 mg/dL (ref 3.7–8.6)

## 2018-09-20 ENCOUNTER — Other Ambulatory Visit: Payer: Self-pay

## 2018-09-20 ENCOUNTER — Ambulatory Visit: Payer: Medicare HMO | Admitting: Family Medicine

## 2018-09-20 ENCOUNTER — Telehealth: Payer: Self-pay | Admitting: Family Medicine

## 2018-09-20 NOTE — Telephone Encounter (Signed)
Called patient as he had to leave and was not able to be seen today.  Did want to check and see how he was feeling since his last visit when we made some alterations in his blood pressure medicine.  No answer, left voicemail that I will try to reach him again.

## 2018-09-21 ENCOUNTER — Other Ambulatory Visit: Payer: Self-pay | Admitting: Otolaryngology

## 2018-09-21 ENCOUNTER — Telehealth: Payer: Self-pay | Admitting: Family Medicine

## 2018-09-21 NOTE — Telephone Encounter (Signed)
See prior call. I tried to reach again today to check status of changes from last visit.  Left VM to call us back with status, or can send [employer].

## 2018-10-11 ENCOUNTER — Telehealth: Payer: Self-pay | Admitting: Family Medicine

## 2018-10-11 ENCOUNTER — Encounter: Payer: Self-pay | Admitting: Family Medicine

## 2018-10-11 NOTE — Progress Notes (Signed)
Cardiology Office Note   Date:  10/12/2018   ID:  Jordan Hines, DOB [birth year], MRN [id number]  PCP:  Jordan Agreste, MD    No chief complaint on file.  DOE/fatigue  Wt Readings from Last 3 Encounters:  10/12/18 277 lb 12.8 oz (126 kg)  08/20/18 265 lb 6.4 oz (120.4 kg)  06/22/18 264 lb 6.4 oz (119.9 kg)       History of Present Illness: Jordan Hines is a 68 y.o. male who is being seen today for the evaluation of DOE/fatigue at the request of Jordan Agreste, MD.  He has had some fatigue over the past 8-9 months per his report.  He saw Dr. Nyoka Cowden who referred him to cardiology.  He is having a sinus operation coming up.  He feels a shortness of breath when he breaths through his nose.  When he mouth breaths, it is not that bad.   He has not been walking regularly of late. He walks a total of two miles a day.  Denies : Chest pain. Dizziness. Leg edema. Nitroglycerin use. Orthopnea. Palpitations. Paroxysmal nocturnal dyspnea. Syncope.   No recent stress test or other cardiac eval.  He has gained weight, 40-50 Lbs over the past 9 years.  Drinks energy drinks daily and alcohol as well.   No problems walking up stairs in his house.       Past Medical History:  Diagnosis Date  . Anxiety   . Arthritis   . Cancer ([place])   . Depression   . Hypertension     Past Surgical History:  Procedure Laterality Date  . PROSTATE SURGERY       Current Outpatient Medications  Medication Sig Dispense Refill  . allopurinol (ZYLOPRIM) 100 MG tablet Take 1 tablet (100 mg total) by mouth daily. 30 tablet 6  . colchicine 0.6 MG tablet Take 1.2 mg now and 0.6 mg one hour later. Take 0.6 mg daily after that x 5 days. 20 tablet 1  . fluticasone (FLONASE) 50 MCG/ACT nasal spray Place 1-2 sprays into both nostrils daily. 16 g 6  . ipratropium (ATROVENT) 0.06 % nasal spray Place 2 sprays into both nostrils 4 (four) times daily as needed for rhinitis. 15 mL 5  . lisinopril  (ZESTRIL) 10 MG tablet TAKE 1 TABLET EVERY DAY. KEEP UPCOMING APPT 90 tablet 0  . metoprolol succinate (TOPROL-XL) 25 MG 24 hr tablet Take 1 tablet (25 mg total) by mouth daily. 90 tablet 1  . sildenafil (VIAGRA) 100 MG tablet Take 100 mg by mouth daily.     No current facility-administered medications for this visit.     Allergies:   Patient has no known allergies.    Social History:  The patient  reports that he has been smoking cigars. He has never used smokeless tobacco. He reports current alcohol use of about 7.0 standard drinks of alcohol per week. He reports that he does not use drugs.   Family History:  The patient's family history includes Asthma in his mother; Cancer in his father; Diabetes in his father; Heart disease in his mother.  Mother had CHF.   ROS:  Please see the history of present illness.   Otherwise, review of systems are positive for continuing to moke cigars.   All other systems are reviewed and negative.    PHYSICAL EXAM: VS:  BP 112/68   Pulse 83   Ht 6' (1.829 m)   Wt 277 lb 12.8  oz (126 kg)   SpO2 96%   BMI 37.68 kg/m  , BMI Body mass index is 37.68 kg/m. GEN: Well nourished, well developed, in no acute distress  HEENT: normal  Neck: no JVD, carotid bruits, or masses Cardiac: RRR; no murmurs, rubs, or gallops,no edema  Respiratory:  clear to auscultation bilaterally, normal work of breathing GI: soft, nontender, nondistended, + BS, obese MS: no deformity or atrophy  Skin: warm and dry, no rash Neuro:  Strength and sensation are intact Psych: euthymic mood, full affect   EKG:   The ekg ordered 08/20/2018 demonstrates normal ECG   Recent Labs: 06/22/2018: ALT 18; Hemoglobin 14.6; Platelets 226; TSH 2.300 08/20/2018: BUN 13; Creatinine, Ser 0.98; NT-Pro BNP 57; Potassium 4.8; Sodium 140   Lipid Panel    Component Value Date/Time   CHOL 171 06/22/2018 1038   TRIG 129 06/22/2018 1038   HDL 58 06/22/2018 1038   CHOLHDL 2.9 06/22/2018 1038    CHOLHDL 2.7 03/12/2015 1559   VLDL 27 03/12/2015 1559   LDLCALC 87 06/22/2018 1038     Other studies Reviewed: Additional studies/ records that were reviewed today with results demonstrating:  ECG and labs reviewed.   ASSESSMENT AND PLAN:  1. DOE/fatigue: Normal cardiac exam and ECG.  I think he is deconditioned.  He does not have cardiac sx while walking up stairs, 1 flight.  Some of his sx are likely from deconditioning.  2. HTN: The current medical regimen is effective;  continue present plan and medications. 3. Morbid Obesity: Increase exercise to target below.  He needs to cut out fast food and alcohol.  He lives alone so this has limited his cooking at home.  Avoid fast foods.  Try to lose 10 lbs in 12 weeks.  4. No further cardiac testing needed before sinus surgery.      Current medicines are reviewed at length with the patient today.  The patient concerns regarding his medicines were addressed.  The following changes have been made:  No change  Labs/ tests ordered today include:  No orders of the defined types were placed in this encounter.   Recommend 150 minutes/week of aerobic exercise Low fat, low carb, high fiber diet recommended  Disposition:   FU in 4 months   Signed, Larae Grooms, MD  10/12/2018 1:48 PM    [facility] [address], [place], [place]  [address] Phone: [phone]; Fax: [phone]

## 2018-10-11 NOTE — Progress Notes (Signed)
Pt must follow up with cardiology before he can have his surgery. I will call Teoh's office now to relay this message. The pt himself is aware.

## 2018-10-11 NOTE — Telephone Encounter (Signed)
Copied from [place] [phone]. Topic: General - Other >> Oct 11, 2018  1:11 PM [name] wrote: Reason for CRM:  Patient stated he had a referral for a cardiologist but no one has called him about it.  Please return call. (An CRM is being sent because this patient was transferred to [facility] about 3 or 4 times and no one picked up.

## 2018-10-12 ENCOUNTER — Encounter: Payer: Self-pay | Admitting: Interventional Cardiology

## 2018-10-12 ENCOUNTER — Other Ambulatory Visit: Payer: Self-pay

## 2018-10-12 ENCOUNTER — Encounter (HOSPITAL_BASED_OUTPATIENT_CLINIC_OR_DEPARTMENT_OTHER): Payer: Self-pay | Admitting: *Deleted

## 2018-10-12 ENCOUNTER — Ambulatory Visit (INDEPENDENT_AMBULATORY_CARE_PROVIDER_SITE_OTHER): Payer: Medicare HMO | Admitting: Interventional Cardiology

## 2018-10-12 VITALS — BP 112/68 | HR 83 | Ht 72.0 in | Wt 277.8 lb

## 2018-10-12 DIAGNOSIS — R5383 Other fatigue: Secondary | ICD-10-CM

## 2018-10-12 DIAGNOSIS — R0609 Other forms of dyspnea: Secondary | ICD-10-CM

## 2018-10-12 DIAGNOSIS — I10 Essential (primary) hypertension: Secondary | ICD-10-CM

## 2018-10-12 NOTE — Patient Instructions (Signed)
Medication Instructions:  Your physician recommends that you continue on your current medications as directed. Please refer to the Current Medication list given to you today.  If you need a refill on your cardiac medications before your next appointment, please call your pharmacy.   Lab work: None Ordered  If you have labs (blood work) drawn today and your tests are completely normal, you will receive your results only by: Marland Kitchen MyChart Message (if you have MyChart) OR . A paper copy in the mail If you have any lab test that is abnormal or we need to change your treatment, we will call you to review the results.  Testing/Procedures: None ordered  Follow-Up: . Follow up with Dr. Irish Lack in 3-4 months virtually  Any Other Special Instructions Will Be Listed Below (If Applicable).  Weigh yourself daily  Limit the amount of salt and sugar in your diet  Try to lose 10 lbs in 3 months   Low-Sodium Eating Plan Sodium, which is an element that makes up salt, helps you maintain a healthy balance of fluids in your body. Too much sodium can increase your blood pressure and cause fluid and waste to be held in your body. Your health care provider or dietitian may recommend following this plan if you have high blood pressure (hypertension), kidney disease, liver disease, or heart failure. Eating less sodium can help lower your blood pressure, reduce swelling, and protect your heart, liver, and kidneys. What are tips for following this plan? General guidelines  Most people on this plan should limit their sodium intake to 1,500-2,000 mg (milligrams) of sodium each day. Reading food labels   The Nutrition Facts label lists the amount of sodium in one serving of the food. If you eat more than one serving, you must multiply the listed amount of sodium by the number of servings.  Choose foods with less than 140 mg of sodium per serving.  Avoid foods with 300 mg of sodium or more per  serving. Shopping  Look for lower-sodium products, often labeled as "low-sodium" or "no salt added."  Always check the sodium content even if foods are labeled as "unsalted" or "no salt added".  Buy fresh foods. ? Avoid canned foods and premade or frozen meals. ? Avoid canned, cured, or processed meats  Buy breads that have less than 80 mg of sodium per slice. Cooking  Eat more home-cooked food and less restaurant, buffet, and fast food.  Avoid adding salt when cooking. Use salt-free seasonings or herbs instead of table salt or sea salt. Check with your health care provider or pharmacist before using salt substitutes.  Cook with plant-based oils, such as canola, sunflower, or olive oil. Meal planning  When eating at a restaurant, ask that your food be prepared with less salt or no salt, if possible.  Avoid foods that contain MSG (monosodium glutamate). MSG is sometimes added to [place] food, bouillon, and some canned foods. What foods are recommended? The items listed may not be a complete list. Talk with your dietitian about what dietary choices are best for you. Grains Low-sodium cereals, including oats, puffed wheat and rice, and shredded wheat. Low-sodium crackers. Unsalted rice. Unsalted pasta. Low-sodium bread. Whole-grain breads and whole-grain pasta. Vegetables Fresh or frozen vegetables. "No salt added" canned vegetables. "No salt added" tomato sauce and paste. Low-sodium or reduced-sodium tomato and vegetable juice. Fruits Fresh, frozen, or canned fruit. Fruit juice. Meats and other protein foods Fresh or frozen (no salt added) meat, poultry, seafood, and  fish. Low-sodium canned tuna and salmon. Unsalted nuts. Dried peas, beans, and lentils without added salt. Unsalted canned beans. Eggs. Unsalted nut butters. Dairy Milk. Soy milk. Cheese that is naturally low in sodium, such as ricotta cheese, fresh mozzarella, or Swiss cheese Low-sodium or reduced-sodium cheese. Cream  cheese. Yogurt. Fats and oils Unsalted butter. Unsalted margarine with no trans fat. Vegetable oils such as canola or olive oils. Seasonings and other foods Fresh and dried herbs and spices. Salt-free seasonings. Low-sodium mustard and ketchup. Sodium-free salad dressing. Sodium-free light mayonnaise. Fresh or refrigerated horseradish. Lemon juice. Vinegar. Homemade, reduced-sodium, or low-sodium soups. Unsalted popcorn and pretzels. Low-salt or salt-free chips. What foods are not recommended? The items listed may not be a complete list. Talk with your dietitian about what dietary choices are best for you. Grains Instant hot cereals. Bread stuffing, pancake, and biscuit mixes. Croutons. Seasoned rice or pasta mixes. Noodle soup cups. Boxed or frozen macaroni and cheese. Regular salted crackers. Self-rising flour. Vegetables Sauerkraut, pickled vegetables, and relishes. Olives. [place] fries. Onion rings. Regular canned vegetables (not low-sodium or reduced-sodium). Regular canned tomato sauce and paste (not low-sodium or reduced-sodium). Regular tomato and vegetable juice (not low-sodium or reduced-sodium). Frozen vegetables in sauces. Meats and other protein foods Meat or fish that is salted, canned, smoked, spiced, or pickled. Bacon, ham, sausage, hotdogs, corned beef, chipped beef, packaged lunch meats, salt pork, jerky, pickled herring, anchovies, regular canned tuna, sardines, salted nuts. Dairy Processed cheese and cheese spreads. Cheese curds. Blue cheese. Feta cheese. String cheese. Regular cottage cheese. Buttermilk. Canned milk. Fats and oils Salted butter. Regular margarine. Ghee. Bacon fat. Seasonings and other foods Onion salt, garlic salt, seasoned salt, table salt, and sea salt. Canned and packaged gravies. Worcestershire sauce. Tartar sauce. Barbecue sauce. Teriyaki sauce. Soy sauce, including reduced-sodium. Steak sauce. Fish sauce. Oyster sauce. Cocktail sauce. Horseradish that you  find on the shelf. Regular ketchup and mustard. Meat flavorings and tenderizers. Bouillon cubes. Hot sauce and Tabasco sauce. Premade or packaged marinades. Premade or packaged taco seasonings. Relishes. Regular salad dressings. Salsa. Potato and tortilla chips. Corn chips and puffs. Salted popcorn and pretzels. Canned or dried soups. Pizza. Frozen entrees and pot pies. Summary  Eating less sodium can help lower your blood pressure, reduce swelling, and protect your heart, liver, and kidneys.  Most people on this plan should limit their sodium intake to 1,500-2,000 mg (milligrams) of sodium each day.  Canned, boxed, and frozen foods are high in sodium. Restaurant foods, fast foods, and pizza are also very high in sodium. You also get sodium by adding salt to food.  Try to cook at home, eat more fresh fruits and vegetables, and eat less fast food, canned, processed, or prepared foods. This information is not intended to replace advice given to you by your health care provider. Make sure you discuss any questions you have with your health care provider. Document Released: 07/19/2001 Document Revised: 01/09/2017 Document Reviewed: 01/21/2016 Elsevier Patient Education  2020 [employer].

## 2018-10-13 NOTE — Telephone Encounter (Signed)
Please check on referral and give me a call

## 2018-10-14 ENCOUNTER — Ambulatory Visit: Payer: Medicare HMO | Admitting: Cardiovascular Disease

## 2018-10-15 ENCOUNTER — Other Ambulatory Visit (HOSPITAL_COMMUNITY)
Admission: RE | Admit: 2018-10-15 | Discharge: 2018-10-15 | Disposition: A | Discharge status: Home / Self Care | Payer: Medicare HMO | Source: Ambulatory Visit | Attending: Otolaryngology | Admitting: Otolaryngology

## 2018-10-15 DIAGNOSIS — Z20828 Contact with and (suspected) exposure to other viral communicable diseases: Secondary | ICD-10-CM | POA: Diagnosis not present

## 2018-10-15 DIAGNOSIS — Z01812 Encounter for preprocedural laboratory examination: Secondary | ICD-10-CM | POA: Insufficient documentation

## 2018-10-16 LAB — NOVEL CORONAVIRUS, NAA (HOSP ORDER, SEND-OUT TO REF LAB; TAT 18-24 HRS): SARS-CoV-2, NAA: NOT DETECTED

## 2018-10-19 ENCOUNTER — Encounter (HOSPITAL_BASED_OUTPATIENT_CLINIC_OR_DEPARTMENT_OTHER)
Admission: RE | Disposition: A | Discharge status: Home / Self Care | Payer: Self-pay | Source: Home / Self Care | Attending: Otolaryngology

## 2018-10-19 ENCOUNTER — Other Ambulatory Visit: Payer: Self-pay

## 2018-10-19 ENCOUNTER — Encounter (HOSPITAL_BASED_OUTPATIENT_CLINIC_OR_DEPARTMENT_OTHER): Payer: Self-pay | Admitting: *Deleted

## 2018-10-19 ENCOUNTER — Ambulatory Visit (HOSPITAL_BASED_OUTPATIENT_CLINIC_OR_DEPARTMENT_OTHER): Payer: Medicare HMO | Admitting: Anesthesiology

## 2018-10-19 ENCOUNTER — Ambulatory Visit (HOSPITAL_BASED_OUTPATIENT_CLINIC_OR_DEPARTMENT_OTHER)
Admission: RE | Admit: 2018-10-19 | Discharge: 2018-10-19 | Disposition: A | Discharge status: Home / Self Care | Payer: Medicare HMO | Attending: Otolaryngology | Admitting: Otolaryngology

## 2018-10-19 DIAGNOSIS — F17200 Nicotine dependence, unspecified, uncomplicated: Secondary | ICD-10-CM | POA: Diagnosis not present

## 2018-10-19 DIAGNOSIS — J342 Deviated nasal septum: Secondary | ICD-10-CM | POA: Diagnosis not present

## 2018-10-19 DIAGNOSIS — J343 Hypertrophy of nasal turbinates: Secondary | ICD-10-CM | POA: Insufficient documentation

## 2018-10-19 DIAGNOSIS — J3489 Other specified disorders of nose and nasal sinuses: Secondary | ICD-10-CM | POA: Diagnosis not present

## 2018-10-19 DIAGNOSIS — J310 Chronic rhinitis: Secondary | ICD-10-CM | POA: Insufficient documentation

## 2018-10-19 DIAGNOSIS — I10 Essential (primary) hypertension: Secondary | ICD-10-CM | POA: Diagnosis not present

## 2018-10-19 HISTORY — DX: Deviated nasal septum: J34.2

## 2018-10-19 HISTORY — PX: NASAL SEPTOPLASTY W/ TURBINOPLASTY: SHX2070

## 2018-10-19 HISTORY — DX: Hypertrophy of nasal turbinates: J34.3

## 2018-10-19 SURGERY — SEPTOPLASTY, NOSE, WITH NASAL TURBINATE REDUCTION
Anesthesia: General | Site: Nose | Laterality: Bilateral

## 2018-10-19 MED ORDER — SUGAMMADEX SODIUM 500 MG/5ML IV SOLN
INTRAVENOUS | Status: DC | PRN
  Administered 2018-10-19: 400 mg via INTRAVENOUS

## 2018-10-19 MED ORDER — METOCLOPRAMIDE HCL 5 MG/ML IJ SOLN: 10.0000 mg | Freq: Once | INTRAMUSCULAR | Status: DC | PRN

## 2018-10-19 MED ORDER — LIDOCAINE-EPINEPHRINE 1 %-1:100000 IJ SOLN
INTRAMUSCULAR | Status: AC
  Filled 2018-10-19: qty 1

## 2018-10-19 MED ORDER — OXYCODONE-ACETAMINOPHEN 5-325 MG PO TABS: 1.0000 | ORAL_TABLET | ORAL | 0 refills | Status: AC | PRN

## 2018-10-19 MED ORDER — MEPERIDINE HCL 25 MG/ML IJ SOLN: 6.2500 mg | INTRAMUSCULAR | Status: DC | PRN

## 2018-10-19 MED ORDER — DEXAMETHASONE SODIUM PHOSPHATE 4 MG/ML IJ SOLN
INTRAMUSCULAR | Status: DC | PRN
  Administered 2018-10-19: 10 mg via INTRAVENOUS

## 2018-10-19 MED ORDER — OXYMETAZOLINE HCL 0.05 % NA SOLN
NASAL | Status: DC | PRN
  Administered 2018-10-19: 1 via TOPICAL

## 2018-10-19 MED ORDER — LIDOCAINE HCL (CARDIAC) PF 100 MG/5ML IV SOSY
PREFILLED_SYRINGE | INTRAVENOUS | Status: DC | PRN
  Administered 2018-10-19: 100 mg via INTRAVENOUS

## 2018-10-19 MED ORDER — MIDAZOLAM HCL 2 MG/2ML IJ SOLN
INTRAMUSCULAR | Status: AC
  Filled 2018-10-19: qty 2

## 2018-10-19 MED ORDER — LIDOCAINE 2% (20 MG/ML) 5 ML SYRINGE
INTRAMUSCULAR | Status: AC
  Filled 2018-10-19: qty 5

## 2018-10-19 MED ORDER — PROPOFOL 10 MG/ML IV BOLUS
INTRAVENOUS | Status: AC
  Filled 2018-10-19: qty 20

## 2018-10-19 MED ORDER — LACTATED RINGERS IV SOLN: INTRAVENOUS | Status: DC

## 2018-10-19 MED ORDER — OXYMETAZOLINE HCL 0.05 % NA SOLN
NASAL | Status: AC
  Filled 2018-10-19: qty 30

## 2018-10-19 MED ORDER — MUPIROCIN 2 % EX OINT
TOPICAL_OINTMENT | CUTANEOUS | Status: AC
  Filled 2018-10-19: qty 22

## 2018-10-19 MED ORDER — MIDAZOLAM HCL 2 MG/2ML IJ SOLN
1.0000 mg | INTRAMUSCULAR | Status: DC | PRN
  Administered 2018-10-19: 09:00:00 2 mg via INTRAVENOUS

## 2018-10-19 MED ORDER — AMOXICILLIN 875 MG PO TABS: 875.0000 mg | ORAL_TABLET | Freq: Two times a day (BID) | ORAL | 0 refills | Status: AC

## 2018-10-19 MED ORDER — CEFAZOLIN SODIUM-DEXTROSE 2-3 GM-%(50ML) IV SOLR
INTRAVENOUS | Status: DC | PRN
  Administered 2018-10-19: 3 g via INTRAVENOUS

## 2018-10-19 MED ORDER — DEXAMETHASONE SODIUM PHOSPHATE 10 MG/ML IJ SOLN
INTRAMUSCULAR | Status: AC
  Filled 2018-10-19: qty 1

## 2018-10-19 MED ORDER — FENTANYL CITRATE (PF) 100 MCG/2ML IJ SOLN
50.0000 ug | INTRAMUSCULAR | Status: DC | PRN
  Administered 2018-10-19: 09:00:00 100 ug via INTRAVENOUS

## 2018-10-19 MED ORDER — EPHEDRINE SULFATE 50 MG/ML IJ SOLN
INTRAMUSCULAR | Status: DC | PRN
  Administered 2018-10-19: 10 mg via INTRAVENOUS

## 2018-10-19 MED ORDER — FENTANYL CITRATE (PF) 100 MCG/2ML IJ SOLN
INTRAMUSCULAR | Status: AC
  Filled 2018-10-19: qty 2

## 2018-10-19 MED ORDER — PROPOFOL 10 MG/ML IV BOLUS
INTRAVENOUS | Status: DC | PRN
  Administered 2018-10-19: 200 mg via INTRAVENOUS

## 2018-10-19 MED ORDER — ONDANSETRON HCL 4 MG/2ML IJ SOLN
INTRAMUSCULAR | Status: DC | PRN
  Administered 2018-10-19: 4 mg via INTRAVENOUS

## 2018-10-19 MED ORDER — LABETALOL HCL 5 MG/ML IV SOLN
INTRAVENOUS | Status: DC | PRN
  Administered 2018-10-19 (×2): 5 mg via INTRAVENOUS

## 2018-10-19 MED ORDER — FENTANYL CITRATE (PF) 100 MCG/2ML IJ SOLN
25.0000 ug | INTRAMUSCULAR | Status: DC | PRN
  Administered 2018-10-19: 25 ug via INTRAVENOUS
  Administered 2018-10-19: 50 ug via INTRAVENOUS

## 2018-10-19 MED ORDER — MUPIROCIN 2 % EX OINT
TOPICAL_OINTMENT | CUTANEOUS | Status: DC | PRN
  Administered 2018-10-19: 1 via NASAL

## 2018-10-19 MED ORDER — LIDOCAINE-EPINEPHRINE 1 %-1:100000 IJ SOLN
INTRAMUSCULAR | Status: DC | PRN
  Administered 2018-10-19: 2 mL

## 2018-10-19 MED ORDER — SUGAMMADEX SODIUM 500 MG/5ML IV SOLN
INTRAVENOUS | Status: AC
  Filled 2018-10-19: qty 5

## 2018-10-19 MED ORDER — ONDANSETRON HCL 4 MG/2ML IJ SOLN
INTRAMUSCULAR | Status: AC
  Filled 2018-10-19: qty 2

## 2018-10-19 MED ORDER — LACTATED RINGERS IV SOLN
INTRAVENOUS | Status: DC
  Administered 2018-10-19 (×2): via INTRAVENOUS

## 2018-10-19 MED ORDER — ROCURONIUM BROMIDE 100 MG/10ML IV SOLN
INTRAVENOUS | Status: DC | PRN
  Administered 2018-10-19: 50 mg via INTRAVENOUS

## 2018-10-19 MED ORDER — SCOPOLAMINE 1 MG/3DAYS TD PT72: 1.0000 | MEDICATED_PATCH | Freq: Once | TRANSDERMAL | Status: DC

## 2018-10-19 SURGICAL SUPPLY — 40 items
ATTRACTOMAT 16X20 MAGNETIC DRP (DRAPES) IMPLANT
BLADE SURG 15 STRL LF DISP TIS (BLADE) IMPLANT
BLADE SURG 15 STRL SS (BLADE) ×3
CANISTER SUCT 1200ML W/VALVE (MISCELLANEOUS) ×3 IMPLANT
COAGULATOR SUCT 8FR VV (MISCELLANEOUS) ×3 IMPLANT
COVER WAND RF STERILE (DRAPES) IMPLANT
DECANTER SPIKE VIAL GLASS SM (MISCELLANEOUS) ×2 IMPLANT
DRSG NASOPORE 8CM (GAUZE/BANDAGES/DRESSINGS) IMPLANT
DRSG TELFA 3X8 NADH (GAUZE/BANDAGES/DRESSINGS) IMPLANT
ELECT REM PT RETURN 9FT ADLT (ELECTROSURGICAL) ×3
ELECTRODE REM PT RTRN 9FT ADLT (ELECTROSURGICAL) ×1 IMPLANT
GLOVE BIO SURGEON STRL SZ 6.5 (GLOVE) ×1 IMPLANT
GLOVE BIO SURGEON STRL SZ7.5 (GLOVE) ×3 IMPLANT
GLOVE BIO SURGEONS STRL SZ 6.5 (GLOVE) ×1
GLOVE BIOGEL PI IND STRL 7.0 (GLOVE) IMPLANT
GLOVE BIOGEL PI INDICATOR 7.0 (GLOVE) ×4
GLOVE ECLIPSE 7.0 STRL STRAW (GLOVE) ×2 IMPLANT
GOWN STRL REUS W/ TWL LRG LVL3 (GOWN DISPOSABLE) ×2 IMPLANT
GOWN STRL REUS W/TWL LRG LVL3 (GOWN DISPOSABLE) ×9
NDL HYPO 25X1 1.5 SAFETY (NEEDLE) ×1 IMPLANT
NEEDLE HYPO 25X1 1.5 SAFETY (NEEDLE) ×3 IMPLANT
NS IRRIG 1000ML POUR BTL (IV SOLUTION) ×3 IMPLANT
PACK BASIN DAY SURGERY FS (CUSTOM PROCEDURE TRAY) ×3 IMPLANT
PACK ENT DAY SURGERY (CUSTOM PROCEDURE TRAY) ×3 IMPLANT
PAD DRESSING TELFA 3X8 NADH (GAUZE/BANDAGES/DRESSINGS) IMPLANT
SLEEVE SCD COMPRESS KNEE MED (MISCELLANEOUS) ×2 IMPLANT
SOLUTION BUTLER CLEAR DIP (MISCELLANEOUS) ×3 IMPLANT
SPLINT NASAL AIRWAY SILICONE (MISCELLANEOUS) ×2 IMPLANT
SPONGE GAUZE 2X2 8PLY STER LF (GAUZE/BANDAGES/DRESSINGS) ×1
SPONGE GAUZE 2X2 8PLY STRL LF (GAUZE/BANDAGES/DRESSINGS) ×2 IMPLANT
SPONGE NEURO XRAY DETECT 1X3 (DISPOSABLE) ×3 IMPLANT
SUT CHROMIC 4 0 P 3 18 (SUTURE) ×3 IMPLANT
SUT PLAIN 4 0 ~~LOC~~ 1 (SUTURE) ×5 IMPLANT
SUT PROLENE 3 0 PS 2 (SUTURE) ×2 IMPLANT
SUT VIC AB 4-0 P-3 18XBRD (SUTURE) IMPLANT
SUT VIC AB 4-0 P3 18 (SUTURE)
TOWEL GREEN STERILE FF (TOWEL DISPOSABLE) ×3 IMPLANT
TUBE SALEM SUMP 12R W/ARV (TUBING) IMPLANT
TUBE SALEM SUMP 16 FR W/ARV (TUBING) ×3 IMPLANT
YANKAUER SUCT BULB TIP NO VENT (SUCTIONS) ×3 IMPLANT

## 2018-10-19 NOTE — Anesthesia Procedure Notes (Signed)
Procedure Name: Intubation Performed by: Verita Lamb, CRNA Pre-anesthesia Checklist: Patient identified, Suction available, Emergency Drugs available, Patient being monitored and Timeout performed Patient Re-evaluated:Patient Re-evaluated prior to induction Oxygen Delivery Method: Circle system utilized Preoxygenation: Pre-oxygenation with 100% oxygen Induction Type: IV induction Ventilation: Mask ventilation without difficulty Laryngoscope Size: Mac and 4 Grade View: Grade III Tube type: Oral Tube size: 8.0 mm Number of attempts: 1 Airway Equipment and Method: Stylet Secured at: 24 cm Tube secured with: Tape Dental Injury: Teeth and Oropharynx as per pre-operative assessment  Future Recommendations: Recommend- induction with short-acting agent, and alternative techniques readily available Comments: Pt very anterior, required two hand mask with oral airway and head lift with cricoid pressure to intubate.  Could only see the epiglottis.  Intubated with mac 4 blade. Atraumatically.  Recommend glidescope for future intubations.

## 2018-10-19 NOTE — Anesthesia Postprocedure Evaluation (Signed)
Anesthesia Post Note  Patient: Jordan Hines  Procedure(s) Performed: NASAL SEPTOPLASTY WITH  BILATERAL TURBINATE REDUCTION (Bilateral Nose)     Patient location during evaluation: PACU Anesthesia Type: General Level of consciousness: awake and alert Pain management: pain level controlled Vital Signs Assessment: post-procedure vital signs reviewed and stable Respiratory status: spontaneous breathing, nonlabored ventilation, respiratory function stable and patient connected to nasal cannula oxygen Cardiovascular status: blood pressure returned to baseline and stable Postop Assessment: no apparent nausea or vomiting Anesthetic complications: no    Last Vitals:  Vitals:   10/19/18 1130 10/19/18 1153  BP: 129/82 (!) 149/96  Pulse: 70 73  Resp: 13 16  Temp:  37 C  SpO2: 97% 98%    Last Pain:  Vitals:   10/19/18 1153  TempSrc:   PainSc: 3                  Montez Hageman

## 2018-10-19 NOTE — Discharge Instructions (Addendum)

## 2018-10-19 NOTE — Transfer of Care (Signed)
Immediate Anesthesia Transfer of Care Note  Patient: Jordan Hines  Procedure(s) Performed: NASAL SEPTOPLASTY WITH  BILATERAL TURBINATE REDUCTION (Bilateral Nose)  Patient Location: PACU  Anesthesia Type:General  Level of Consciousness: awake, alert  and oriented  Airway & Oxygen Therapy: Patient Spontanous Breathing and Patient connected to face mask oxygen  Post-op Assessment: Report given to RN and Post -op Vital signs reviewed and stable  Post vital signs: Reviewed and stable  Last Vitals:  Vitals Value Taken Time  BP 126/91 10/19/18 1042  Temp    Pulse 73 10/19/18 1045  Resp 17 10/19/18 1045  SpO2 100 % 10/19/18 1045  Vitals shown include unvalidated device data.  Last Pain:  Vitals:   10/19/18 0752  TempSrc: Oral  PainSc: 0-No pain         Complications: No apparent anesthesia complications

## 2018-10-19 NOTE — Anesthesia Preprocedure Evaluation (Signed)
Anesthesia Evaluation  Patient identified by MRN, date of birth, ID band Patient awake    Reviewed: Allergy & Precautions, NPO status , Patient's Chart, lab work & pertinent test results  Airway Mallampati: II  TM Distance: >3 FB Neck ROM: Full    Dental no notable dental hx.    Pulmonary Current Smoker,    Pulmonary exam normal breath sounds clear to auscultation       Cardiovascular hypertension, Pt. on medications negative cardio ROS Normal cardiovascular exam Rhythm:Regular Rate:Normal     Neuro/Psych negative neurological ROS  negative psych ROS   GI/Hepatic negative GI ROS, Neg liver ROS,   Endo/Other  negative endocrine ROS  Renal/GU negative Renal ROS  negative genitourinary   Musculoskeletal negative musculoskeletal ROS (+)   Abdominal   Peds negative pediatric ROS (+)  Hematology negative hematology ROS (+)   Anesthesia Other Findings   Reproductive/Obstetrics negative OB ROS                             Anesthesia Physical Anesthesia Plan  ASA: II  Anesthesia Plan: General   Post-op Pain Management:    Induction: Intravenous  PONV Risk Score and Plan: 1 and Ondansetron and Treatment may vary due to age or medical condition  Airway Management Planned: Oral ETT  Additional Equipment:   Intra-op Plan:   Post-operative Plan: Extubation in OR  Informed Consent: I have reviewed the patients History and Physical, chart, labs and discussed the procedure including the risks, benefits and alternatives for the proposed anesthesia with the patient or authorized representative who has indicated his/her understanding and acceptance.     Dental advisory given  Plan Discussed with: CRNA  Anesthesia Plan Comments:         Anesthesia Quick Evaluation

## 2018-10-19 NOTE — Op Note (Signed)
DATE OF PROCEDURE: 10/19/2018  OPERATIVE REPORT   SURGEON: [name], MD   PREOPERATIVE DIAGNOSES:  1. Severe nasal septal deviation.  2. Bilateral inferior turbinate hypertrophy.  3. Chronic nasal obstruction.  POSTOPERATIVE DIAGNOSES:  1. Severe nasal septal deviation.  2. Bilateral inferior turbinate hypertrophy.  3. Chronic nasal obstruction.  PROCEDURE PERFORMED:  1. Septoplasty.  2. Bilateral partial inferior turbinate resection.   ANESTHESIA: General endotracheal tube anesthesia.   COMPLICATIONS: None.   ESTIMATED BLOOD LOSS: 150 mL.   INDICATION FOR PROCEDURE: Jordan Hines is a 68 y.o. male with a history of chronic nasal obstruction. The patient was  treated with antihistamine, decongestant, and steroid nasal spray. However, the patient continued to be symptomatic. On examination, the patient was noted to have bilateral severe inferior turbinate hypertrophy and significant nasal septal deviation, causing significant nasal obstruction. Based on the above findings, the decision was made for the patient to undergo the above-stated procedures. The risks, benefits, alternatives, and details of the procedures were discussed with the patient. Questions were invited and answered. Informed consent was obtained.   DESCRIPTION OF PROCEDURE: The patient was taken to the operating room and placed supine on the operating table. General endotracheal tube anesthesia was administered by the anesthesiologist. The patient was positioned, and prepped and draped in the standard fashion for nasal surgery. Pledgets soaked with Afrin were placed in both nasal cavities for decongestion. The pledgets were subsequently removed.   Examination of the nasal cavity revealed a severe nasal septal deviationd. 1% lidocaine with 1:100,000 epinephrine was injected onto the nasal septum bilaterally. A hemitransfixion incision was made on the left side. The mucosal flap was carefully elevated on the left side. A  cartilaginous incision was made 1 cm superior to the caudal margin of the nasal septum. Mucosal flap was also elevated on the right side in the similar fashion. It should be noted that due to the severe septal deviation, the deviated portion of the cartilaginous and bony septum had to be removed in piecemeal fashion. Once the deviated portions were removed, a straight midline septum was achieved. The septum was then quilted with 4-0 plain gut sutures. The hemitransfixion incision was closed with interrupted 4-0 chromic sutures.   The inferior one half of both hypertrophied inferior turbinate was crossclamped with a Kelly clamp. The inferior one half of each inferior turbinate was then resected with a pair of cross cutting scissors. Hemostasis was achieved with a suction cautery device.  Doyle splints were applied to the nasal septum.  The care of the patient was turned over to the anesthesiologist. The patient was awakened from anesthesia without difficulty. The patient was extubated and transferred to the recovery room in good condition.   OPERATIVE FINDINGS: Severe nasal septal deviation and bilateral inferior turbinate hypertrophy.   SPECIMEN: None.   FOLLOWUP CARE: The patient be discharged home once he is awake and alert. The patient will be placed on Percocet 1 tablets p.o. q.4 hours p.r.n. pain, and amoxicillin 875 mg p.o. b.i.d. for 3 days. The patient will follow up in my office in 3 days for splint removal.   Enzo Treu Raynelle Bring, MD

## 2018-10-19 NOTE — H&P (Signed)
Cc: Chronic nasal obstruction  HPI: The patient is a 68 year old male who returns today for his follow-up evaluation. The patient was last seen in 02/2018.  At that time, he was complaining of chronic nasal obstruction.  He was noted to have chronic/allergic rhinitis with nasal septal deviation and bilateral inferior turbinate hypertrophy. The patient was treated with nasal saline irrigation and Flonase nasal spray.  He was also instructed to use Atrovent prn nasal drainage.  The patient returns today complaining of persistent nasal obstruction.  He is a habitual mouth breather.  He denies any significant facial pain, purulent drainage or fever.   Exam: The nasal cavities were decongested and anesthetised with a combination of oxymetazoline and 4% lidocaine solution.  The flexible scope was inserted into the right nasal cavity.  Endoscopy of the inferior and middle meatus was performed.  Edematous mucosa was noted.  NSD noted. No polyp, mass, or lesion was appreciated.  Olfactory cleft was clear.  Nasopharynx was clear.  Turbinates were hypertrophied but without mass.  Incomplete response to decongestion.  The procedure was repeated on the contralateral side with similar findings.  The patient tolerated the procedure well.  Instructions were given to avoid eating or drinking for 2 hours.    Assessment: 1.  Persistent chronic rhinitis with nasal mucosal congestion, nasal septal deviation to the left and bilateral inferior turbinate hypertrophy.  More than 95% of his nasal passageways are obstructed bilaterally.  2.  No purulent drainage or acute infection is noted today.   Plan: 1.  The nasal endoscopy findings are reviewed with the patient.  2.  The patient should continue with his current allergy treatment regimen.  3.  Based on the above findings, he is a candidate to undergo septoplasty and turbinate reduction to improve his nasal passageways.  The risks, benefits, alternatives and details of the  procedures are reviewed.  4.  The patient would like to proceed with the procedures.

## 2018-10-20 ENCOUNTER — Encounter (HOSPITAL_BASED_OUTPATIENT_CLINIC_OR_DEPARTMENT_OTHER): Payer: Self-pay | Admitting: Otolaryngology

## 2018-11-08 ENCOUNTER — Other Ambulatory Visit: Payer: Self-pay | Admitting: Family Medicine

## 2018-11-08 DIAGNOSIS — I10 Essential (primary) hypertension: Secondary | ICD-10-CM

## 2018-11-11 ENCOUNTER — Other Ambulatory Visit: Payer: Self-pay | Admitting: Family Medicine

## 2018-11-11 NOTE — Telephone Encounter (Signed)
Requested medication (s) are due for refill today: yes  Requested medication (s) are on the active medication list: yes  Last refill:  08/12/2018  Future visit scheduled: no  Notes to clinic: last filled by historical provider    Requested Prescriptions  Pending Prescriptions Disp Refills   sertraline (ZOLOFT) 50 MG tablet [Pharmacy Med Name: SERTRALINE HCL 50 MG Tablet] 90 tablet     Sig: TAKE 1 TABLET EVERY DAY (MUST KEEP UPCOMING APPOINTMENT)     Psychiatry:  Antidepressants - SSRI Failed - 11/11/2018  3:25 PM      Failed - Completed PHQ-2 or PHQ-9 in the last 360 days.      Passed - Valid encounter within last 6 months    Recent Outpatient Visits          2 months ago Gout of right foot, unspecified cause, unspecified chronicity   [facility], Ranell Patrick, MD   4 months ago Fatigue, unspecified type   [facility], Ranell Patrick, MD   5 months ago    [facility], Ranell Patrick, MD   7 months ago Acute gout involving toe of right foot, unspecified cause   [facility], Fenton Malling, MD   7 months ago Medicare annual wellness visit, subsequent   [facility], Ranell Patrick, MD      Future Appointments            In 2 months [facility], Charlann Lange, MD [place]

## 2018-11-12 NOTE — Telephone Encounter (Signed)
Please advise this med was discontinued.

## 2018-11-12 NOTE — Telephone Encounter (Signed)
Discussed in July, decided to remain on 50 mg of Zoloft.  Refill placed

## 2018-12-10 ENCOUNTER — Ambulatory Visit (INDEPENDENT_AMBULATORY_CARE_PROVIDER_SITE_OTHER): Payer: Medicare HMO | Admitting: Family Medicine

## 2018-12-10 ENCOUNTER — Encounter: Payer: Self-pay | Admitting: Family Medicine

## 2018-12-10 ENCOUNTER — Other Ambulatory Visit: Payer: Self-pay

## 2018-12-10 VITALS — BP 135/77 | HR 82 | Temp 97.6°F | Wt 270.2 lb

## 2018-12-10 DIAGNOSIS — R21 Rash and other nonspecific skin eruption: Secondary | ICD-10-CM | POA: Diagnosis not present

## 2018-12-10 DIAGNOSIS — M2011 Hallux valgus (acquired), right foot: Secondary | ICD-10-CM | POA: Diagnosis not present

## 2018-12-10 DIAGNOSIS — Z23 Encounter for immunization: Secondary | ICD-10-CM | POA: Diagnosis not present

## 2018-12-10 DIAGNOSIS — L309 Dermatitis, unspecified: Secondary | ICD-10-CM

## 2018-12-10 DIAGNOSIS — M79674 Pain in right toe(s): Secondary | ICD-10-CM

## 2018-12-10 MED ORDER — TRIAMCINOLONE ACETONIDE 0.1 % EX CREA: 1.0000 "application " | TOPICAL_CREAM | Freq: Two times a day (BID) | CUTANEOUS | 0 refills | Status: DC

## 2018-12-10 NOTE — Patient Instructions (Addendum)
  Eucerin or other lotion to the dry patch on your left foot along with steroid cream up to twice per day as needed.  Recheck the area in 1 month.  I will check an x-ray, but suspect some component of a bunion in your foot and will refer you to podiatry to evaluate for treatment options.  Wearing a shoe with a wider toe box for more room inside can be helpful for now.   If you have lab work done today you will be contacted with your lab results within the next 2 weeks.  If you have not heard from [place] then please contact us. The fastest way to get your results is to register for My Chart.   IF you received an x-ray today, you will receive an invoice from [facility] Rad[facility]. Please contact [facility] Radiology at [phone] with questions or concerns regarding your invoice.   IF you received labwork today, you will receive an invoice from [facility]. Please contact [facility] at [phone] with questions or concerns regarding your invoice.   Our billing staff will not be able to assist you with questions regarding bills from these companies.  You will be contacted with the lab results as soon as they are available. The fastest way to get your results is to activate your My Chart account. Instructions are located on the last page of this paperwork. If you have not heard from [place] regarding the results in 2 weeks, please contact this office.

## 2018-12-10 NOTE — Progress Notes (Signed)
Subjective:    Patient ID: Jordan Hines, male    DOB: 1951/01/06, 68 y.o.   MRN: [id number]  HPI Jordan Hines is a 68 y.o. male Presents today for: Chief Complaint  Patient presents with  . Gout    right big toe.On meds for gout but no better. Little soreness in toe all the time   Gout: No apparent acute flare when evaluated in July but suspecteed degenerative changes in the right  MTP.  Uric acid okay as below, continued on allopurinol.  Daily meds: allopurinol 100mg  QD. No missed doses, taking daily. No new side effects.  Some chronic soreness, no recent swelling/redness/heat. No known recent injury - rarely bumps it.  Prn med: none. Tx: none. Has started to buy wider/larger shoes.   Lab Results  Component Value Date   LABURIC 5.8 08/20/2018   L foot rash: 10 years. Dry/scaly skin with itching at times. Tx: occasional lotion.    Patient Active Problem List   Diagnosis Date Noted  . Acute gouty arthritis 02/26/2018  . Non-allergic rhinitis 01/06/2018   Past Medical History:  Diagnosis Date  . Anxiety   . Arthritis   . Cancer ([place])   . Depression   . Deviated septum   . Hypertension   . Nasal turbinate hypertrophy    Past Surgical History:  Procedure Laterality Date  . COLONOSCOPY    . NASAL SEPTOPLASTY W/ TURBINOPLASTY Bilateral 10/19/2018   Procedure: NASAL SEPTOPLASTY WITH  BILATERAL TURBINATE REDUCTION;  Surgeon: [name], MD;  Location: [facility];  Service: ENT;  Laterality: Bilateral;  . PROSTATE SURGERY     No Known Allergies Prior to Admission medications   Medication Sig Start Date End Date Taking? Authorizing Provider  allopurinol (ZYLOPRIM) 100 MG tablet Take 1 tablet (100 mg total) by mouth daily. 04/15/18  Yes [name], MD  lisinopril (ZESTRIL) 10 MG tablet TAKE 1 TABLET EVERY DAY (MUST KEEP UPCOMING APPOINTMENT) 11/08/18  Yes [name], MD  metoprolol succinate (TOPROL-XL) 25 MG 24 hr tablet Take 1 tablet (25 mg  total) by mouth daily. 08/20/18  Yes [name], MD  sertraline (ZOLOFT) 50 MG tablet TAKE 1 TABLET EVERY DAY (MUST KEEP UPCOMING APPOINTMENT) 11/12/18  Yes [name], MD  sildenafil (VIAGRA) 100 MG tablet Take 100 mg by mouth daily. 04/19/18  Yes [provider]   Social History   Socioeconomic History  . Marital status: Divorced    Spouse name: Not on file  . Number of children: 1  . Years of education: Not on file  . Highest education level: Some college, no degree  Occupational History  . Not on file  Social Needs  . Financial resource strain: Not hard at all  . Food insecurity    Worry: Never true    Inability: Never true  . Transportation needs    Medical: No    Non-medical: No  Tobacco Use  . Smoking status: Current Every Day Smoker    Types: Cigars  . Smokeless tobacco: Never Used  . Tobacco comment: about 7 small cigars daily  Substance and Sexual Activity  . Alcohol use: Yes    Alcohol/week: 7.0 standard drinks    Types: 7 Cans of beer per week    Comment: 1-3 shots /day  . Drug use: No  . Sexual activity: Yes  Lifestyle  . Physical activity    Days per week: 0 days    Minutes per session:  0 min  . Stress: Not at all  Relationships  . Social connections    Talks on phone: More than three times a week    Gets together: Once a week    Attends religious service: Never    Active member of club or organization: No    Attends meetings of clubs or organizations: Never    Relationship status: Divorced  . Intimate partner violence    Fear of current or ex partner: No    Emotionally abused: No    Physically abused: No    Forced sexual activity: No  Other Topics Concern  . Not on file  Social History Narrative  . Not on file    Review of Systems Per HPI     Objective:   Physical Exam Vitals signs reviewed.  Constitutional:      General: He is not in acute distress.    Appearance: He is well-developed.  HENT:     Head:  Normocephalic and atraumatic.  Cardiovascular:     Rate and Rhythm: Normal rate.  Pulmonary:     Effort: Pulmonary effort is normal.  Musculoskeletal:     Right foot: Normal range of motion. Tenderness and bony tenderness (Slight hallux valgus deformity with prominence of MTP on the right.  Minimal tenderness on lateral MTP, no warmth/erythema/swelling.  Other foot nontender.) present. No swelling.       Feet:  Neurological:     Mental Status: He is alert and oriented to person, place, and time.    Vitals:   12/10/18 1055  BP: 135/77  Pulse: 82  Temp: 97.6 F (36.4 C)  TempSrc: Oral  SpO2: 100%  Weight: 270 lb 3.2 oz (122.6 kg)   No results found.     Assessment & Plan:   Jordan Hines is a 68 y.o. male Pain of great toe, right - Plan: DG Foot 2 Views Right, Ambulatory referral to Podiatry Hallux valgus of right foot - Plan: DG Foot 2 Views Right, Ambulatory referral to Podiatry  -Chronic gout but less likely because of persistent pain.  Appears to have component of hallux valgus deformity.  Will refer to podiatry to evaluate for treatment options.  X-ray obtained.  Wider toe box shoes recommended for now.  Need for prophylactic vaccination and inoculation against influenza - Plan: Flu Vaccine QUAD High Dose(Fluad)  Eczema, unspecified type - Plan: triamcinolone cream (KENALOG) 0.1 % Rash of foot - Plan: triamcinolone cream (KENALOG) 0.1 %  -Appears to be atopic dermatitis, will try steroid topically, hydrating lotion, recheck 1 month with option to see dermatology.  Meds ordered this encounter  Medications  . triamcinolone cream (KENALOG) 0.1 %    Sig: Apply 1 application topically 2 (two) times daily.    Dispense:  30 g    Refill:  0   Patient Instructions    Eucerin or other lotion to the dry patch on your left foot along with steroid cream up to twice per day as needed.  Recheck the area in 1 month.  I will check an x-ray, but suspect some component of a  bunion in your foot and will refer you to podiatry to evaluate for treatment options.  Wearing a shoe with a wider toe box for more room inside can be helpful for now.   If you have lab work done today you will be contacted with your lab results within the next 2 weeks.  If you have not heard from [place] then please contact  us. The fastest way to get your results is to register for My Chart.   IF you received an x-ray today, you will receive an invoice from [facility] Radiology. Please contact [facility] Radiology at [phone] with questions or concerns regarding your invoice.   IF you received labwork today, you will receive an invoice from [place]. Please contact [facility] at [phone] with questions or concerns regarding your invoice.   Our billing staff will not be able to assist you with questions regarding bills from these companies.  You will be contacted with the lab results as soon as they are available. The fastest way to get your results is to activate your My Chart account. Instructions are located on the last page of this paperwork. If you have not heard from [place] regarding the results in 2 weeks, please contact this office.       Signed,   Merri Ray, MD [facility].  12/10/18 11:40 AM

## 2019-01-10 ENCOUNTER — Other Ambulatory Visit: Payer: Self-pay | Admitting: Family Medicine

## 2019-01-10 ENCOUNTER — Encounter: Payer: Self-pay | Admitting: Family Medicine

## 2019-01-10 ENCOUNTER — Ambulatory Visit (INDEPENDENT_AMBULATORY_CARE_PROVIDER_SITE_OTHER): Payer: Medicare HMO | Admitting: Family Medicine

## 2019-01-10 ENCOUNTER — Other Ambulatory Visit: Payer: Self-pay

## 2019-01-10 VITALS — BP 121/81 | HR 76 | Temp 98.3°F | Wt 273.0 lb

## 2019-01-10 DIAGNOSIS — S60947A Unspecified superficial injury of left little finger, initial encounter: Secondary | ICD-10-CM

## 2019-01-10 DIAGNOSIS — T148XXA Other injury of unspecified body region, initial encounter: Secondary | ICD-10-CM

## 2019-01-10 DIAGNOSIS — L309 Dermatitis, unspecified: Secondary | ICD-10-CM | POA: Diagnosis not present

## 2019-01-10 DIAGNOSIS — R21 Rash and other nonspecific skin eruption: Secondary | ICD-10-CM | POA: Diagnosis not present

## 2019-01-10 NOTE — Patient Instructions (Addendum)
   Rash is improving.  Okay to continue triamcinolone up to twice per day, but also use a lotion such as Eucerin or Aveeno once or twice per day for the dry skin.  If it does not continue to improve in the next month or 2, or any worsening symptoms, let me know.  Area on the finger appears to be a healing blood blister.  That should continue to heal.  Thank you for coming in today.  Return to the clinic or go to the nearest emergency room if any of your symptoms worsen or new symptoms occur.   If you have lab work done today you will be contacted with your lab results within the next 2 weeks.  If you have not heard from [place] then please contact us. The fastest way to get your results is to register for My Chart.   IF you received an x-ray today, you will receive an invoice from [facility] Radiology. Please contact [facility] Radiology at [phone] with questions or concerns regarding your invoice.   IF you received labwork today, you will receive an invoice from [place]. Please contact [facility] at [phone] with questions or concerns regarding your invoice.   Our billing staff will not be able to assist you with questions regarding bills from these companies.  You will be contacted with the lab results as soon as they are available. The fastest way to get your results is to activate your My Chart account. Instructions are located on the last page of this paperwork. If you have not heard from [place] regarding the results in 2 weeks, please contact this office.

## 2019-01-10 NOTE — Progress Notes (Signed)
Subjective:  Patient ID: Jordan Hines, male    DOB: November 04, 1950  Age: 68 y.o. MRN: [id number]  CC:  Chief Complaint  Patient presents with  . Rash    1 month f/u rash on left foot. Better but have not gone away. Can not get into see the foot Dr until Dec 14    HPI Jordan Hines presents for   L foot rash: 6 cm x 2 cm thickened hyperpigmented patch on medial left foot discussed October 30.  Thought to be eczema.  Recommended Eucerin along with triamcinolone twice daily. Using triamcinolone only - BID, sometimes QD.  Seems to be clearing up.   Hallux valgus deformity also discussed last visit, plan for podiatry eval.  Recommended wider toe box shoes.   L 5th finger discoloration.  Pinched pad in wheel lock. Looked like blood area, but no bleeding.    History Patient Active Problem List   Diagnosis Date Noted  . Acute gouty arthritis 02/26/2018  . Non-allergic rhinitis 01/06/2018   Past Medical History:  Diagnosis Date  . Anxiety   . Arthritis   . Cancer ([place])   . Depression   . Deviated septum   . Hypertension   . Nasal turbinate hypertrophy    Past Surgical History:  Procedure Laterality Date  . COLONOSCOPY    . NASAL SEPTOPLASTY W/ TURBINOPLASTY Bilateral 10/19/2018   Procedure: NASAL SEPTOPLASTY WITH  BILATERAL TURBINATE REDUCTION;  Surgeon: [name], MD;  Location: [facility];  Service: ENT;  Laterality: Bilateral;  . PROSTATE SURGERY     No Known Allergies Prior to Admission medications   Medication Sig Start Date End Date Taking? Authorizing Provider  allopurinol (ZYLOPRIM) 100 MG tablet Take 1 tablet (100 mg total) by mouth daily. 04/15/18  Yes [name], MD  lisinopril (ZESTRIL) 10 MG tablet TAKE 1 TABLET EVERY DAY (MUST KEEP UPCOMING APPOINTMENT) 11/08/18  Yes [name], MD  metoprolol succinate (TOPROL-XL) 25 MG 24 hr tablet Take 1 tablet (25 mg total) by mouth daily. 08/20/18  Yes [name], MD  sertraline (ZOLOFT)  50 MG tablet TAKE 1 TABLET EVERY DAY (MUST KEEP UPCOMING APPOINTMENT) 11/12/18  Yes [name], MD  sildenafil (VIAGRA) 100 MG tablet Take 100 mg by mouth daily. 04/19/18  Yes [provider]  triamcinolone cream (KENALOG) 0.1 % Apply 1 application topically 2 (two) times daily. 12/10/18  Yes [name], MD   Social History   Socioeconomic History  . Marital status: Divorced    Spouse name: Not on file  . Number of children: 1  . Years of education: Not on file  . Highest education level: Some college, no degree  Occupational History  . Not on file  Social Needs  . Financial resource strain: Not hard at all  . Food insecurity    Worry: Never true    Inability: Never true  . Transportation needs    Medical: No    Non-medical: No  Tobacco Use  . Smoking status: Current Every Day Smoker    Types: Cigars  . Smokeless tobacco: Never Used  . Tobacco comment: about 7 small cigars daily  Substance and Sexual Activity  . Alcohol use: Yes    Alcohol/week: 7.0 standard drinks    Types: 7 Cans of beer per week    Comment: 1-3 shots /day  . Drug use: No  . Sexual activity: Yes  Lifestyle  . Physical activity  Days per week: 0 days    Minutes per session: 0 min  . Stress: Not at all  Relationships  . Social connections    Talks on phone: More than three times a week    Gets together: Once a week    Attends religious service: Never    Active member of club or organization: No    Attends meetings of clubs or organizations: Never    Relationship status: Divorced  . Intimate partner violence    Fear of current or ex partner: No    Emotionally abused: No    Physically abused: No    Forced sexual activity: No  Other Topics Concern  . Not on file  Social History Narrative  . Not on file    Review of Systems Per HPI.   Objective:   Vitals:   01/10/19 1023  BP: 121/81  Pulse: 76  Temp: 98.3 F (36.8 C)  TempSrc: Oral  SpO2: 98%  Weight: 273 lb  (123.8 kg)     Physical Exam Vitals signs reviewed.  Constitutional:      General: He is not in acute distress.    Appearance: He is well-developed.  HENT:     Head: Normocephalic and atraumatic.  Cardiovascular:     Rate and Rhythm: Normal rate.  Pulmonary:     Effort: Pulmonary effort is normal.  Musculoskeletal:       Hands:       Feet:  Neurological:     Mental Status: He is alert and oriented to person, place, and time.        Assessment & Plan:  Jordan Hines is a 68 y.o. male . Eczema, unspecified type Rash of foot  -Improving.  Continue triamcinolone 1-2 times per day, add Eucerin or Aveeno hydrating lotion.  RTC precautions if worsening or not continuing to resolve   Blood blister  -Healing, no concerning findings on exam.  Reassurance provided.  15-month follow-up for blood pressure, routine medications  No orders of the defined types were placed in this encounter.  Patient Instructions     Rash is improving.  Okay to continue triamcinolone up to twice per day, but also use a lotion such as Eucerin or Aveeno once or twice per day for the dry skin.  If it does not continue to improve in the next month or 2, or any worsening symptoms, let me know.  Area on the finger appears to be a healing blood blister.  That should continue to heal.  Thank you for coming in today.  Return to the clinic or go to the nearest emergency room if any of your symptoms worsen or new symptoms occur.   If you have lab work done today you will be contacted with your lab results within the next 2 weeks.  If you have not heard from [place] then please contact us. The fastest way to get your results is to register for My Chart.   IF you received an x-ray today, you will receive an invoice from [facility] Radiology. Please contact [facility] Radiology at [phone] with questions or concerns regarding your invoice.   IF you received labwork today, you will receive an invoice from  [place]. Please contact [facility] at [phone] with questions or concerns regarding your invoice.   Our billing staff will not be able to assist you with questions regarding bills from these companies.  You will be contacted with the lab results as soon as they are available. The fastest  way to get your results is to activate your My Chart account. Instructions are located on the last page of this paperwork. If you have not heard from [place] regarding the results in 2 weeks, please contact this office.          Signed, Merri Ray, MD [facility]

## 2019-01-10 NOTE — Progress Notes (Signed)
Virtual Visit via Video Note   This visit type was conducted due to national recommendations for restrictions regarding the COVID-19 Pandemic (e.g. social distancing) in an effort to limit this patient's exposure and mitigate transmission in our community.  Due to his co-morbid illnesses, this patient is at least at moderate risk for complications without adequate follow up.  This format is felt to be most appropriate for this patient at this time.  All issues noted in this document were discussed and addressed.  A limited physical exam was performed with this format.  Please refer to the patient's chart for his consent to telehealth for [facility].   Date:  01/13/2019   ID:  Jordan Hines, DOB [birth year], MRN [id number]  Patient Location: Home Provider Location: Office  PCP:  [name], MD  Cardiologist:  Larae Grooms, MD  Electrophysiologist:  None   Evaluation Performed:  Follow-Up Visit  Chief Complaint:  DOE/fatigue  History of Present Illness:    Jordan Hines is a 68 y.o. male who was initially seen for DOE/fatigue in 10/2018.  Sinus surgery was planned as well.  Plan was :  1. "DOE/fatigue: Normal cardiac exam and ECG.  I think he is deconditioned.  He does not have cardiac sx while walking up stairs, 1 flight.  Some of his sx are likely from deconditioning.  2. HTN: The current medical regimen is effective;  continue present plan and medications. 3. Morbid Obesity: Increase exercise to target below.  He needs to cut out fast food and alcohol.  He lives alone so this has limited his cooking at home.  Avoid fast foods.  Try to lose 10 lbs in 12 weeks.  No further cardiac testing needed before sinus surgery."    He had the surgery successfully- sinus sx are better, but not back to normal.   Since the last visit, he feels [facility] is better.  He still has some fatigue.    He is walking 2 miles daily.    Denies : Chest pain. Dizziness. Leg edema.  Nitroglycerin use. Orthopnea. Palpitations. Paroxysmal nocturnal dyspnea.  Syncope.     The patient does not have symptoms concerning for COVID-19 infection (fever, chills, cough, or new shortness of breath).    Past Medical History:  Diagnosis Date  . Anxiety   . Arthritis   . Cancer ([place])   . Depression   . Deviated septum   . Hypertension   . Nasal turbinate hypertrophy    Past Surgical History:  Procedure Laterality Date  . COLONOSCOPY    . NASAL SEPTOPLASTY W/ TURBINOPLASTY Bilateral 10/19/2018   Procedure: NASAL SEPTOPLASTY WITH  BILATERAL TURBINATE REDUCTION;  Surgeon: [name], MD;  Location: [facility];  Service: ENT;  Laterality: Bilateral;  . PROSTATE SURGERY       Current Meds  Medication Sig  . allopurinol (ZYLOPRIM) 100 MG tablet Take 1 tablet (100 mg total) by mouth daily.  Marland Kitchen lisinopril (ZESTRIL) 10 MG tablet TAKE 1 TABLET EVERY DAY (MUST KEEP UPCOMING APPOINTMENT)  . metoprolol succinate (TOPROL-XL) 25 MG 24 hr tablet Take 1 tablet (25 mg total) by mouth daily.  . sertraline (ZOLOFT) 50 MG tablet TAKE 1 TABLET EVERY DAY (MUST KEEP UPCOMING APPOINTMENT)  . sildenafil (VIAGRA) 100 MG tablet Take 100 mg by mouth daily.  Marland Kitchen triamcinolone cream (KENALOG) 0.1 % Apply 1 application topically 2 (two) times daily.     Allergies:   Patient has no known allergies.  Social History   Tobacco Use  . Smoking status: Current Every Day Smoker    Types: Cigars  . Smokeless tobacco: Never Used  . Tobacco comment: about 7 small cigars daily  Substance Use Topics  . Alcohol use: Yes    Alcohol/week: 7.0 standard drinks    Types: 7 Cans of beer per week    Comment: 1-3 shots /day  . Drug use: No     Family Hx: The patient's family history includes Asthma in his mother; Cancer in his father; Diabetes in his father; Heart disease in his mother. There is no history of Allergic rhinitis, Eczema, or Urticaria.  ROS:   Please see the history of present  illness.    Still with residual sinus sx All other systems reviewed and are negative.   Prior CV studies:   The following studies were reviewed today:  LDL 87 in 06/2018  Labs/Other Tests and Data Reviewed:    EKG:  No ECG reviewed.  Recent Labs: 06/22/2018: ALT 18; Hemoglobin 14.6; Platelets 226; TSH 2.300 08/20/2018: BUN 13; Creatinine, Ser 0.98; NT-Pro BNP 57; Potassium 4.8; Sodium 140   Recent Lipid Panel Lab Results  Component Value Date/Time   CHOL 171 06/22/2018 10:38 AM   TRIG 129 06/22/2018 10:38 AM   HDL 58 06/22/2018 10:38 AM   CHOLHDL 2.9 06/22/2018 10:38 AM   CHOLHDL 2.7 03/12/2015 03:59 PM   LDLCALC 87 06/22/2018 10:38 AM    Wt Readings from Last 3 Encounters:  01/13/19 273 lb (123.8 kg)  01/10/19 273 lb (123.8 kg)  12/10/18 270 lb 3.2 oz (122.6 kg)     Objective:    Vital Signs:  BP 121/80   Ht 6' (1.829 m)   Wt 273 lb (123.8 kg)   BMI 37.03 kg/m    VITAL SIGNS:  reviewed GEN:  no acute distress RESPIRATORY:  normal respiratory effort, symmetric expansion CARDIOVASCULAR:  no peripheral edema NEURO:  alert and oriented x 3, no obvious focal deficit PSYCH:  normal affect video format limits exam  ASSESSMENT & PLAN:    1. DOE/fatigue: Improved.  COntinue regular exercise.   2. HTN: The current medical regimen is effective;  continue present plan and medications.   3. Morbid obesity: Weight stable. Stressed plant based diet. He feels that he does not eat much.  Decreased processed food intake.    COVID-19 Education: The signs and symptoms of COVID-19 were discussed with the patient and how to seek care for testing (follow up with PCP or arrange E-visit).  The importance of social distancing was discussed today.  Time:   Today, I have spent  minutes with the patient with telehealth technology discussing the above problems.     Medication Adjustments/Labs and Tests Ordered: Current medicines are reviewed at length with the patient today.   Concerns regarding medicines are outlined above.   Tests Ordered: No orders of the defined types were placed in this encounter.   Medication Changes: No orders of the defined types were placed in this encounter.   Follow Up:  Either In Person or Virtual prn  Signed, Larae Grooms, MD  01/13/2019 10:01 AM    [place]

## 2019-01-13 ENCOUNTER — Telehealth (INDEPENDENT_AMBULATORY_CARE_PROVIDER_SITE_OTHER): Payer: Medicare HMO | Admitting: Interventional Cardiology

## 2019-01-13 ENCOUNTER — Encounter: Payer: Self-pay | Admitting: Interventional Cardiology

## 2019-01-13 ENCOUNTER — Other Ambulatory Visit: Payer: Self-pay

## 2019-01-13 VITALS — BP 121/80 | Ht 72.0 in | Wt 273.0 lb

## 2019-01-13 DIAGNOSIS — R0600 Dyspnea, unspecified: Secondary | ICD-10-CM | POA: Diagnosis not present

## 2019-01-13 DIAGNOSIS — R0609 Other forms of dyspnea: Secondary | ICD-10-CM

## 2019-01-13 DIAGNOSIS — R5383 Other fatigue: Secondary | ICD-10-CM | POA: Diagnosis not present

## 2019-01-13 DIAGNOSIS — I10 Essential (primary) hypertension: Secondary | ICD-10-CM | POA: Diagnosis not present

## 2019-01-13 NOTE — Patient Instructions (Signed)
Medication Instructions:  Your physician recommends that you continue on your current medications as directed. Please refer to the Current Medication list given to you today.  If you need a refill on your cardiac medications before your next appointment, please call your pharmacy.   Lab work: None Ordered  If you have labs (blood work) drawn today and your tests are completely normal, you will receive your results only by: Marland Kitchen MyChart Message (if you have MyChart) OR . A paper copy in the mail If you have any lab test that is abnormal or we need to change your treatment, we will call you to review the results.  Testing/Procedures: None ordered  Follow-Up: . AS NEEDED  Any Other Special Instructions Will Be Listed Below (If Applicable).   Heart-Healthy Eating Plan Heart-healthy meal planning includes:  Eating less unhealthy fats.  Eating more healthy fats.  Making other changes in your diet. Talk with your doctor or a diet specialist (dietitian) to create an eating plan that is right for you. What is my plan? Your doctor may recommend an eating plan that includes:  Total fat: ______% or less of total calories a day.  Saturated fat: ______% or less of total calories a day.  Cholesterol: less than _________mg a day. What are tips for following this plan? Cooking Avoid frying your food. Try to bake, boil, grill, or broil it instead. You can also reduce fat by:  Removing the skin from poultry.  Removing all visible fats from meats.  Steaming vegetables in water or broth. Meal planning   At meals, divide your plate into four equal parts: ? Fill one-half of your plate with vegetables and green salads. ? Fill one-fourth of your plate with whole grains. ? Fill one-fourth of your plate with lean protein foods.  Eat 4-5 servings of vegetables per day. A serving of vegetables is: ? 1 cup of raw or cooked vegetables. ? 2 cups of raw leafy greens.  Eat 4-5 servings of  fruit per day. A serving of fruit is: ? 1 medium whole fruit. ?  cup of dried fruit. ?  cup of fresh, frozen, or canned fruit. ?  cup of 100% fruit juice.  Eat more foods that have soluble fiber. These are apples, broccoli, carrots, beans, peas, and barley. Try to get 20-30 g of fiber per day.  Eat 4-5 servings of nuts, legumes, and seeds per week: ? 1 serving of dried beans or legumes equals  cup after being cooked. ? 1 serving of nuts is  cup. ? 1 serving of seeds equals 1 tablespoon. General information  Eat more home-cooked food. Eat less restaurant, buffet, and fast food.  Limit or avoid alcohol.  Limit foods that are high in starch and sugar.  Avoid fried foods.  Lose weight if you are overweight.  Keep track of how much salt (sodium) you eat. This is important if you have high blood pressure. Ask your doctor to tell you more about this.  Try to add vegetarian meals each week. Fats  Choose healthy fats. These include olive oil and canola oil, flaxseeds, walnuts, almonds, and seeds.  Eat more omega-3 fats. These include salmon, mackerel, sardines, tuna, flaxseed oil, and ground flaxseeds. Try to eat fish at least 2 times each week.  Check food labels. Avoid foods with trans fats or high amounts of saturated fat.  Limit saturated fats. ? These are often found in animal products, such as meats, butter, and cream. ? These are also  found in plant foods, such as palm oil, palm kernel oil, and coconut oil.  Avoid foods with partially hydrogenated oils in them. These have trans fats. Examples are stick margarine, some tub margarines, cookies, crackers, and other baked goods. What foods can I eat? Fruits All fresh, canned (in natural juice), or frozen fruits. Vegetables Fresh or frozen vegetables (raw, steamed, roasted, or grilled). Green salads. Grains Most grains. Choose whole wheat and whole grains most of the time. Rice and pasta, including brown rice and pastas  made with whole wheat. Meats and other proteins Lean, well-trimmed beef, veal, pork, and lamb. Chicken and [place] without skin. All fish and shellfish. Wild duck, rabbit, pheasant, and venison. Egg whites or low-cholesterol egg substitutes. Dried beans, peas, lentils, and tofu. Seeds and most nuts. Dairy Low-fat or nonfat cheeses, including ricotta and mozzarella. Skim or 1% milk that is liquid, powdered, or evaporated. Buttermilk that is made with low-fat milk. Nonfat or low-fat yogurt. Fats and oils Non-hydrogenated (trans-free) margarines. Vegetable oils, including soybean, sesame, sunflower, olive, peanut, safflower, corn, canola, and cottonseed. Salad dressings or mayonnaise made with a vegetable oil. Beverages Mineral water. Coffee and tea. Diet carbonated beverages. Sweets and desserts Sherbet, gelatin, and fruit ice. Small amounts of dark chocolate. Limit all sweets and desserts. Seasonings and condiments All seasonings and condiments. The items listed above may not be a complete list of foods and drinks you can eat. Contact a dietitian for more options. What foods should I avoid? Fruits Canned fruit in heavy syrup. Fruit in cream or butter sauce. Fried fruit. Limit coconut. Vegetables Vegetables cooked in cheese, cream, or butter sauce. Fried vegetables. Grains Breads that are made with saturated or trans fats, oils, or whole milk. Croissants. Sweet rolls. Donuts. High-fat crackers, such as cheese crackers. Meats and other proteins Fatty meats, such as hot dogs, ribs, sausage, bacon, rib-eye roast or steak. High-fat deli meats, such as salami and bologna. Caviar. Domestic duck and goose. Organ meats, such as liver. Dairy Cream, sour cream, cream cheese, and creamed cottage cheese. Whole-milk cheeses. Whole or 2% milk that is liquid, evaporated, or condensed. Whole buttermilk. Cream sauce or high-fat cheese sauce. Yogurt that is made from whole milk. Fats and oils Meat fat, or  shortening. Cocoa butter, hydrogenated oils, palm oil, coconut oil, palm kernel oil. Solid fats and shortenings, including bacon fat, salt pork, lard, and butter. Nondairy cream substitutes. Salad dressings with cheese or sour cream. Beverages Regular sodas and juice drinks with added sugar. Sweets and desserts Frosting. Pudding. Cookies. Cakes. Pies. Milk chocolate or white chocolate. Buttered syrups. Full-fat ice cream or ice cream drinks. The items listed above may not be a complete list of foods and drinks to avoid. Contact a dietitian for more information. Summary  Heart-healthy meal planning includes eating less unhealthy fats, eating more healthy fats, and making other changes in your diet.  Eat a balanced diet. This includes fruits and vegetables, low-fat or nonfat dairy, lean protein, nuts and legumes, whole grains, and heart-healthy oils and fats. This information is not intended to replace advice given to you by your health care provider. Make sure you discuss any questions you have with your health care provider. Document Released: 07/29/2011 Document Revised: 04/02/2017 Document Reviewed: 03/06/2017 Elsevier Patient Education  2020 [employer].

## 2019-01-24 ENCOUNTER — Ambulatory Visit (INDEPENDENT_AMBULATORY_CARE_PROVIDER_SITE_OTHER): Payer: Medicare HMO

## 2019-01-24 ENCOUNTER — Other Ambulatory Visit: Payer: Self-pay | Admitting: Family Medicine

## 2019-01-24 ENCOUNTER — Ambulatory Visit (INDEPENDENT_AMBULATORY_CARE_PROVIDER_SITE_OTHER): Payer: Medicare HMO | Admitting: Podiatry

## 2019-01-24 ENCOUNTER — Other Ambulatory Visit: Payer: Self-pay

## 2019-01-24 ENCOUNTER — Ambulatory Visit: Payer: Medicare HMO

## 2019-01-24 DIAGNOSIS — M7751 Other enthesopathy of right foot: Secondary | ICD-10-CM | POA: Diagnosis not present

## 2019-01-24 DIAGNOSIS — M7752 Other enthesopathy of left foot: Secondary | ICD-10-CM

## 2019-01-24 DIAGNOSIS — M722 Plantar fascial fibromatosis: Secondary | ICD-10-CM | POA: Diagnosis not present

## 2019-01-24 DIAGNOSIS — M109 Gout, unspecified: Secondary | ICD-10-CM

## 2019-01-24 MED ORDER — COLCHICINE 0.6 MG PO TABS: 0.6000 mg | ORAL_TABLET | Freq: Every day | ORAL | 0 refills | Status: DC

## 2019-01-24 NOTE — Telephone Encounter (Signed)
Requested medication (s) are due for refill today: yes  Requested medication (s) are on the active medication list: yes  Last refill:  07/14/2018  Future visit scheduled:yes  Notes to clinic: script last filled by Dr.Hopper  Review for refill   Requested Prescriptions  Pending Prescriptions Disp Refills   allopurinol (ZYLOPRIM) 100 MG tablet [Pharmacy Med Name: ALLOPURINOL 100 MG Tablet] 90 tablet     Sig: TAKE 1 [place]      Endocrinology:  Gout Agents Passed - 01/24/2019  1:03 PM      Passed - Uric Acid in normal range and within 360 days    Uric Acid  Date Value Ref Range Status  08/20/2018 5.8 3.7 - 8.6 mg/dL Final    Comment:               Therapeutic target for gout patients: <6.0          Passed - Cr in normal range and within 360 days    Creat  Date Value Ref Range Status  10/18/2015 1.14 0.70 - 1.25 mg/dL Final    Comment:      For patients > or = 70 years of age: The upper reference limit for Creatinine is approximately 13% higher for people identified as African-American.      Creatinine, Ser  Date Value Ref Range Status  08/20/2018 0.98 0.76 - 1.27 mg/dL Final          Passed - Valid encounter within last 12 months    Recent Outpatient Visits           2 weeks ago Eczema, unspecified type   [facility], Ranell Patrick, MD   1 month ago Pain of great toe, right   [facility], Ranell Patrick, MD   5 months ago Gout of right foot, unspecified cause, unspecified chronicity   [facility], Ranell Patrick, MD   7 months ago Fatigue, unspecified type   [facility], Ranell Patrick, MD   7 months ago    [facility], Ranell Patrick, MD       Future Appointments             In 1 month Carlota Raspberry Ranell Patrick, MD [facility] at [place], [facility]

## 2019-01-26 NOTE — Progress Notes (Signed)
   Subjective: 68 y.o. male presenting today as a new patient with a chief complaint of sharp stabbing pain of the bilateral great toes and right heel that began about 5 months ago. Walking at an incline increases the pain. He has been applying arthritis cream for treatment. He reports h/o gout. Patient is here for further evaluation and treatment.   Past Medical History:  Diagnosis Date  . Anxiety   . Arthritis   . Cancer ([place])   . Depression   . Deviated septum   . Hypertension   . Nasal turbinate hypertrophy      Objective: Physical Exam General: The patient is alert and oriented x3 in no acute distress.  Dermatology: Skin is warm, dry and supple bilateral lower extremities. Negative for open lesions or macerations bilateral.   Vascular: Dorsalis Pedis and Posterior Tibial pulses palpable bilateral.  Capillary fill time is immediate to all digits.  Neurological: Epicritic and protective threshold intact bilateral.   Musculoskeletal: Tenderness to palpation to the plantar aspect of the right heel along the plantar fascia. All other joints range of motion within normal limits bilateral. Strength 5/5 in all groups bilateral.   Radiographic exam: Normal osseous mineralization. Joint spaces preserved. No fracture/dislocation/boney destruction. No other soft tissue abnormalities or radiopaque foreign bodies.   Assessment: 1. Plantar fasciitis right 2. H/o gout 1st MPJ bilateral - currently asymptomatic   Plan of Care:  1. Patient evaluated. Xrays reviewed.   2. Injection of 0.5cc Celestone soluspan injected into the right plantar fascia  3. Refill prescription for Colcrys 0.6 mg #30 provided to patient.  4. Recommended good shoe gear.  5. Return to clinic as needed.    Edrick Kins, DPM [facility]  Dr. Edrick Kins, DPM    [address], [place] [address]                Office [phone]  Fax [phone]

## 2019-02-02 ENCOUNTER — Other Ambulatory Visit: Payer: Self-pay

## 2019-02-02 ENCOUNTER — Telehealth: Payer: Self-pay | Admitting: Family Medicine

## 2019-02-02 ENCOUNTER — Ambulatory Visit (INDEPENDENT_AMBULATORY_CARE_PROVIDER_SITE_OTHER): Payer: Medicare HMO | Admitting: Family Medicine

## 2019-02-02 ENCOUNTER — Other Ambulatory Visit: Payer: Self-pay | Admitting: *Deleted

## 2019-02-02 ENCOUNTER — Encounter: Payer: Self-pay | Admitting: Family Medicine

## 2019-02-02 VITALS — BP 128/79 | HR 74 | Temp 98.7°F | Ht 72.0 in | Wt 272.2 lb

## 2019-02-02 DIAGNOSIS — R21 Rash and other nonspecific skin eruption: Secondary | ICD-10-CM

## 2019-02-02 DIAGNOSIS — L309 Dermatitis, unspecified: Secondary | ICD-10-CM

## 2019-02-02 DIAGNOSIS — H60311 Diffuse otitis externa, right ear: Secondary | ICD-10-CM | POA: Diagnosis not present

## 2019-02-02 MED ORDER — NEOMYCIN-POLYMYXIN-HC 3.5-10000-1 OT SOLN: OTIC | 0 refills | Status: DC

## 2019-02-02 MED ORDER — TRIAMCINOLONE ACETONIDE 0.1 % EX CREA: 1.0000 "application " | TOPICAL_CREAM | Freq: Two times a day (BID) | CUTANEOUS | 0 refills | Status: DC

## 2019-02-02 NOTE — Patient Instructions (Addendum)
   Use the eardrops 3 or 4 drops in the right ear twice daily. Cortisporin otic.  About once a day starting tomorrow irrigate the ear gently with lukewarm tap water to try and clean the mucus out.  Return if the area gets worse  Continue using the triamcinolone cream on the rash on your foot. However I would also combined with it a little terbinafine or clotrimazole cream that you can buy over-the-counter (ask the pharmacist if you can find it). Use this about twice daily.   If the rash does not get better over the next month or so return because you may need to have a punch biopsy to send to the pathologist to get it looked at to see if why it is not getting better.   If you have lab work done today you will be contacted with your lab results within the next 2 weeks.  If you have not heard from [place] then please contact us. The fastest way to get your results is to register for My Chart.   IF you received an x-ray today, you will receive an invoice from [facility]. Please contact [facility] at [phone] with questions or concerns regarding your invoice.   IF you received labwork today, you will receive an invoice from [facility]. Please contact [facility] at [phone] with questions or concerns regarding your invoice.   Our billing staff will not be able to assist you with questions regarding bills from these companies.  You will be contacted with the lab results as soon as they are available. The fastest way to get your results is to activate your My Chart account. Instructions are located on the last page of this paperwork. If you have not heard from [place] regarding the results in 2 weeks, please contact this office.

## 2019-02-02 NOTE — Telephone Encounter (Signed)
Pt at pharmacy waiting for a prescription. He said it should be a cream to treat the rash on his foot. Pt requesting fill   I spoke with Almyra Free to address

## 2019-02-02 NOTE — Telephone Encounter (Signed)
Spoke with pt. Jordan Hines sent in a prescription for [employer]

## 2019-02-02 NOTE — Progress Notes (Signed)
Patient ID: Jordan Hines, male    DOB: Jun 04, 1950  Age: 68 y.o. MRN: [id number]  Chief Complaint  Patient presents with  . right ear fogginess    4-5 days   . Eczema    refill of cream     Subjective:   Patient is here for 2 problems. First he is having problems with his right ear for the last for 5 days. He has got some whitish drainage. He tried to wash out wax. He feels by liquid sensation when he presses on his tragus. The pain is mild. No fever. No cold. Has not had a lot of trouble with his ears in the past. He is 68 years old.  Also has a rash on his left foot medially between the heel and the medial malleolus. He has been using triamcinolone cream on that would like some more of it. It seemed to do a little bit better but never cleared up completely.  Current allergies, medications, problem list, past/family and social histories reviewed.  Objective:  BP 128/79   Pulse 74   Temp 98.7 F (37.1 C) (Temporal)   Ht 6' (1.829 m)   Wt 272 lb 3.2 oz (123.5 kg)   SpO2 97%   BMI 36.92 kg/m   No major acute distress. Left TM looks old and dull and white behind the drum. Right TM looks similar with quite below the lower half of the drum. Not an air-fluid level, rather more a chronic appearance from the look of the left ear also. The canal is coated with a whitish mucus. Not much wax. Not really red or inflamed. Minimal tenderness.  Assessment & Plan:   Assessment: 1. Acute diffuse otitis externa of right ear   2. Rash of foot       Plan: See instructions. Return if worse. The ankle rash could be a lichen sclerosis type thing also and might need a punch biopsy if it continues given troubles.  No orders of the defined types were placed in this encounter.   No orders of the defined types were placed in this encounter.        Patient Instructions     Use the eardrops 3 or 4 drops in the right ear twice daily. Cortisporin otic.  About once a day starting  tomorrow irrigate the ear gently with lukewarm tap water to try and clean the mucus out.  Return if the area gets worse  Continue using the triamcinolone cream on the rash on your foot. However I would also combined with it a little terbinafine or clotrimazole cream that you can buy over-the-counter (ask the pharmacist if you can find it). Use this about twice daily.   If the rash does not get better over the next month or so return because you may need to have a punch biopsy to send to the pathologist to get it looked at to see if why it is not getting better.   If you have lab work done today you will be contacted with your lab results within the next 2 weeks.  If you have not heard from [place] then please contact us. The fastest way to get your results is to register for My Chart.   IF you received an x-ray today, you will receive an invoice from [facility]. Please contact [facility] at [phone] with questions or concerns regarding your invoice.   IF you received labwork today, you will receive an invoice from [facility]. Please contact [facility]  at [phone] with questions or concerns regarding your invoice.   Our billing staff will not be able to assist you with questions regarding bills from these companies.  You will be contacted with the lab results as soon as they are available. The fastest way to get your results is to activate your My Chart account. Instructions are located on the last page of this paperwork. If you have not heard from [place] regarding the results in 2 weeks, please contact this office.         Return if symptoms worsen or fail to improve.   Ruben Reason, MD 02/02/2019

## 2019-02-28 ENCOUNTER — Other Ambulatory Visit: Payer: Self-pay | Admitting: Family Medicine

## 2019-02-28 DIAGNOSIS — I10 Essential (primary) hypertension: Secondary | ICD-10-CM

## 2019-02-28 NOTE — Telephone Encounter (Signed)
Requested Prescriptions  Pending Prescriptions Disp Refills  . lisinopril (ZESTRIL) 10 MG tablet [Pharmacy Med Name: LISINOPRIL 10 MG Tablet] 30 tablet 0    Sig: TAKE 1 TABLET EVERY DAY (MUST KEEP UPCOMING APPOINTMENT)     Cardiovascular:  ACE Inhibitors Failed - 02/28/2019 12:03 PM      Failed - Cr in normal range and within 180 days    Creat  Date Value Ref Range Status  10/18/2015 1.14 0.70 - 1.25 mg/dL Final    Comment:      For patients > or = 71 years of age: The upper reference limit for Creatinine is approximately 13% higher for people identified as African-American.      Creatinine, Ser  Date Value Ref Range Status  08/20/2018 0.98 0.76 - 1.27 mg/dL Final         Failed - K in normal range and within 180 days    Potassium  Date Value Ref Range Status  08/20/2018 4.8 3.5 - 5.2 mmol/L Final         Passed - Patient is not pregnant      Passed - Last BP in normal range    BP Readings from Last 1 Encounters:  02/02/19 128/79         Passed - Valid encounter within last 6 months    Recent Outpatient Visits          3 weeks ago Acute diffuse otitis externa of right ear   [facility] at [facility], Fenton Malling, MD   1 month ago Eczema, unspecified type   [facility] at [facility], Ranell Patrick, MD   2 months ago Pain of great toe, right   [facility] at [place], MD   6 months ago Gout of right foot, unspecified cause, unspecified chronicity   [facility] at [place], MD   8 months ago Fatigue, unspecified type   [facility] at [facility], Ranell Patrick, MD      Future Appointments            In 2 weeks Carlota Raspberry Ranell Patrick, MD [facility] at [place], [facility]

## 2019-03-14 ENCOUNTER — Ambulatory Visit: Payer: Medicare HMO | Admitting: Family Medicine

## 2019-03-24 ENCOUNTER — Other Ambulatory Visit: Payer: Self-pay | Admitting: Family Medicine

## 2019-03-24 DIAGNOSIS — M109 Gout, unspecified: Secondary | ICD-10-CM

## 2019-04-04 ENCOUNTER — Encounter: Payer: Self-pay | Admitting: Family Medicine

## 2019-04-04 ENCOUNTER — Ambulatory Visit (INDEPENDENT_AMBULATORY_CARE_PROVIDER_SITE_OTHER): Payer: Medicare HMO | Admitting: Family Medicine

## 2019-04-04 ENCOUNTER — Other Ambulatory Visit: Payer: Self-pay

## 2019-04-04 VITALS — BP 134/88 | HR 80 | Temp 97.0°F | Ht 72.0 in | Wt 274.0 lb

## 2019-04-04 DIAGNOSIS — F329 Major depressive disorder, single episode, unspecified: Secondary | ICD-10-CM

## 2019-04-04 DIAGNOSIS — R5383 Other fatigue: Secondary | ICD-10-CM

## 2019-04-04 DIAGNOSIS — L309 Dermatitis, unspecified: Secondary | ICD-10-CM | POA: Diagnosis not present

## 2019-04-04 DIAGNOSIS — I10 Essential (primary) hypertension: Secondary | ICD-10-CM | POA: Diagnosis not present

## 2019-04-04 DIAGNOSIS — M109 Gout, unspecified: Secondary | ICD-10-CM | POA: Diagnosis not present

## 2019-04-04 DIAGNOSIS — R21 Rash and other nonspecific skin eruption: Secondary | ICD-10-CM

## 2019-04-04 DIAGNOSIS — F1010 Alcohol abuse, uncomplicated: Secondary | ICD-10-CM

## 2019-04-04 DIAGNOSIS — F32A Depression, unspecified: Secondary | ICD-10-CM

## 2019-04-04 DIAGNOSIS — Z1211 Encounter for screening for malignant neoplasm of colon: Secondary | ICD-10-CM | POA: Diagnosis not present

## 2019-04-04 MED ORDER — ALLOPURINOL 100 MG PO TABS: 100.0000 mg | ORAL_TABLET | Freq: Every day | ORAL | 1 refills | Status: DC

## 2019-04-04 MED ORDER — LISINOPRIL 10 MG PO TABS: 10.0000 mg | ORAL_TABLET | Freq: Every day | ORAL | 1 refills | Status: DC

## 2019-04-04 MED ORDER — COLCHICINE 0.6 MG PO TABS: 0.6000 mg | ORAL_TABLET | Freq: Every day | ORAL | 1 refills | Status: DC

## 2019-04-04 MED ORDER — METOPROLOL SUCCINATE ER 25 MG PO TB24: 25.0000 mg | ORAL_TABLET | Freq: Every day | ORAL | 1 refills | Status: DC

## 2019-04-04 MED ORDER — SERTRALINE HCL 50 MG PO TABS: ORAL_TABLET | ORAL | 2 refills | Status: DC

## 2019-04-04 NOTE — Progress Notes (Signed)
Subjective:  Patient ID: Jordan Hines, male    DOB: 1951-01-02  Age: 69 y.o. MRN: [id number]  CC:  Chief Complaint  Patient presents with  . Rash    R foot chronic 5 yrs, itchiness , dryness pt uses triamcinolone & aveeno  . Fatigue    x 1 to 2 months , pt also states having runny nose .pt concern it may be related to covid vacc 03/18/19    HPI Porfirio Mylar : Med refills and  acute concerns as above  Fatigue Past 1 to 2 months.  Did have Covid vaccine February 5.  Some occasional runny nose. No chest pains, no dyspnea.  No fever. No night sweats or unexplained weight loss.  More tired after meals.  Some intermittent snoring, no known pauses. Nocturia 2 times per night, no prior sleep study, no CPAP.  Seen by cardiology in December. Improved at that time. Some deconditioning  Alcohol: liquor 5 shots per night, more alcohol with pandemic. Denies more depression - just bored.  Have discussed alcohol in past - denies addiction, has not met with specialist. Plans to cut back.    foot rash Discussed left foot rash in October then November last year.  Thought to be eczema.  Recommended Eucerin along with triamcinolone twice daily.  Some improvement at that time. Using triamcinolone QD only. Still improving.   Hypertension: Takes lisinopril 10 mg daily, Toprol 25 mg daily. Home readings: none BP Readings from Last 3 Encounters:  04/04/19 134/88  02/02/19 128/79  01/13/19 121/80   Lab Results  Component Value Date   CREATININE 0.98 08/20/2018   Depression: Zoloft 50 mg daily, feels like working well. Denies drinking for depression. No new side effects.  Depression screen [facility] 2/9 04/04/2019 02/02/2019 01/10/2019 12/10/2018 06/22/2018  Decreased Interest 0 0 0 0 0  Down, Depressed, Hopeless 0 0 0 0 0  PHQ - 2 Score 0 0 0 0 0  Altered sleeping - - - - -  Tired, decreased energy - - - - -  Change in appetite - - - - -  Feeling bad or failure about yourself  - - - - -   Trouble concentrating - - - - -  Moving slowly or fidgety/restless - - - - -  Suicidal thoughts - - - - -  PHQ-9 Score - - - - -  Difficult doing work/chores - - - - -    Gout: Last flare: last week - L big toe. Treated with colchicine. Prior flare 3 months ago.  Daily meds: Allopurinol 100 mg daily Prn med: colchicine.  Lab Results  Component Value Date   LABURIC 5.8 08/20/2018     History Patient Active Problem List   Diagnosis Date Noted  . Acute gouty arthritis 02/26/2018  . Non-allergic rhinitis 01/06/2018   Past Medical History:  Diagnosis Date  . Anxiety   . Arthritis   . Cancer ([place])   . Depression   . Deviated septum   . Hypertension   . Nasal turbinate hypertrophy    Past Surgical History:  Procedure Laterality Date  . COLONOSCOPY    . NASAL SEPTOPLASTY W/ TURBINOPLASTY Bilateral 10/19/2018   Procedure: NASAL SEPTOPLASTY WITH  BILATERAL TURBINATE REDUCTION;  Surgeon: [name], MD;  Location: [facility];  Service: ENT;  Laterality: Bilateral;  . PROSTATE SURGERY     No Known Allergies Prior to Admission medications   Medication Sig Start Date End Date Taking? Authorizing Provider  allopurinol (ZYLOPRIM) 100 MG tablet TAKE 1 TABLET EVERY DAY 03/24/19  Yes [name], MD  colchicine 0.6 MG tablet Take 1 tablet (0.6 mg total) by mouth daily. 01/24/19  Yes Edrick Kins, DPM  lisinopril (ZESTRIL) 10 MG tablet TAKE 1 TABLET EVERY DAY (MUST KEEP UPCOMING APPOINTMENT) 02/28/19  Yes [name], MD  metoprolol succinate (TOPROL-XL) 25 MG 24 hr tablet Take 1 tablet (25 mg total) by mouth daily. 08/20/18  Yes [name], MD  neomycin-polymyxin-hydrocortisone (CORTISPORIN) OTIC solution Use 3 or 4 drops in the right ear twice daily as directed. 02/02/19  Yes [name], MD  sertraline (ZOLOFT) 50 MG tablet TAKE 1 TABLET EVERY DAY (MUST KEEP UPCOMING APPOINTMENT) 01/10/19  Yes [name], MD  sildenafil (VIAGRA) 100 MG tablet  Take 100 mg by mouth daily. 04/19/18  Yes [provider]  triamcinolone cream (KENALOG) 0.1 % Apply 1 application topically 2 (two) times daily. 02/02/19  Yes [name], MD  fluticasone Asencion Islam) 50 MCG/ACT nasal spray  03/11/19   [provider]   Social History   Socioeconomic History  . Marital status: Divorced    Spouse name: Not on file  . Number of children: 1  . Years of education: Not on file  . Highest education level: Some college, no degree  Occupational History  . Not on file  Tobacco Use  . Smoking status: Current Every Day Smoker    Types: Cigars  . Smokeless tobacco: Never Used  . Tobacco comment: about 7 small cigars daily  Substance and Sexual Activity  . Alcohol use: Yes    Alcohol/week: 7.0 standard drinks    Types: 7 Cans of beer per week    Comment: 1-3 shots /day  . Drug use: No  . Sexual activity: Yes  Other Topics Concern  . Not on file  Social History Narrative  . Not on file   Social Determinants of Health   Financial Resource Strain:   . Difficulty of Paying Living Expenses: Not on file  Food Insecurity:   . Worried About Charity fundraiser in the Last Year: Not on file  . Ran Out of Food in the Last Year: Not on file  Transportation Needs:   . Lack of Transportation (Medical): Not on file  . Lack of Transportation (Non-Medical): Not on file  Physical Activity:   . Days of Exercise per Week: Not on file  . Minutes of Exercise per Session: Not on file  Stress:   . Feeling of Stress : Not on file  Social Connections:   . Frequency of Communication with Friends and Family: Not on file  . Frequency of Social Gatherings with Friends and Family: Not on file  . Attends Religious Services: Not on file  . Active Member of Clubs or Organizations: Not on file  . Attends Archivist Meetings: Not on file  . Marital Status: Not on file  Intimate Partner Violence:   . Fear of Current or Ex-Partner: Not on file  .  Emotionally Abused: Not on file  . Physically Abused: Not on file  . Sexually Abused: Not on file    Review of Systems  Constitutional: Positive for fatigue. Negative for unexpected weight change.  Eyes: Negative for visual disturbance.  Respiratory: Negative for cough, chest tightness and shortness of breath.   Cardiovascular: Negative for chest pain, palpitations and leg swelling.  Gastrointestinal: Negative for abdominal pain and blood in stool.  Neurological: Negative for dizziness, light-headedness and headaches.     Objective:   Vitals:   04/04/19 0956  BP: 134/88  Pulse: 80  Temp: (!) 97 F (36.1 C)  TempSrc: Temporal  SpO2: 97%  Weight: 274 lb (124.3 kg)  Height: 6' (1.829 m)     Physical Exam Vitals reviewed.  Constitutional:      Appearance: He is well-developed.  HENT:     Head: Normocephalic and atraumatic.  Eyes:     Pupils: Pupils are equal, round, and reactive to light.  Neck:     Vascular: No carotid bruit or JVD.  Cardiovascular:     Rate and Rhythm: Normal rate and regular rhythm.     Heart sounds: Normal heart sounds. No murmur.  Pulmonary:     Effort: Pulmonary effort is normal.     Breath sounds: Normal breath sounds. No rales.  Skin:    General: Skin is warm and dry.     Comments: 4x6cm hperpigmented, thickened area medial Left foot/ankle - see photo.    Neurological:     Mental Status: He is alert and oriented to person, place, and time.         Assessment & Plan:  KVION SHAPLEY is a 69 y.o. male . Fatigue, unspecified type - Plan: CBC, TSH  -Possibly multifactorial with deconditioning, alcohol overuse, but also with snoring, differential includes obstructive sleep apnea.  -Initially he will try to decrease alcohol use.  Check labs as above.  Recheck 3 weeks, then consider sleep specialist evaluation if persistent symptoms.  RTC precautions if worsening  Essential hypertension - Plan: lisinopril (ZESTRIL) 10 MG tablet,  metoprolol succinate (TOPROL-XL) 25 MG 24 hr tablet, Comprehensive metabolic panel  -Stable.  Continue same regimen.  Check labs  Eczema, unspecified type Rash of foot  -Triamcinolone, Eucerin dosing discussed, overall has improved.  Continue same treatment for now  Gout, unspecified cause, unspecified chronicity, unspecified site - Plan: allopurinol (ZYLOPRIM) 100 MG tablet, colchicine 0.6 MG tablet  -Alcohol decrease as above, check uric acid, no change in meds for now.  Continue allopurinol daily dosing.  Depression, unspecified depression type - Plan: sertraline (ZOLOFT) 50 MG tablet Alcohol abuse  -Denies new depression or self treatment of depression with alcohol.  Continue same dose Zoloft for now, plan for decrease/cutting back on alcohol as above, follow-up in 3 weeks  Screen for colon cancer - Plan: Ambulatory referral to Gastroenterology   Meds ordered this encounter  Medications  . lisinopril (ZESTRIL) 10 MG tablet    Sig: Take 1 tablet (10 mg total) by mouth daily.    Dispense:  90 tablet    Refill:  1  . metoprolol succinate (TOPROL-XL) 25 MG 24 hr tablet    Sig: Take 1 tablet (25 mg total) by mouth daily.    Dispense:  90 tablet    Refill:  1  . sertraline (ZOLOFT) 50 MG tablet    Sig: TAKE 1 TABLET EVERY DAY    Dispense:  90 tablet    Refill:  2  . allopurinol (ZYLOPRIM) 100 MG tablet    Sig: Take 1 tablet (100 mg total) by mouth daily.    Dispense:  90 tablet    Refill:  1  . colchicine 0.6 MG tablet    Sig: Take 1 tablet (0.6 mg total) by mouth daily.    Dispense:  30 tablet    Refill:  1   Patient Instructions    Cut back on alcohol  to no more 2 drinks per night - taper down to that level in next week or two. Here are some resources if needed.  [facility]  Address: [address], [place], [place] [address]  Phone: [phone]  [facility]  Address: [address] [place], [place] [address]  Phone: [phone]  [facility] Address: [place], [place], [place] [address]  Phone: [phone]   I would consider meeting with sleep specialist - can discuss further next visit as you cut back on alcohol.   Ok to use triamcinoloine up to twice per day. Adding eucerin once or twice daily may also help.   Return to the clinic or go to the nearest emergency room if any of your symptoms worsen or new symptoms occur.   If you have lab work done today you will be contacted with your lab results within the next 2 weeks.  If you have not heard from [place] then please contact us. The fastest way to get your results is to register for My Chart.   IF you received an x-ray today, you will receive an invoice from [facility]. Please contact [facility] at [phone] with questions or concerns regarding your invoice.   IF you received labwork today, you will receive an invoice from [facility]. Please contact [facility] at [phone] with questions or concerns regarding your invoice.   Our billing staff will not be able to assist you with questions regarding bills from these companies.  You will be contacted with the lab results as soon as they are available. The fastest way to get your results is to activate your My Chart account. Instructions are located on the last page of this paperwork. If you have not heard from [place] regarding the results in 2 weeks, please contact this office.          Signed, Merri Ray, MD [facility]

## 2019-04-04 NOTE — Progress Notes (Signed)
Referral for colonoscopy requested

## 2019-04-04 NOTE — Patient Instructions (Addendum)
  Cut back on alcohol to no more 2 drinks per night - taper down to that level in next week or two. Here are some resources if needed.  [facility]  Address: [address] [place], [place] [address]  Phone: [phone]  [facility] ([facility]  Address: [address] [place], [place] [address]  Phone: [phone]  [facility] Address: [place], [place], [place] [address]  Phone: [phone]   I would consider meeting with sleep specialist - can discuss further next visit as you cut back on alcohol.   Ok to use triamcinoloine up to twice per day. Adding eucerin once or twice daily may also help.   Return to the clinic or go to the nearest emergency room if any of your symptoms worsen or new symptoms occur.   If you have lab work done today you will be contacted with your lab results within the next 2 weeks.  If you have not heard from [place] then please contact us. The fastest way to get your results is to register for My Chart.   IF you received an x-ray today, you will receive an invoice from [facility]. Please contact [facility] at [phone] with questions or concerns regarding your invoice.   IF you received labwork today, you will receive an invoice from [facility]. Please contact [facility] at [phone] with questions or concerns regarding your invoice.   Our billing staff will not be able to assist you with questions regarding bills from these companies.  You will be contacted with the lab results as soon as they are available. The fastest way to get your results is to activate your My Chart account. Instructions are located on the last page of this paperwork. If you have not heard from [place] regarding the results in 2 weeks, please contact this office.

## 2019-04-05 LAB — CBC
Hematocrit: 43.9 % (ref 37.5–51.0)
Hemoglobin: 15.1 g/dL (ref 13.0–17.7)
MCH: 30.3 pg (ref 26.6–33.0)
MCHC: 34.4 g/dL (ref 31.5–35.7)
MCV: 88 fL (ref 79–97)
Platelets: 243 10*3/uL (ref 150–450)
RBC: 4.99 x10E6/uL (ref 4.14–5.80)
RDW: 14.2 % (ref 11.6–15.4)
WBC: 4 10*3/uL (ref 3.4–10.8)

## 2019-04-05 LAB — COMPREHENSIVE METABOLIC PANEL
ALT: 28 IU/L (ref 0–44)
AST: 24 IU/L (ref 0–40)
Albumin/Globulin Ratio: 1.7 (ref 1.2–2.2)
Albumin: 4.5 g/dL (ref 3.8–4.8)
Alkaline Phosphatase: 100 IU/L (ref 39–117)
BUN/Creatinine Ratio: 15 (ref 10–24)
BUN: 14 mg/dL (ref 8–27)
Bilirubin Total: 0.7 mg/dL (ref 0.0–1.2)
CO2: 24 mmol/L (ref 20–29)
Calcium: 9.5 mg/dL (ref 8.6–10.2)
Chloride: 101 mmol/L (ref 96–106)
Creatinine, Ser: 0.96 mg/dL (ref 0.76–1.27)
GFR calc Af Amer: 92 mL/min/{1.73_m2} (ref >59)
GFR calc non Af Amer: 80 mL/min/{1.73_m2} (ref >59)
Globulin, Total: 2.6 g/dL (ref 1.5–4.5)
Glucose: 113 mg/dL — ABNORMAL HIGH (ref 65–99)
Potassium: 4.7 mmol/L (ref 3.5–5.2)
Sodium: 141 mmol/L (ref 134–144)
Total Protein: 7.1 g/dL (ref 6.0–8.5)

## 2019-04-05 LAB — TSH: TSH: 1.73 u[IU]/mL (ref 0.450–4.500)

## 2019-04-06 ENCOUNTER — Other Ambulatory Visit: Payer: Self-pay | Admitting: Family Medicine

## 2019-04-06 DIAGNOSIS — I10 Essential (primary) hypertension: Secondary | ICD-10-CM

## 2019-04-26 DIAGNOSIS — Z8546 Personal history of malignant neoplasm of prostate: Secondary | ICD-10-CM | POA: Diagnosis not present

## 2019-04-26 DIAGNOSIS — N5231 Erectile dysfunction following radical prostatectomy: Secondary | ICD-10-CM | POA: Diagnosis not present

## 2019-04-29 ENCOUNTER — Ambulatory Visit: Payer: Medicare HMO | Admitting: Family Medicine

## 2019-05-04 DIAGNOSIS — J338 Other polyp of sinus: Secondary | ICD-10-CM | POA: Diagnosis not present

## 2019-05-04 DIAGNOSIS — J310 Chronic rhinitis: Secondary | ICD-10-CM | POA: Diagnosis not present

## 2019-05-06 ENCOUNTER — Encounter: Payer: Self-pay | Admitting: Gastroenterology

## 2019-05-27 ENCOUNTER — Ambulatory Visit (INDEPENDENT_AMBULATORY_CARE_PROVIDER_SITE_OTHER): Payer: Medicare HMO | Admitting: Family Medicine

## 2019-05-27 ENCOUNTER — Other Ambulatory Visit: Payer: Self-pay | Admitting: Family Medicine

## 2019-05-27 ENCOUNTER — Encounter: Payer: Self-pay | Admitting: Family Medicine

## 2019-05-27 ENCOUNTER — Other Ambulatory Visit: Payer: Self-pay

## 2019-05-27 ENCOUNTER — Ambulatory Visit (INDEPENDENT_AMBULATORY_CARE_PROVIDER_SITE_OTHER): Payer: Medicare HMO

## 2019-05-27 VITALS — BP 107/70 | HR 96 | Temp 97.2°F | Ht 72.0 in | Wt 256.2 lb

## 2019-05-27 DIAGNOSIS — R319 Hematuria, unspecified: Secondary | ICD-10-CM

## 2019-05-27 DIAGNOSIS — R112 Nausea with vomiting, unspecified: Secondary | ICD-10-CM

## 2019-05-27 DIAGNOSIS — R0602 Shortness of breath: Secondary | ICD-10-CM | POA: Diagnosis not present

## 2019-05-27 DIAGNOSIS — R630 Anorexia: Secondary | ICD-10-CM

## 2019-05-27 DIAGNOSIS — R5383 Other fatigue: Secondary | ICD-10-CM | POA: Diagnosis not present

## 2019-05-27 DIAGNOSIS — R1031 Right lower quadrant pain: Secondary | ICD-10-CM

## 2019-05-27 DIAGNOSIS — R350 Frequency of micturition: Secondary | ICD-10-CM

## 2019-05-27 DIAGNOSIS — N390 Urinary tract infection, site not specified: Secondary | ICD-10-CM

## 2019-05-27 DIAGNOSIS — R739 Hyperglycemia, unspecified: Secondary | ICD-10-CM

## 2019-05-27 DIAGNOSIS — R432 Parageusia: Secondary | ICD-10-CM | POA: Diagnosis not present

## 2019-05-27 LAB — POCT CBC
Granulocyte percent: 85.8 %G — AB (ref 37–80)
HCT, POC: 43.5 % — AB (ref 29–41)
Hemoglobin: 14 g/dL (ref 11–14.6)
Lymph, poc: 1.2 (ref 0.6–3.4)
MCH, POC: 29.4 pg (ref 27–31.2)
MCHC: 32.2 g/dL (ref 31.8–35.4)
MCV: 91 fL (ref 76–111)
MID (cbc): 0.1 (ref 0–0.9)
MPV: 7.8 fL (ref 0–99.8)
POC Granulocyte: 8.1 — AB (ref 2–6.9)
POC LYMPH PERCENT: 13.1 %L (ref 10–50)
POC MID %: 1.1 %M (ref 0–12)
Platelet Count, POC: 285 10*3/uL (ref 142–424)
RBC: 4.77 M/uL (ref 4.69–6.13)
RDW, POC: 15.1 %
WBC: 9.4 10*3/uL (ref 4.6–10.2)

## 2019-05-27 LAB — POCT URINALYSIS DIP (MANUAL ENTRY)
Bilirubin, UA: NEGATIVE
Glucose, UA: NEGATIVE mg/dL
Ketones, POC UA: NEGATIVE mg/dL
Nitrite, UA: POSITIVE — AB
Protein Ur, POC: 30 mg/dL — AB
Spec Grav, UA: 1.02 (ref 1.010–1.025)
Urobilinogen, UA: 1 E.U./dL
pH, UA: 5 (ref 5.0–8.0)

## 2019-05-27 LAB — POCT GLYCOSYLATED HEMOGLOBIN (HGB A1C): Hemoglobin A1C: 6.9 % — AB (ref 4.0–5.6)

## 2019-05-27 LAB — POC MICROSCOPIC URINALYSIS (UMFC)

## 2019-05-27 LAB — GLUCOSE, POCT (MANUAL RESULT ENTRY): POC Glucose: 114 mg/dl — AB (ref 70–99)

## 2019-05-27 MED ORDER — CIPROFLOXACIN HCL 500 MG PO TABS: 500.0000 mg | ORAL_TABLET | Freq: Two times a day (BID) | ORAL | 0 refills | Status: DC

## 2019-05-27 NOTE — Patient Instructions (Addendum)
I do recommend reamining at home, avoid others until results of tests. Start antibiotic, drink plenty of fluids for urine infection. Go to the nearest emergency room if any of your symptoms worsen or new symptoms occur.   Urinary Tract Infection, Adult  A urinary tract infection (UTI) is an infection of any part of the urinary tract. The urinary tract includes the kidneys, ureters, bladder, and urethra. These organs make, store, and get rid of urine in the body. Your health care provider may use other names to describe the infection. An upper UTI affects the ureters and kidneys (pyelonephritis). A lower UTI affects the bladder (cystitis) and urethra (urethritis). What are the causes? Most urinary tract infections are caused by bacteria in your genital area, around the entrance to your urinary tract (urethra). These bacteria grow and cause inflammation of your urinary tract. What increases the risk? You are more likely to develop this condition if:  You have a urinary catheter that stays in place (indwelling).  You are not able to control when you urinate or have a bowel movement (you have incontinence).  You are male and you: ? Use a spermicide or diaphragm for birth control. ? Have low estrogen levels. ? Are pregnant.  You have certain genes that increase your risk (genetics).  You are sexually active.  You take antibiotic medicines.  You have a condition that causes your flow of urine to slow down, such as: ? An enlarged prostate, if you are male. ? Blockage in your urethra (stricture). ? A kidney stone. ? A nerve condition that affects your bladder control (neurogenic bladder). ? Not getting enough to drink, or not urinating often.  You have certain medical conditions, such as: ? Diabetes. ? A weak disease-fighting system (immunesystem). ? Sickle cell disease. ? Gout. ? Spinal cord injury. What are the signs or symptoms? Symptoms of this condition include:  Needing  to urinate right away (urgently).  Frequent urination or passing small amounts of urine frequently.  Pain or burning with urination.  Blood in the urine.  Urine that smells bad or unusual.  Trouble urinating.  Cloudy urine.  Vaginal discharge, if you are male.  Pain in the abdomen or the lower back. You may also have:  Vomiting or a decreased appetite.  Confusion.  Irritability or tiredness.  A fever.  Diarrhea. The first symptom in older adults may be confusion. In some cases, they may not have any symptoms until the infection has worsened. How is this diagnosed? This condition is diagnosed based on your medical history and a physical exam. You may also have other tests, including:  Urine tests.  Blood tests.  Tests for sexually transmitted infections (STIs). If you have had more than one UTI, a cystoscopy or imaging studies may be done to determine the cause of the infections. How is this treated? Treatment for this condition includes:  Antibiotic medicine.  Over-the-counter medicines to treat discomfort.  Drinking enough water to stay hydrated. If you have frequent infections or have other conditions such as a kidney stone, you may need to see a health care provider who specializes in the urinary tract (urologist). In rare cases, urinary tract infections can cause sepsis. Sepsis is a life-threatening condition that occurs when the body responds to an infection. Sepsis is treated in the hospital with IV antibiotics, fluids, and other medicines. Follow these instructions at home:  Medicines  Take over-the-counter and prescription medicines only as told by your health care provider.  If you  were prescribed an antibiotic medicine, take it as told by your health care provider. Do not stop using the antibiotic even if you start to feel better. General instructions  Make sure you: ? Empty your bladder often and completely. Do not hold urine for long periods of  time. ? Empty your bladder after sex. ? Wipe from front to back after a bowel movement if you are male. Use each tissue one time when you wipe.  Drink enough fluid to keep your urine pale yellow.  Keep all follow-up visits as told by your health care provider. This is important. Contact a health care provider if:  Your symptoms do not get better after 1-2 days.  Your symptoms go away and then return. Get help right away if you have:  Severe pain in your back or your lower abdomen.  A fever.  Nausea or vomiting. Summary  A urinary tract infection (UTI) is an infection of any part of the urinary tract, which includes the kidneys, ureters, bladder, and urethra.  Most urinary tract infections are caused by bacteria in your genital area, around the entrance to your urinary tract (urethra).  Treatment for this condition often includes antibiotic medicines.  If you were prescribed an antibiotic medicine, take it as told by your health care provider. Do not stop using the antibiotic even if you start to feel better.  Keep all follow-up visits as told by your health care provider. This is important. This information is not intended to replace advice given to you by your health care provider. Make sure you discuss any questions you have with your health care provider. Document Revised: 01/14/2018 Document Reviewed: 08/06/2017 Elsevier Patient Education  [employer].    If you have lab work done today you will be contacted with your lab results within the next 2 weeks.  If you have not heard from [place] then please contact us. The fastest way to get your results is to register for My Chart.   IF you received an x-ray today, you will receive an invoice from [facility]. Please contact [facility] at [phone] with questions or concerns regarding your invoice.   IF you received labwork today, you will receive an invoice from [place]. Please contact [facility] at  [phone] with questions or concerns regarding your invoice.   Our billing staff will not be able to assist you with questions regarding bills from these companies.  You will be contacted with the lab results as soon as they are available. The fastest way to get your results is to activate your My Chart account. Instructions are located on the last page of this paperwork. If you have not heard from [place] regarding the results in 2 weeks, please contact this office.

## 2019-05-27 NOTE — Progress Notes (Signed)
Subjective:  Patient ID: Jordan Hines, male    DOB: 25-May-1950  Age: 69 y.o. MRN: [id number]  CC:  Chief Complaint  Patient presents with  . Fatigue    has not had a drink in 3 wk, has appt for colonoscopy appt is 5/5    HPI NUNCIO RAHILL presents for   Fatigue Discussed 2/22 visit.  Thought to be possibly multifactorial deconditioning, alcohol overuse, but also with snoring differential included OSA.  Initial attempt at decreased alcohol abuse, labs obtained.  Normal TSH, CBC.  Glucose elevated at 113.  Otherwise CMP normal.  History of prediabetes with A1c 5.8 in May of last year. Since last visit has decreased alcohol use, no alcohol in past 3 weeks..  Does have an appointment for colonoscopy May 5.  Today presents with decreased appetite for the past 2 weeks, decreased sense of taste.  Has been feeling more fatigued, worse than last visit.  Denies change in smell.  No alcohol in 3 weeks as above.  Denies cough but has been short of breath during the past few weeks as well, no chest pain.  Previously vaccinated for COVID-19 with his second vaccine in February. Additionally has had frequent urination for the past 2 weeks with an odor.  No fever, no chills.  Slight abdominal discomfort in the lower abdomen without diarrhea.  Has been nausea the past 2 weeks with one episode of vomiting earlier this week.  History of prostate cancer with radical prostatectomy in 2011.  Lab Results  Component Value Date   HGBA1C 6.9 (A) 05/27/2019   History Patient Active Problem List   Diagnosis Date Noted  . Acute gouty arthritis 02/26/2018  . Non-allergic rhinitis 01/06/2018   Past Medical History:  Diagnosis Date  . Anxiety   . Arthritis   . Cancer ([place])   . Depression   . Deviated septum   . Hypertension   . Nasal turbinate hypertrophy    Past Surgical History:  Procedure Laterality Date  . COLONOSCOPY    . NASAL SEPTOPLASTY W/ TURBINOPLASTY Bilateral 10/19/2018   Procedure:  NASAL SEPTOPLASTY WITH  BILATERAL TURBINATE REDUCTION;  Surgeon: [name], MD;  Location: [facility];  Service: ENT;  Laterality: Bilateral;  . PROSTATE SURGERY     No Known Allergies Prior to Admission medications   Medication Sig Start Date End Date Taking? Authorizing Provider  allopurinol (ZYLOPRIM) 100 MG tablet Take 1 tablet (100 mg total) by mouth daily. 04/04/19  Yes [name], MD  fluticasone Asencion Islam) 50 MCG/ACT nasal spray  03/11/19  Yes [provider]  lisinopril (ZESTRIL) 10 MG tablet Take 1 tablet (10 mg total) by mouth daily. 04/04/19  Yes [name], MD  metoprolol succinate (TOPROL-XL) 25 MG 24 hr tablet Take 1 tablet (25 mg total) by mouth daily. 04/04/19  Yes [name], MD  neomycin-polymyxin-hydrocortisone (CORTISPORIN) OTIC solution Use 3 or 4 drops in the right ear twice daily as directed. 02/02/19  Yes [name], MD  sertraline (ZOLOFT) 50 MG tablet TAKE 1 TABLET EVERY DAY 04/04/19  Yes [name], MD  sildenafil (VIAGRA) 100 MG tablet Take 100 mg by mouth daily. 04/19/18  Yes [provider]  triamcinolone cream (KENALOG) 0.1 % Apply 1 application topically 2 (two) times daily. 02/02/19  Yes [name], MD  colchicine 0.6 MG tablet Take 1 tablet (0.6 mg total) by mouth daily. Patient not taking: Reported on 05/27/2019 04/04/19   Carlota Raspberry,  Ranell Patrick, MD   Social History   Socioeconomic History  . Marital status: Divorced    Spouse name: Not on file  . Number of children: 1  . Years of education: Not on file  . Highest education level: Some college, no degree  Occupational History  . Not on file  Tobacco Use  . Smoking status: Current Every Day Smoker    Types: Cigars  . Smokeless tobacco: Never Used  . Tobacco comment: about 7 small cigars daily  Substance and Sexual Activity  . Alcohol use: Yes    Alcohol/week: 7.0 standard drinks    Types: 7 Cans of beer per week    Comment: 1-3 shots /day  .  Drug use: No  . Sexual activity: Yes  Other Topics Concern  . Not on file  Social History Narrative  . Not on file   Social Determinants of Health   Financial Resource Strain:   . Difficulty of Paying Living Expenses:   Food Insecurity:   . Worried About Charity fundraiser in the Last Year:   . Arboriculturist in the Last Year:   Transportation Needs:   . Film/video editor (Medical):   Marland Kitchen Lack of Transportation (Non-Medical):   Physical Activity:   . Days of Exercise per Week:   . Minutes of Exercise per Session:   Stress:   . Feeling of Stress :   Social Connections:   . Frequency of Communication with Friends and Family:   . Frequency of Social Gatherings with Friends and Family:   . Attends Religious Services:   . Active Member of Clubs or Organizations:   . Attends Archivist Meetings:   Marland Kitchen Marital Status:   Intimate Partner Violence:   . Fear of Current or Ex-Partner:   . Emotionally Abused:   Marland Kitchen Physically Abused:   . Sexually Abused:     Review of Systems   Objective:   Vitals:   05/27/19 1136  BP: 107/70  Pulse: 96  Temp: (!) 97.2 F (36.2 C)  SpO2: 96%  Weight: 256 lb 3.2 oz (116.2 kg)  Height: 6' (1.829 m)     Physical Exam Vitals reviewed.  Constitutional:      Appearance: He is well-developed.  HENT:     Head: Normocephalic and atraumatic.  Eyes:     Pupils: Pupils are equal, round, and reactive to light.  Neck:     Vascular: No carotid bruit or JVD.  Cardiovascular:     Rate and Rhythm: Regular rhythm. Tachycardia present.     Heart sounds: Normal heart sounds. No murmur.     Comments: Borderline tachycardia 9200, no murmur. Pulmonary:     Effort: Pulmonary effort is normal. No respiratory distress.     Breath sounds: Normal breath sounds. No rhonchi or rales.  Abdominal:     General: There is no distension.     Tenderness: There is abdominal tenderness (Minimal right lower quadrant, negative Murphy's.  No  rebound/guarding.). There is no guarding or rebound.  Skin:    General: Skin is warm and dry.  Neurological:     Mental Status: He is alert and oriented to person, place, and time.    DG Chest 2 View  Result Date: 05/27/2019 CLINICAL DATA:  Shortness of breath. EXAM: CHEST - 2 VIEW COMPARISON:  August 20, 2018. FINDINGS: The heart size and mediastinal contours are within normal limits. Both lungs are clear. No pneumothorax or pleural effusion is  noted. The visualized skeletal structures are unremarkable. IMPRESSION: No active cardiopulmonary disease. Electronically Signed   By: Marijo Conception M.D.   On: 05/27/2019 12:34   EKG sinus rhythm.  No apparent acute ST or T wave changes.   Results for orders placed or performed in visit on 05/27/19  Lipase  Result Value Ref Range   Lipase 32 13 - 78 U/L  POCT glucose (manual entry)  Result Value Ref Range   POC Glucose 114 (A) 70 - 99 mg/dl  POCT glycosylated hemoglobin (Hb A1C)  Result Value Ref Range   Hemoglobin A1C 6.9 (A) 4.0 - 5.6 %   HbA1c POC (<> result, manual entry)     HbA1c, POC (prediabetic range)     HbA1c, POC (controlled diabetic range)    POCT urinalysis dipstick  Result Value Ref Range   Color, UA yellow yellow   Clarity, UA turbid (A) clear   Glucose, UA negative negative mg/dL   Bilirubin, UA negative negative   Ketones, POC UA negative negative mg/dL   Spec Grav, UA 1.020 1.010 - 1.025   Blood, UA moderate (A) negative   pH, UA 5.0 5.0 - 8.0   Protein Ur, POC =30 (A) negative mg/dL   Urobilinogen, UA 1.0 0.2 or 1.0 E.U./dL   Nitrite, UA Positive (A) Negative   Leukocytes, UA Large (3+) (A) Negative  POCT Microscopic Urinalysis ([facility])  Result Value Ref Range   WBC,UR,HPF,POC Many (A) None WBC/hpf   RBC,UR,HPF,POC Few (A) None RBC/hpf   Bacteria Too numerous to count  None, Too numerous to count   Mucus Present (A) Absent   Epithelial Cells, UR Per Microscopy Few (A) None, Too numerous to count cells/hpf    POCT CBC  Result Value Ref Range   WBC 9.4 4.6 - 10.2 K/uL   Lymph, poc 1.2 0.6 - 3.4   POC LYMPH PERCENT 13.1 10 - 50 %L   MID (cbc) 0.1 0 - 0.9   POC MID % 1.1 0 - 12 %M   POC Granulocyte 8.1 (A) 2 - 6.9   Granulocyte percent 85.8 (A) 37 - 80 %G   RBC 4.77 4.69 - 6.13 M/uL   Hemoglobin 14.0 11 - 14.6 g/dL   HCT, POC 43.5 (A) 29 - 41 %   MCV 91.0 76 - 111 fL   MCH, POC 29.4 27 - 31.2 pg   MCHC 32.2 31.8 - 35.4 g/dL   RDW, POC 15.1 %   Platelet Count, POC 285 142 - 424 K/uL   MPV 7.8 0 - 99.8 fL    Assessment & Plan:  LOC WROBLESKI is a 69 y.o. male . Fatigue, unspecified type - Plan: POCT CBC, EKG 12-Lead  Nausea and vomiting, intractability of vomiting not specified, unspecified vomiting type - Plan: Lipase  Urinary frequency - Plan: POCT urinalysis dipstick, POCT Microscopic Urinalysis ([facility])  Decreased appetite - Plan: Novel Coronavirus, NAA ([facility])  RLQ abdominal pain - Plan: POCT CBC  Hyperglycemia - Plan: POCT glucose (manual entry), POCT glycosylated hemoglobin (Hb A1C)  Shortness of breath - Plan: EKG 12-Lead, DG Chest 2 View, Novel Coronavirus, NAA ([facility])  Decreased sense of taste - Plan: Novel Coronavirus, NAA ([facility])  Urinary tract infection with hematuria, site unspecified - Plan: Urine Culture, ciprofloxacin (CIPRO) 500 MG tablet  Urinalysis and symptoms suspicious for urinary tract infection, afebrile, normal WBC, doubt sepsis.  Initial outpatient treatment plan with Cipro, potential tendinopathy risk discussed, but complicated given symptoms above.  Check  urine culture, push fluids, ER precautions given over the weekend.  No concerning findings on EKG, chest x-ray with shortness of breath.  Will check COVID-19 testing although less likely status post vaccination.  Recommended quarantine for now until results known.  ER precautions were given for nausea/vomiting/abdominal pain as may need further imaging.  Recheck 3 days  Meds ordered this  encounter  Medications  . ciprofloxacin (CIPRO) 500 MG tablet    Sig: Take 1 tablet (500 mg total) by mouth 2 (two) times daily.    Dispense:  14 tablet    Refill:  0   Patient Instructions    I do recommend reamining at home, avoid others until results of tests. Start antibiotic, drink plenty of fluids for urine infection. Go to the nearest emergency room if any of your symptoms worsen or new symptoms occur.   Urinary Tract Infection, Adult  A urinary tract infection (UTI) is an infection of any part of the urinary tract. The urinary tract includes the kidneys, ureters, bladder, and urethra. These organs make, store, and get rid of urine in the body. Your health care provider may use other names to describe the infection. An upper UTI affects the ureters and kidneys (pyelonephritis). A lower UTI affects the bladder (cystitis) and urethra (urethritis). What are the causes? Most urinary tract infections are caused by bacteria in your genital area, around the entrance to your urinary tract (urethra). These bacteria grow and cause inflammation of your urinary tract. What increases the risk? You are more likely to develop this condition if:  You have a urinary catheter that stays in place (indwelling).  You are not able to control when you urinate or have a bowel movement (you have incontinence).  You are male and you: ? Use a spermicide or diaphragm for birth control. ? Have low estrogen levels. ? Are pregnant.  You have certain genes that increase your risk (genetics).  You are sexually active.  You take antibiotic medicines.  You have a condition that causes your flow of urine to slow down, such as: ? An enlarged prostate, if you are male. ? Blockage in your urethra (stricture). ? A kidney stone. ? A nerve condition that affects your bladder control (neurogenic bladder). ? Not getting enough to drink, or not urinating often.  You have certain medical conditions, such  as: ? Diabetes. ? A weak disease-fighting system (immunesystem). ? Sickle cell disease. ? Gout. ? Spinal cord injury. What are the signs or symptoms? Symptoms of this condition include:  Needing to urinate right away (urgently).  Frequent urination or passing small amounts of urine frequently.  Pain or burning with urination.  Blood in the urine.  Urine that smells bad or unusual.  Trouble urinating.  Cloudy urine.  Vaginal discharge, if you are male.  Pain in the abdomen or the lower back. You may also have:  Vomiting or a decreased appetite.  Confusion.  Irritability or tiredness.  A fever.  Diarrhea. The first symptom in older adults may be confusion. In some cases, they may not have any symptoms until the infection has worsened. How is this diagnosed? This condition is diagnosed based on your medical history and a physical exam. You may also have other tests, including:  Urine tests.  Blood tests.  Tests for sexually transmitted infections (STIs). If you have had more than one UTI, a cystoscopy or imaging studies may be done to determine the cause of the infections. How is this treated? Treatment for  this condition includes:  Antibiotic medicine.  Over-the-counter medicines to treat discomfort.  Drinking enough water to stay hydrated. If you have frequent infections or have other conditions such as a kidney stone, you may need to see a health care provider who specializes in the urinary tract (urologist). In rare cases, urinary tract infections can cause sepsis. Sepsis is a life-threatening condition that occurs when the body responds to an infection. Sepsis is treated in the hospital with IV antibiotics, fluids, and other medicines. Follow these instructions at home:  Medicines  Take over-the-counter and prescription medicines only as told by your health care provider.  If you were prescribed an antibiotic medicine, take it as told by your health  care provider. Do not stop using the antibiotic even if you start to feel better. General instructions  Make sure you: ? Empty your bladder often and completely. Do not hold urine for long periods of time. ? Empty your bladder after sex. ? Wipe from front to back after a bowel movement if you are male. Use each tissue one time when you wipe.  Drink enough fluid to keep your urine pale yellow.  Keep all follow-up visits as told by your health care provider. This is important. Contact a health care provider if:  Your symptoms do not get better after 1-2 days.  Your symptoms go away and then return. Get help right away if you have:  Severe pain in your back or your lower abdomen.  A fever.  Nausea or vomiting. Summary  A urinary tract infection (UTI) is an infection of any part of the urinary tract, which includes the kidneys, ureters, bladder, and urethra.  Most urinary tract infections are caused by bacteria in your genital area, around the entrance to your urinary tract (urethra).  Treatment for this condition often includes antibiotic medicines.  If you were prescribed an antibiotic medicine, take it as told by your health care provider. Do not stop using the antibiotic even if you start to feel better.  Keep all follow-up visits as told by your health care provider. This is important. This information is not intended to replace advice given to you by your health care provider. Make sure you discuss any questions you have with your health care provider. Document Revised: 01/14/2018 Document Reviewed: 08/06/2017 Elsevier Patient Education  [employer].    If you have lab work done today you will be contacted with your lab results within the next 2 weeks.  If you have not heard from [place] then please contact us. The fastest way to get your results is to register for My Chart.   IF you received an x-ray today, you will receive an invoice from [facility].  Please contact [facility] at [phone] with questions or concerns regarding your invoice.   IF you received labwork today, you will receive an invoice from [facility]. Please contact [facility] at [phone] with questions or concerns regarding your invoice.   Our billing staff will not be able to assist you with questions regarding bills from these companies.  You will be contacted with the lab results as soon as they are available. The fastest way to get your results is to activate your My Chart account. Instructions are located on the last page of this paperwork. If you have not heard from [place] regarding the results in 2 weeks, please contact this office.         Signed, Merri Ray, MD [facility]

## 2019-05-28 LAB — LIPASE: Lipase: 32 U/L (ref 13–78)

## 2019-05-28 LAB — SARS-COV-2, NAA 2 DAY TAT

## 2019-05-28 LAB — NOVEL CORONAVIRUS, NAA: SARS-CoV-2, NAA: NOT DETECTED

## 2019-05-30 ENCOUNTER — Telehealth (INDEPENDENT_AMBULATORY_CARE_PROVIDER_SITE_OTHER): Payer: Medicare HMO | Admitting: Family Medicine

## 2019-05-30 ENCOUNTER — Encounter: Payer: Self-pay | Admitting: Family Medicine

## 2019-05-30 ENCOUNTER — Other Ambulatory Visit: Payer: Self-pay

## 2019-05-30 DIAGNOSIS — N390 Urinary tract infection, site not specified: Secondary | ICD-10-CM

## 2019-05-30 DIAGNOSIS — R319 Hematuria, unspecified: Secondary | ICD-10-CM | POA: Diagnosis not present

## 2019-05-30 DIAGNOSIS — R5383 Other fatigue: Secondary | ICD-10-CM | POA: Diagnosis not present

## 2019-05-30 LAB — URINE CULTURE

## 2019-05-30 NOTE — Patient Instructions (Addendum)
Continue ciprofloxacin, continue to drink plenty of fluids.  Follow-up in person in 1 week, sooner if any worsening symptoms prior to that time.  Return to the clinic or go to the nearest emergency room if any of your symptoms worsen or new symptoms occur.

## 2019-05-30 NOTE — Progress Notes (Signed)
Virtual Visit via Video Note  I connected with Jordan Hines on 05/30/19 at 10:31 PM by a video enabled telemedicine application and verified that I am speaking with the correct person using two identifiers.   I discussed the limitations, risks, security and privacy concerns of performing an evaluation and management service by telephone and the availability of in person appointments. I also discussed with the patient that there may be a patient responsible charge related to this service. The patient expressed understanding and agreed to proceed, consent obtained  Chief complaint:  Chief Complaint  Patient presents with  . Follow-up    3 day follow up for fatigue and uti. Feels the uti is getting better, still has the urine odor but not as strong. Drinking plenty of fluid as suggested juice and water namely. Appetite is improving, Fatigue not as bad as before, Still taking the cipro. He did stay inside as suggested however he does have plans for tomorrow     History of Present Illness: Jordan Hines is a 69 y.o. male   Fatigue, UTI: Follow up from 4/16 visit.  Multiple symptoms in that time, 2-week history of fatigue, urinary frequency, was also having some cough, shortness of breath.  Previously been vaccinated with COVID-19 vaccine, most recent in February.  Ultimately diagnosed with urinary tract infection, complicated by other symptoms.  History of radical prostatectomy in 2011.  He was started on Cipro 500 mg twice daily with tendinopathy precautions discussed.  Preliminary urine culture positive for E. coli greater than 100,000, pansensitive. COVID-19 testing negative, normal lipase.  Since last visit - taking cipro twice per day. Feeling better than past few days.  Eating and drinking today, appetite is improving and drinking plenty of water.  No fever, no vomiting. No diarrhea. No further abdominal  pain. Urinary frequency has improved but still some frequency.  Only min  dysuria, no hematuria.  No side effects with cipro.   Shortness of breath better, but less exertion.    Patient Active Problem List   Diagnosis Date Noted  . Acute gouty arthritis 02/26/2018  . Non-allergic rhinitis 01/06/2018   Past Medical History:  Diagnosis Date  . Anxiety   . Arthritis   . Cancer ([place])   . Depression   . Deviated septum   . Hypertension   . Nasal turbinate hypertrophy    Past Surgical History:  Procedure Laterality Date  . COLONOSCOPY    . NASAL SEPTOPLASTY W/ TURBINOPLASTY Bilateral 10/19/2018   Procedure: NASAL SEPTOPLASTY WITH  BILATERAL TURBINATE REDUCTION;  Surgeon: [name], MD;  Location: [facility];  Service: ENT;  Laterality: Bilateral;  . PROSTATE SURGERY     No Known Allergies Prior to Admission medications   Medication Sig Start Date End Date Taking? Authorizing Provider  allopurinol (ZYLOPRIM) 100 MG tablet Take 1 tablet (100 mg total) by mouth daily. 04/04/19   [name], MD  ciprofloxacin (CIPRO) 500 MG tablet Take 1 tablet (500 mg total) by mouth 2 (two) times daily. 05/27/19   [name], MD  colchicine 0.6 MG tablet Take 1 tablet (0.6 mg total) by mouth daily. Patient not taking: Reported on 05/27/2019 04/04/19   [name], MD  fluticasone Asencion Islam) 50 MCG/ACT nasal spray  03/11/19   [provider]  lisinopril (ZESTRIL) 10 MG tablet Take 1 tablet (10 mg total) by mouth daily. 04/04/19   [name], MD  metoprolol succinate (TOPROL-XL) 25 MG 24 hr tablet Take  1 tablet (25 mg total) by mouth daily. 04/04/19   [name], MD  neomycin-polymyxin-hydrocortisone (CORTISPORIN) OTIC solution Use 3 or 4 drops in the right ear twice daily as directed. 02/02/19   [name], MD  sertraline (ZOLOFT) 50 MG tablet TAKE 1 TABLET EVERY DAY 04/04/19   [name], MD  sildenafil (VIAGRA) 100 MG tablet Take 100 mg by mouth daily. 04/19/18   [provider]  triamcinolone cream (KENALOG)  0.1 % Apply 1 application topically 2 (two) times daily. 02/02/19   [name], MD   Social History   Socioeconomic History  . Marital status: Divorced    Spouse name: Not on file  . Number of children: 1  . Years of education: Not on file  . Highest education level: Some college, no degree  Occupational History  . Not on file  Tobacco Use  . Smoking status: Current Every Day Smoker    Types: Cigars  . Smokeless tobacco: Never Used  . Tobacco comment: about 7 small cigars daily  Substance and Sexual Activity  . Alcohol use: Yes    Alcohol/week: 7.0 standard drinks    Types: 7 Cans of beer per week    Comment: 1-3 shots /day  . Drug use: No  . Sexual activity: Yes  Other Topics Concern  . Not on file  Social History Narrative  . Not on file   Social Determinants of Health   Financial Resource Strain:   . Difficulty of Paying Living Expenses:   Food Insecurity:   . Worried About Charity fundraiser in the Last Year:   . Arboriculturist in the Last Year:   Transportation Needs:   . Film/video editor (Medical):   Marland Kitchen Lack of Transportation (Non-Medical):   Physical Activity:   . Days of Exercise per Week:   . Minutes of Exercise per Session:   Stress:   . Feeling of Stress :   Social Connections:   . Frequency of Communication with Friends and Family:   . Frequency of Social Gatherings with Friends and Family:   . Attends Religious Services:   . Active Member of Clubs or Organizations:   . Attends Archivist Meetings:   Marland Kitchen Marital Status:   Intimate Partner Violence:   . Fear of Current or Ex-Partner:   . Emotionally Abused:   Marland Kitchen Physically Abused:   . Sexually Abused:     Observations/Objective: There were no vitals filed for this visit. Nontoxic-appearing on video, appropriate responses, all questions answered with understanding expressed.  Assessment and Plan: Fatigue, unspecified type  Urinary tract infection with hematuria, site  unspecified as above, improving with use of Cipro. afebrile, tolerating p.o. fluids, appetite improving, previous dyspnea and fatigue improving.  Recheck 1 week, sooner if worsening symptoms.   Follow Up Instructions: 1 week   I discussed the assessment and treatment plan with the patient. The patient was provided an opportunity to ask questions and all were answered. The patient agreed with the plan and demonstrated an understanding of the instructions.   The patient was advised to call back or seek an in-person evaluation if the symptoms worsen or if the condition fails to improve as anticipated.  I provided 13 minutes of non-face-to-face time during this encounter.   [name], MD

## 2019-06-01 DIAGNOSIS — J310 Chronic rhinitis: Secondary | ICD-10-CM | POA: Diagnosis not present

## 2019-06-01 DIAGNOSIS — J338 Other polyp of sinus: Secondary | ICD-10-CM | POA: Diagnosis not present

## 2019-06-02 ENCOUNTER — Telehealth: Payer: Self-pay | Admitting: Family Medicine

## 2019-06-02 NOTE — Telephone Encounter (Signed)
Pt called his said his so the Result of his test on my [facility] don,t know what they said can some one call him Please Advice.

## 2019-06-08 NOTE — Progress Notes (Signed)
PT RETURNING ON 06/10/19

## 2019-06-10 ENCOUNTER — Encounter: Payer: Self-pay | Admitting: Family Medicine

## 2019-06-10 ENCOUNTER — Ambulatory Visit (INDEPENDENT_AMBULATORY_CARE_PROVIDER_SITE_OTHER): Payer: Medicare HMO | Admitting: Family Medicine

## 2019-06-10 ENCOUNTER — Other Ambulatory Visit: Payer: Self-pay

## 2019-06-10 VITALS — BP 122/78 | HR 89 | Temp 98.2°F | Ht 72.0 in | Wt 263.0 lb

## 2019-06-10 DIAGNOSIS — R319 Hematuria, unspecified: Secondary | ICD-10-CM

## 2019-06-10 DIAGNOSIS — F329 Major depressive disorder, single episode, unspecified: Secondary | ICD-10-CM

## 2019-06-10 DIAGNOSIS — M109 Gout, unspecified: Secondary | ICD-10-CM | POA: Diagnosis not present

## 2019-06-10 DIAGNOSIS — E1169 Type 2 diabetes mellitus with other specified complication: Secondary | ICD-10-CM | POA: Diagnosis not present

## 2019-06-10 DIAGNOSIS — N390 Urinary tract infection, site not specified: Secondary | ICD-10-CM

## 2019-06-10 DIAGNOSIS — E669 Obesity, unspecified: Secondary | ICD-10-CM | POA: Diagnosis not present

## 2019-06-10 DIAGNOSIS — I10 Essential (primary) hypertension: Secondary | ICD-10-CM

## 2019-06-10 DIAGNOSIS — F32A Depression, unspecified: Secondary | ICD-10-CM

## 2019-06-10 DIAGNOSIS — Z6835 Body mass index (BMI) 35.0-35.9, adult: Secondary | ICD-10-CM | POA: Diagnosis not present

## 2019-06-10 LAB — POCT URINALYSIS DIP (MANUAL ENTRY)
Bilirubin, UA: NEGATIVE
Glucose, UA: NEGATIVE mg/dL
Ketones, POC UA: NEGATIVE mg/dL
Nitrite, UA: NEGATIVE
Protein Ur, POC: NEGATIVE mg/dL
Spec Grav, UA: 1.015 (ref 1.010–1.025)
Urobilinogen, UA: 0.2 E.U./dL
pH, UA: 6 (ref 5.0–8.0)

## 2019-06-10 LAB — POC MICROSCOPIC URINALYSIS (UMFC): Mucus: ABSENT

## 2019-06-10 MED ORDER — COLCHICINE 0.6 MG PO TABS: 0.6000 mg | ORAL_TABLET | Freq: Every day | ORAL | 1 refills | Status: DC

## 2019-06-10 MED ORDER — SERTRALINE HCL 50 MG PO TABS: ORAL_TABLET | ORAL | 2 refills | Status: DC

## 2019-06-10 MED ORDER — METOPROLOL SUCCINATE ER 25 MG PO TB24: 25.0000 mg | ORAL_TABLET | Freq: Every day | ORAL | 1 refills | Status: DC

## 2019-06-10 MED ORDER — ALLOPURINOL 100 MG PO TABS: 100.0000 mg | ORAL_TABLET | Freq: Every day | ORAL | 1 refills | Status: DC

## 2019-06-10 MED ORDER — LISINOPRIL 10 MG PO TABS: 10.0000 mg | ORAL_TABLET | Freq: Every day | ORAL | 1 refills | Status: DC

## 2019-06-10 NOTE — Progress Notes (Signed)
Subjective:  Patient ID: Jordan Hines, male    DOB: April 30, 1950  Age: 69 y.o. MRN: [id number]  CC:  Chief Complaint  Patient presents with  . Follow-up    on Fatigue, low appitite, and possible UTI. pt states he is still a little fatigued but not as bad as during his appt on 05/30/2019. pt also states he has a better appitite since that visit. pt states pt states his urinary symptoms have also gotten better.  . Medication Refill    on lisinopril,Zoloft,Allopurinol,, and metroprolol. pt states these medications are working well with no side effects. pt still has these medications he would like refills sent in to [facility] his perfered pharmacy.    HPI [name] presents for   Fatigue/urinary tract infection: Initially discussed April 16.  2-week history of fatigue at that time.  Covid testing negative, normal lipase, hemoglobin, WBCs with slight left shift.  Noted urinary frequency.  Treated for urinary tract infection with Cipro 500 mg twice daily, urine culture positive for E. coli pansensitive.  Improving at telemedicine follow-up April 19. Chronic medications discussed at February visit.  Still some frequent urination, but not as bad. No fever. Fatigue has improved. only minimal.  No hematuria. No fever No abd pain/n/v.  Off abx past week. Saw urology about a month ago. Hx of prostatectomy for prostate CA in 2011. Had PSA, said level was good - recheck 1 year.   Lab Results  Component Value Date   PSA1 0.3 02/07/2017   PSA 0.19 05/11/2013   Needs meds sent to [facility].   Hyperglycemia: historry of prediabetes. A1c elevated at 4/16 visit. Now at diabetes level. Not much exercise.  Some soda and sweet tea.  No n/v/blurry vision.  Lab Results  Component Value Date   HGBA1C 6.9 (A) 05/27/2019      History Patient Active Problem List   Diagnosis Date Noted  . Acute gouty arthritis 02/26/2018  . Non-allergic rhinitis 01/06/2018   Past Medical History:    Diagnosis Date  . Anxiety   . Arthritis   . Cancer ([place])   . Depression   . Deviated septum   . Hypertension   . Nasal turbinate hypertrophy    Past Surgical History:  Procedure Laterality Date  . COLONOSCOPY    . NASAL SEPTOPLASTY W/ TURBINOPLASTY Bilateral 10/19/2018   Procedure: NASAL SEPTOPLASTY WITH  BILATERAL TURBINATE REDUCTION;  Surgeon: [name], MD;  Location: [facility];  Service: ENT;  Laterality: Bilateral;  . PROSTATE SURGERY     No Known Allergies Prior to Admission medications   Medication Sig Start Date End Date Taking? Authorizing Provider  allopurinol (ZYLOPRIM) 100 MG tablet Take 1 tablet (100 mg total) by mouth daily. 04/04/19  Yes [name], MD  ciprofloxacin (CIPRO) 500 MG tablet Take 1 tablet (500 mg total) by mouth 2 (two) times daily. 05/27/19  Yes [name], MD  colchicine 0.6 MG tablet Take 1 tablet (0.6 mg total) by mouth daily. 04/04/19  Yes [name], MD  fluticasone Asencion Islam) 50 MCG/ACT nasal spray  03/11/19  Yes [provider]  lisinopril (ZESTRIL) 10 MG tablet Take 1 tablet (10 mg total) by mouth daily. 04/04/19  Yes [name], MD  metoprolol succinate (TOPROL-XL) 25 MG 24 hr tablet Take 1 tablet (25 mg total) by mouth daily. 04/04/19  Yes [name], MD  neomycin-polymyxin-hydrocortisone (CORTISPORIN) OTIC solution Use 3 or 4 drops in the right ear  twice daily as directed. 02/02/19  Yes [name], MD  sertraline (ZOLOFT) 50 MG tablet TAKE 1 TABLET EVERY DAY 04/04/19  Yes [name], MD  sildenafil (VIAGRA) 100 MG tablet Take 100 mg by mouth daily. 04/19/18  Yes [provider]  triamcinolone cream (KENALOG) 0.1 % Apply 1 application topically 2 (two) times daily. 02/02/19  Yes [name], MD   Social History   Socioeconomic History  . Marital status: Divorced    Spouse name: Not on file  . Number of children: 1  . Years of education: Not on file  . Highest  education level: Some college, no degree  Occupational History  . Not on file  Tobacco Use  . Smoking status: Current Every Day Smoker    Types: Cigars  . Smokeless tobacco: Never Used  . Tobacco comment: about 7 small cigars daily  Substance and Sexual Activity  . Alcohol use: Yes    Alcohol/week: 7.0 standard drinks    Types: 7 Cans of beer per week    Comment: 1-3 shots /day  . Drug use: No  . Sexual activity: Yes  Other Topics Concern  . Not on file  Social History Narrative  . Not on file   Social Determinants of Health   Financial Resource Strain:   . Difficulty of Paying Living Expenses:   Food Insecurity:   . Worried About Charity fundraiser in the Last Year:   . Arboriculturist in the Last Year:   Transportation Needs:   . Film/video editor (Medical):   Marland Kitchen Lack of Transportation (Non-Medical):   Physical Activity:   . Days of Exercise per Week:   . Minutes of Exercise per Session:   Stress:   . Feeling of Stress :   Social Connections:   . Frequency of Communication with Friends and Family:   . Frequency of Social Gatherings with Friends and Family:   . Attends Religious Services:   . Active Member of Clubs or Organizations:   . Attends Archivist Meetings:   Marland Kitchen Marital Status:   Intimate Partner Violence:   . Fear of Current or Ex-Partner:   . Emotionally Abused:   Marland Kitchen Physically Abused:   . Sexually Abused:     Review of Systems  Constitutional: Negative for fever.  Gastrointestinal: Negative for abdominal pain, nausea and vomiting.  Genitourinary: Positive for frequency. Negative for decreased urine volume, difficulty urinating, dysuria and hematuria.    Objective:   Vitals:   06/10/19 1322  BP: 122/78  Pulse: 89  Temp: 98.2 F (36.8 C)  TempSrc: Temporal  SpO2: 97%  Weight: 263 lb (119.3 kg)  Height: 6' (1.829 m)    Physical Exam Vitals reviewed.  Constitutional:      General: He is not in acute distress.    Appearance:  He is well-developed. He is obese. He is not ill-appearing, toxic-appearing or diaphoretic.  HENT:     Head: Normocephalic and atraumatic.  Eyes:     Pupils: Pupils are equal, round, and reactive to light.  Neck:     Vascular: No carotid bruit or JVD.  Cardiovascular:     Rate and Rhythm: Normal rate and regular rhythm.     Heart sounds: Normal heart sounds. No murmur.  Pulmonary:     Effort: Pulmonary effort is normal.     Breath sounds: Normal breath sounds. No rales.  Abdominal:     Tenderness: There is no abdominal  tenderness. There is no right CVA tenderness or left CVA tenderness.  Skin:    General: Skin is warm and dry.  Neurological:     Mental Status: He is alert and oriented to person, place, and time.    Results for orders placed or performed in visit on 06/10/19  POCT urinalysis dipstick  Result Value Ref Range   Color, UA yellow yellow   Clarity, UA clear clear   Glucose, UA negative negative mg/dL   Bilirubin, UA negative negative   Ketones, POC UA negative negative mg/dL   Spec Grav, UA 1.015 1.010 - 1.025   Blood, UA trace-intact (A) negative   pH, UA 6.0 5.0 - 8.0   Protein Ur, POC negative negative mg/dL   Urobilinogen, UA 0.2 0.2 or 1.0 E.U./dL   Nitrite, UA Negative Negative   Leukocytes, UA Small (1+) (A) Negative  POCT Microscopic Urinalysis ([facility])  Result Value Ref Range   WBC,UR,HPF,POC Few (A) None WBC/hpf   RBC,UR,HPF,POC None None RBC/hpf   Bacteria None None, Too numerous to count   Mucus Absent Absent   Epithelial Cells, UR Per Microscopy Few (A) None, Too numerous to count cells/hpf     Assessment & Plan:  KIYAN MCCLAVE is a 69 y.o. male . Urinary tract infection with hematuria, site unspecified - Plan: POCT urinalysis dipstick, POCT Microscopic Urinalysis ([facility])  -Improved, repeat urinalysis with few wbc.. check culture - may need re[peat abx.  Has urologist if needed for follow-up.  Some frequent urination may be due to  elevated  blood sugar.  See information below.  RTC precautions  Depression, unspecified depression type - Plan: sertraline (ZOLOFT) 50 MG tablet Gout, unspecified cause, unspecified chronicity, unspecified site - Plan: colchicine 0.6 MG tablet, allopurinol (ZYLOPRIM) 100 MG tablet Essential hypertension - Plan: lisinopril (ZESTRIL) 10 MG tablet, metoprolol succinate (TOPROL-XL) 25 MG 24 hr tablet  -Above conditions discussed in February, refills ordered for mail order pharmacy.  Type 2 diabetes mellitus with obesity (HCC) Class 2 severe obesity with serious comorbidity and body mass index (BMI) of 35.0 to 35.9 in adult, unspecified obesity type ([place])  -Previous prediabetes, unfortunately A1c at diabetic level last visit.  Importance of diet/nutrition discussed with handout given.  Low intensity exercise such as walking with recheck levels in 3 months.  Meds ordered this encounter  Medications  . sertraline (ZOLOFT) 50 MG tablet    Sig: TAKE 1 TABLET EVERY DAY    Dispense:  90 tablet    Refill:  2  . colchicine 0.6 MG tablet    Sig: Take 1 tablet (0.6 mg total) by mouth daily.    Dispense:  30 tablet    Refill:  1  . lisinopril (ZESTRIL) 10 MG tablet    Sig: Take 1 tablet (10 mg total) by mouth daily.    Dispense:  90 tablet    Refill:  1  . allopurinol (ZYLOPRIM) 100 MG tablet    Sig: Take 1 tablet (100 mg total) by mouth daily.    Dispense:  90 tablet    Refill:  1  . metoprolol succinate (TOPROL-XL) 25 MG 24 hr tablet    Sig: Take 1 tablet (25 mg total) by mouth daily.    Dispense:  90 tablet    Refill:  1   Patient Instructions     Water is best, cut back on soda/sweet tea. Low intensity exercise like walking can help with weight loss.  Blood sugar at diabetes level last visit.  Recheck in mid July.   Frequent urination may improve with better blood sugar control.  I will check some urine test again today, but if any continued difficulty with frequent urination or any worsening  symptoms, return for recheck.  Return to the clinic or go to the nearest emergency room if any of your symptoms worsen or new symptoms occur.   Diabetes Mellitus and Nutrition, Adult When you have diabetes (diabetes mellitus), it is very important to have healthy eating habits because your blood sugar (glucose) levels are greatly affected by what you eat and drink. Eating healthy foods in the appropriate amounts, at about the same times every day, can help you:  Control your blood glucose.  Lower your risk of heart disease.  Improve your blood pressure.  Reach or maintain a healthy weight. Every person with diabetes is different, and each person has different needs for a meal plan. Your health care provider may recommend that you work with a diet and nutrition specialist (dietitian) to make a meal plan that is best for you. Your meal plan may vary depending on factors such as:  The calories you need.  The medicines you take.  Your weight.  Your blood glucose, blood pressure, and cholesterol levels.  Your activity level.  Other health conditions you have, such as heart or kidney disease. How do carbohydrates affect me? Carbohydrates, also called carbs, affect your blood glucose level more than any other type of food. Eating carbs naturally raises the amount of glucose in your blood. Carb counting is a method for keeping track of how many carbs you eat. Counting carbs is important to keep your blood glucose at a healthy level, especially if you use insulin or take certain oral diabetes medicines. It is important to know how many carbs you can safely have in each meal. This is different for every person. Your dietitian can help you calculate how many carbs you should have at each meal and for each snack. Foods that contain carbs include:  Bread, cereal, rice, pasta, and crackers.  Potatoes and corn.  Peas, beans, and lentils.  Milk and yogurt.  Fruit and juice.  Desserts, such  as cakes, cookies, ice cream, and candy. How does alcohol affect me? Alcohol can cause a sudden decrease in blood glucose (hypoglycemia), especially if you use insulin or take certain oral diabetes medicines. Hypoglycemia can be a life-threatening condition. Symptoms of hypoglycemia (sleepiness, dizziness, and confusion) are similar to symptoms of having too much alcohol. If your health care provider says that alcohol is safe for you, follow these guidelines:  Limit alcohol intake to no more than 1 drink per day for nonpregnant women and 2 drinks per day for men. One drink equals 12 oz of beer, 5 oz of wine, or 1 oz of hard liquor.  Do not drink on an empty stomach.  Keep yourself hydrated with water, diet soda, or unsweetened iced tea.  Keep in mind that regular soda, juice, and other mixers may contain a lot of sugar and must be counted as carbs. What are tips for following this plan?  Reading food labels  Start by checking the serving size on the "Nutrition Facts" label of packaged foods and drinks. The amount of calories, carbs, fats, and other nutrients listed on the label is based on one serving of the item. Many items contain more than one serving per package.  Check the total grams (g) of carbs in one serving. You can calculate the number of  servings of carbs in one serving by dividing the total carbs by 15. For example, if a food has 30 g of total carbs, it would be equal to 2 servings of carbs.  Check the number of grams (g) of saturated and trans fats in one serving. Choose foods that have low or no amount of these fats.  Check the number of milligrams (mg) of salt (sodium) in one serving. Most people should limit total sodium intake to less than 2,300 mg per day.  Always check the nutrition information of foods labeled as "low-fat" or "nonfat". These foods may be higher in added sugar or refined carbs and should be avoided.  Talk to your dietitian to identify your daily goals  for nutrients listed on the label. Shopping  Avoid buying canned, premade, or processed foods. These foods tend to be high in fat, sodium, and added sugar.  Shop around the outside edge of the grocery store. This includes fresh fruits and vegetables, bulk grains, fresh meats, and fresh dairy. Cooking  Use low-heat cooking methods, such as baking, instead of high-heat cooking methods like deep frying.  Cook using healthy oils, such as olive, canola, or sunflower oil.  Avoid cooking with butter, cream, or high-fat meats. Meal planning  Eat meals and snacks regularly, preferably at the same times every day. Avoid going long periods of time without eating.  Eat foods high in fiber, such as fresh fruits, vegetables, beans, and whole grains. Talk to your dietitian about how many servings of carbs you can eat at each meal.  Eat 4-6 ounces (oz) of lean protein each day, such as lean meat, chicken, fish, eggs, or tofu. One oz of lean protein is equal to: ? 1 oz of meat, chicken, or fish. ? 1 egg. ?  cup of tofu.  Eat some foods each day that contain healthy fats, such as avocado, nuts, seeds, and fish. Lifestyle  Check your blood glucose regularly.  Exercise regularly as told by your health care provider. This may include: ? 150 minutes of moderate-intensity or vigorous-intensity exercise each week. This could be brisk walking, biking, or water aerobics. ? Stretching and doing strength exercises, such as yoga or weightlifting, at least 2 times a week.  Take medicines as told by your health care provider.  Do not use any products that contain nicotine or tobacco, such as cigarettes and e-cigarettes. If you need help quitting, ask your health care provider.  Work with a Social worker or diabetes educator to identify strategies to manage stress and any emotional and social challenges. Questions to ask a health care provider  Do I need to meet with a diabetes educator?  Do I need to meet  with a dietitian?  What number can I call if I have questions?  When are the best times to check my blood glucose? Where to find more information:  American Diabetes Association: diabetes.org  Academy of Nutrition and Dietetics: www.eatright.[employer] of Diabetes and Digestive and Kidney Diseases (NIH): [employer] Summary  A healthy meal plan will help you control your blood glucose and maintain a healthy lifestyle.  Working with a diet and nutrition specialist (dietitian) can help you make a meal plan that is best for you.  Keep in mind that carbohydrates (carbs) and alcohol have immediate effects on your blood glucose levels. It is important to count carbs and to use alcohol carefully. This information is not intended to replace advice given to you by your health care provider. Make  sure you discuss any questions you have with your health care provider. Document Revised: 01/09/2017 Document Reviewed: 03/03/2016 Elsevier Patient Education  [employer].    If you have lab work done today you will be contacted with your lab results within the next 2 weeks.  If you have not heard from [place] then please contact us. The fastest way to get your results is to register for My Chart.   IF you received an x-ray today, you will receive an invoice from [facility]. Please contact [facility] at [phone] with questions or concerns regarding your invoice.   IF you received labwork today, you will receive an invoice from [facility]. Please contact [facility] at [phone] with questions or concerns regarding your invoice.   Our billing staff will not be able to assist you with questions regarding bills from these companies.  You will be contacted with the lab results as soon as they are available. The fastest way to get your results is to activate your My Chart account. Instructions are located on the last page of this paperwork. If you have not heard  from [place] regarding the results in 2 weeks, please contact this office.         Signed, Merri Ray, MD [facility]

## 2019-06-10 NOTE — Patient Instructions (Addendum)
Water is best, cut back on soda/sweet tea. Low intensity exercise like walking can help with weight loss.  Blood sugar at diabetes level last visit. Recheck in mid July.   Frequent urination may improve with better blood sugar control.  I will check some urine test again today, but if any continued difficulty with frequent urination or any worsening symptoms, return for recheck.  Return to the clinic or go to the nearest emergency room if any of your symptoms worsen or new symptoms occur.   Diabetes Mellitus and Nutrition, Adult When you have diabetes (diabetes mellitus), it is very important to have healthy eating habits because your blood sugar (glucose) levels are greatly affected by what you eat and drink. Eating healthy foods in the appropriate amounts, at about the same times every day, can help you:  Control your blood glucose.  Lower your risk of heart disease.  Improve your blood pressure.  Reach or maintain a healthy weight. Every person with diabetes is different, and each person has different needs for a meal plan. Your health care provider may recommend that you work with a diet and nutrition specialist (dietitian) to make a meal plan that is best for you. Your meal plan may vary depending on factors such as:  The calories you need.  The medicines you take.  Your weight.  Your blood glucose, blood pressure, and cholesterol levels.  Your activity level.  Other health conditions you have, such as heart or kidney disease. How do carbohydrates affect me? Carbohydrates, also called carbs, affect your blood glucose level more than any other type of food. Eating carbs naturally raises the amount of glucose in your blood. Carb counting is a method for keeping track of how many carbs you eat. Counting carbs is important to keep your blood glucose at a healthy level, especially if you use insulin or take certain oral diabetes medicines. It is important to know how many carbs  you can safely have in each meal. This is different for every person. Your dietitian can help you calculate how many carbs you should have at each meal and for each snack. Foods that contain carbs include:  Bread, cereal, rice, pasta, and crackers.  Potatoes and corn.  Peas, beans, and lentils.  Milk and yogurt.  Fruit and juice.  Desserts, such as cakes, cookies, ice cream, and candy. How does alcohol affect me? Alcohol can cause a sudden decrease in blood glucose (hypoglycemia), especially if you use insulin or take certain oral diabetes medicines. Hypoglycemia can be a life-threatening condition. Symptoms of hypoglycemia (sleepiness, dizziness, and confusion) are similar to symptoms of having too much alcohol. If your health care provider says that alcohol is safe for you, follow these guidelines:  Limit alcohol intake to no more than 1 drink per day for nonpregnant women and 2 drinks per day for men. One drink equals 12 oz of beer, 5 oz of wine, or 1 oz of hard liquor.  Do not drink on an empty stomach.  Keep yourself hydrated with water, diet soda, or unsweetened iced tea.  Keep in mind that regular soda, juice, and other mixers may contain a lot of sugar and must be counted as carbs. What are tips for following this plan?  Reading food labels  Start by checking the serving size on the "Nutrition Facts" label of packaged foods and drinks. The amount of calories, carbs, fats, and other nutrients listed on the label is based on one serving of the item.  Many items contain more than one serving per package.  Check the total grams (g) of carbs in one serving. You can calculate the number of servings of carbs in one serving by dividing the total carbs by 15. For example, if a food has 30 g of total carbs, it would be equal to 2 servings of carbs.  Check the number of grams (g) of saturated and trans fats in one serving. Choose foods that have low or no amount of these fats.  Check  the number of milligrams (mg) of salt (sodium) in one serving. Most people should limit total sodium intake to less than 2,300 mg per day.  Always check the nutrition information of foods labeled as "low-fat" or "nonfat". These foods may be higher in added sugar or refined carbs and should be avoided.  Talk to your dietitian to identify your daily goals for nutrients listed on the label. Shopping  Avoid buying canned, premade, or processed foods. These foods tend to be high in fat, sodium, and added sugar.  Shop around the outside edge of the grocery store. This includes fresh fruits and vegetables, bulk grains, fresh meats, and fresh dairy. Cooking  Use low-heat cooking methods, such as baking, instead of high-heat cooking methods like deep frying.  Cook using healthy oils, such as olive, canola, or sunflower oil.  Avoid cooking with butter, cream, or high-fat meats. Meal planning  Eat meals and snacks regularly, preferably at the same times every day. Avoid going long periods of time without eating.  Eat foods high in fiber, such as fresh fruits, vegetables, beans, and whole grains. Talk to your dietitian about how many servings of carbs you can eat at each meal.  Eat 4-6 ounces (oz) of lean protein each day, such as lean meat, chicken, fish, eggs, or tofu. One oz of lean protein is equal to: ? 1 oz of meat, chicken, or fish. ? 1 egg. ?  cup of tofu.  Eat some foods each day that contain healthy fats, such as avocado, nuts, seeds, and fish. Lifestyle  Check your blood glucose regularly.  Exercise regularly as told by your health care provider. This may include: ? 150 minutes of moderate-intensity or vigorous-intensity exercise each week. This could be brisk walking, biking, or water aerobics. ? Stretching and doing strength exercises, such as yoga or weightlifting, at least 2 times a week.  Take medicines as told by your health care provider.  Do not use any products that  contain nicotine or tobacco, such as cigarettes and e-cigarettes. If you need help quitting, ask your health care provider.  Work with a Social worker or diabetes educator to identify strategies to manage stress and any emotional and social challenges. Questions to ask a health care provider  Do I need to meet with a diabetes educator?  Do I need to meet with a dietitian?  What number can I call if I have questions?  When are the best times to check my blood glucose? Where to find more information:  American Diabetes Association: diabetes.org  Academy of Nutrition and Dietetics: www.eatright.[employer] of Diabetes and Digestive and Kidney Diseases (NIH): [employer] Summary  A healthy meal plan will help you control your blood glucose and maintain a healthy lifestyle.  Working with a diet and nutrition specialist (dietitian) can help you make a meal plan that is best for you.  Keep in mind that carbohydrates (carbs) and alcohol have immediate effects on your blood glucose levels. It is  important to count carbs and to use alcohol carefully. This information is not intended to replace advice given to you by your health care provider. Make sure you discuss any questions you have with your health care provider. Document Revised: 01/09/2017 Document Reviewed: 03/03/2016 Elsevier Patient Education  [employer].    If you have lab work done today you will be contacted with your lab results within the next 2 weeks.  If you have not heard from [place] then please contact us. The fastest way to get your results is to register for My Chart.   IF you received an x-ray today, you will receive an invoice from [facility]. Please contact [facility] at [phone] with questions or concerns regarding your invoice.   IF you received labwork today, you will receive an invoice from [facility]. Please contact [facility] at [phone] with questions or concerns  regarding your invoice.   Our billing staff will not be able to assist you with questions regarding bills from these companies.  You will be contacted with the lab results as soon as they are available. The fastest way to get your results is to activate your My Chart account. Instructions are located on the last page of this paperwork. If you have not heard from [place] regarding the results in 2 weeks, please contact this office.

## 2019-06-11 LAB — URINE CULTURE

## 2019-06-13 ENCOUNTER — Encounter: Payer: Self-pay | Admitting: Allergy

## 2019-06-13 NOTE — Progress Notes (Signed)
Received note from ENT - Dr. Benjamine Mola.  Patient had surgery (septoplasty and turbinate reduction) last year with good benefit. Had polyps which was treated with oral steroids.

## 2019-06-15 ENCOUNTER — Other Ambulatory Visit: Payer: Self-pay

## 2019-06-15 ENCOUNTER — Ambulatory Visit (AMBULATORY_SURGERY_CENTER): Payer: Self-pay | Admitting: *Deleted

## 2019-06-15 VITALS — Temp 97.1°F | Ht 72.0 in | Wt 264.0 lb

## 2019-06-15 DIAGNOSIS — Z1211 Encounter for screening for malignant neoplasm of colon: Secondary | ICD-10-CM

## 2019-06-15 MED ORDER — NA SULFATE-K SULFATE-MG SULF 17.5-3.13-1.6 GM/177ML PO SOLN: ORAL | 0 refills | Status: DC

## 2019-06-15 NOTE — Progress Notes (Signed)
Patient is here in-person for PV. Patient denies any allergies to eggs or soy. Patient denies any problems with anesthesia/sedation. Patient denies any oxygen use at home. Patient denies taking any diet/weight loss medications or blood thinners. Patient is not being treated for MRSA or C-diff. Patient is aware of our care-partner policy and 0000000 safety protocol.  Completed covid vaccines on 04/08/19 per pt.  Patient request [employer] mail order pharmacy be used. He is aware to call us back after 10 days if he has not received the Suprep, NOT to wait until last minute. We can send to local pharmacy.

## 2019-06-20 ENCOUNTER — Telehealth: Payer: Self-pay | Admitting: Gastroenterology

## 2019-06-20 DIAGNOSIS — Z1211 Encounter for screening for malignant neoplasm of colon: Secondary | ICD-10-CM

## 2019-06-20 MED ORDER — NA SULFATE-K SULFATE-MG SULF 17.5-3.13-1.6 GM/177ML PO SOLN: ORAL | 0 refills | Status: DC

## 2019-06-20 NOTE — Telephone Encounter (Signed)
suprep rx sent to [facility] per pt's request.

## 2019-06-20 NOTE — Telephone Encounter (Signed)
Pt would like suprep sent to [facility] on [address].

## 2019-06-21 ENCOUNTER — Ambulatory Visit: Payer: Medicare HMO | Admitting: Family Medicine

## 2019-06-21 VITALS — BP 122/78 | Ht 72.0 in | Wt 264.0 lb

## 2019-06-21 DIAGNOSIS — Z0000 Encounter for general adult medical examination without abnormal findings: Secondary | ICD-10-CM

## 2019-06-21 NOTE — Patient Instructions (Signed)
Thank you for taking time to come for your Medicare Wellness Visit. I appreciate your ongoing commitment to your health goals. Please review the following plan we discussed and let me know if I can assist you in the future.  Jordan Kennedy LPN  Preventive Care 71 Years and Older, Male Preventive care refers to lifestyle choices and visits with your health care provider that can promote health and wellness. This includes:  A yearly physical exam. This is also called an annual well check.  Regular dental and eye exams.  Immunizations.  Screening for certain conditions.  Healthy lifestyle choices, such as diet and exercise. What can I expect for my preventive care visit? Physical exam Your health care provider will check:  Height and weight. These may be used to calculate body mass index (BMI), which is a measurement that tells if you are at a healthy weight.  Heart rate and blood pressure.  Your skin for abnormal spots. Counseling Your health care provider may ask you questions about:  Alcohol, tobacco, and drug use.  Emotional well-being.  Home and relationship well-being.  Sexual activity.  Eating habits.  History of falls.  Memory and ability to understand (cognition).  Work and work Statistician. What immunizations do I need?  Influenza (flu) vaccine  This is recommended every year. Tetanus, diphtheria, and pertussis (Tdap) vaccine  You may need a Td booster every 10 years. Varicella (chickenpox) vaccine  You may need this vaccine if you have not already been vaccinated. Zoster (shingles) vaccine  You may need this after age 71. Pneumococcal conjugate (PCV13) vaccine  One dose is recommended after age 71. Pneumococcal polysaccharide (PPSV23) vaccine  One dose is recommended after age 71. Measles, mumps, and rubella (MMR) vaccine  You may need at least one dose of MMR if you were born in 1957 or later. You may also need a second dose. Meningococcal  conjugate (MenACWY) vaccine  You may need this if you have certain conditions. Hepatitis A vaccine  You may need this if you have certain conditions or if you travel or work in places where you may be exposed to hepatitis A. Hepatitis B vaccine  You may need this if you have certain conditions or if you travel or work in places where you may be exposed to hepatitis B. Haemophilus influenzae type b (Hib) vaccine  You may need this if you have certain conditions. You may receive vaccines as individual doses or as more than one vaccine together in one shot (combination vaccines). Talk with your health care provider about the risks and benefits of combination vaccines. What tests do I need? Blood tests  Lipid and cholesterol levels. These may be checked every 5 years, or more frequently depending on your overall health.  Hepatitis C test.  Hepatitis B test. Screening  Lung cancer screening. You may have this screening every year starting at age 24 if you have a 30-pack-year history of smoking and currently smoke or have quit within the past 15 years.  Colorectal cancer screening. All adults should have this screening starting at age 52 and continuing until age 61. Your health care provider may recommend screening at age 57 if you are at increased risk. You will have tests every 1-10 years, depending on your results and the type of screening test.  Prostate cancer screening. Recommendations will vary depending on your family history and other risks.  Diabetes screening. This is done by checking your blood sugar (glucose) after you have not eaten for  a while (fasting). You may have this done every 1-3 years.  Abdominal aortic aneurysm (AAA) screening. You may need this if you are a current or former smoker.  Sexually transmitted disease (STD) testing. Follow these instructions at home: Eating and drinking  Eat a diet that includes fresh fruits and vegetables, whole grains, lean  protein, and low-fat dairy products. Limit your intake of foods with high amounts of sugar, saturated fats, and salt.  Take vitamin and mineral supplements as recommended by your health care provider.  Do not drink alcohol if your health care provider tells you not to drink.  If you drink alcohol: ? Limit how much you have to 0-2 drinks a day. ? Be aware of how much alcohol is in your drink. In the [place] one drink equals one 12 oz bottle of beer (355 mL), one 5 oz glass of wine (148 mL), or one 1 oz glass of hard liquor (44 mL). Lifestyle  Take daily care of your teeth and gums.  Stay active. Exercise for at least 30 minutes on 5 or more days each week.  Do not use any products that contain nicotine or tobacco, such as cigarettes, e-cigarettes, and chewing tobacco. If you need help quitting, ask your health care provider.  If you are sexually active, practice safe sex. Use a condom or other form of protection to prevent STIs (sexually transmitted infections).  Talk with your health care provider about taking a low-dose aspirin or statin. What's next?  Visit your health care provider once a year for a well check visit.  Ask your health care provider how often you should have your eyes and teeth checked.  Stay up to date on all vaccines. This information is not intended to replace advice given to you by your health care provider. Make sure you discuss any questions you have with your health care provider. Document Revised: 01/21/2018 Document Reviewed: 01/21/2018 Elsevier Patient Education  2020 Elsevier Inc.  

## 2019-06-21 NOTE — Progress Notes (Unsigned)
Presents today for [employer] Visit   Date of last exam: 06-10-2019  Interpreter used for this visit? No  I connected with  Porfirio Mylar on 06/21/19 by telephone  and verified that I am speaking with the correct person using two identifiers.   I discussed the limitations of evaluation and management by telemedicine. The patient expressed understanding and agreed to proceed.    Patient Care Team: [name], MD as PCP - General (Family Medicine) Jettie Booze, MD as PCP - Cardiology (Cardiology) [facility], [facility]   Other items to address today:   Discussed immunizations Discussed Eye/Dental 7-30 @ 1:40 follow up Dr. Carlota Raspberry   Other Screening: Last screening for diabetes: 05/27/2019 Last lipid screening: 06-22-2018  ADVANCE DIRECTIVES: Discussed: yes On File: no Materials Provided: yes  Immunization status:  Immunization History  Administered Date(s) Administered  . Fluad Quad(high Dose 65+) 12/10/2018  . Influenza, High Dose Seasonal PF 11/18/2017  . Influenza,inj,Quad PF,6+ Mos 12/11/2014, 03/27/2017  . Influenza-Unspecified 11/16/2017  . PFIZER SARS-COV-2 Vaccination 03/18/2019, 04/08/2019  . Pneumococcal Conjugate-13 09/04/2014  . Pneumococcal Polysaccharide-23 09/17/2010, 03/27/2017  . Tdap 08/21/2008     There are no preventive care reminders to display for this patient.   Functional Status Survey: Is the patient deaf or have difficulty hearing?: No Does the patient have difficulty seeing, even when wearing glasses/contacts?: No Does the patient have difficulty concentrating, remembering, or making decisions?: No Does the patient have difficulty walking or climbing stairs?: No Does the patient have difficulty dressing or bathing?: No Does the patient have difficulty doing errands alone such as visiting a doctor's office or shopping?: No   6CIT Screen 06/21/2019 04/14/2018 03/27/2017  What Year? 0 points 0  points 0 points  What month? 0 points 0 points 0 points  What time? 0 points 0 points 0 points  Count back from 20 0 points 0 points 0 points  Months in reverse 0 points 0 points 0 points  Repeat phrase 2 points 0 points 0 points  Total Score 2 0 0        Clinical Support from 06/21/2019 in [place] [facility] [place]  AUDIT-C Score  2       Home Environment:   Lives in a two story home No trouble climbing stairs No grab bars No scattered rugs Adequate lighting   Patient Active Problem List   Diagnosis Date Noted  . Acute gouty arthritis 02/26/2018  . Non-allergic rhinitis 01/06/2018     Past Medical History:  Diagnosis Date  . Anxiety   . Arthritis   . Cancer [facility]) 2011   Prostate cancer  sx in 2011  . Depression   . Deviated septum   . Hypertension   . Nasal turbinate hypertrophy      Past Surgical History:  Procedure Laterality Date  . COLONOSCOPY  2000   Dr.Orr "normal" per pt-no report   . NASAL SEPTOPLASTY W/ TURBINOPLASTY Bilateral 10/19/2018   Procedure: NASAL SEPTOPLASTY WITH  BILATERAL TURBINATE REDUCTION;  Surgeon: [name], MD;  Location: [facility];  Service: ENT;  Laterality: Bilateral;  . PROSTATE SURGERY       Family History  Problem Relation Age of Onset  . Heart disease Mother   . Asthma Mother   . Cancer Father   . Diabetes Father   . Colon polyps Brother   . Allergic rhinitis Neg Hx   . Eczema Neg Hx   . Urticaria  Neg Hx   . Colon cancer Neg Hx   . Esophageal cancer Neg Hx   . Rectal cancer Neg Hx   . Stomach cancer Neg Hx      Social History   Socioeconomic History  . Marital status: Divorced    Spouse name: Not on file  . Number of children: 1  . Years of education: Not on file  . Highest education level: Some college, no degree  Occupational History  . Not on file  Tobacco Use  . Smoking status: Current Every Day Smoker    Types: Cigars  . Smokeless tobacco: Never Used  . Tobacco comment: about 7 small  cigars daily  Substance and Sexual Activity  . Alcohol use: Yes    Alcohol/week: 3.0 standard drinks    Types: 2 Shots of liquor, 1 Cans of beer per week  . Drug use: No  . Sexual activity: Yes  Other Topics Concern  . Not on file  Social History Narrative  . Not on file   Social Determinants of Health   Financial Resource Strain:   . Difficulty of Paying Living Expenses:   Food Insecurity:   . Worried About Charity fundraiser in the Last Year:   . Arboriculturist in the Last Year:   Transportation Needs:   . Film/video editor (Medical):   Marland Kitchen Lack of Transportation (Non-Medical):   Physical Activity:   . Days of Exercise per Week:   . Minutes of Exercise per Session:   Stress:   . Feeling of Stress :   Social Connections:   . Frequency of Communication with Friends and Family:   . Frequency of Social Gatherings with Friends and Family:   . Attends Religious Services:   . Active Member of Clubs or Organizations:   . Attends Archivist Meetings:   Marland Kitchen Marital Status:   Intimate Partner Violence:   . Fear of Current or Ex-Partner:   . Emotionally Abused:   Marland Kitchen Physically Abused:   . Sexually Abused:      No Known Allergies   Prior to Admission medications   Medication Sig Start Date End Date Taking? Authorizing Provider  allopurinol (ZYLOPRIM) 100 MG tablet Take 1 tablet (100 mg total) by mouth daily. 06/10/19  Yes [name], MD  colchicine 0.6 MG tablet Take 1 tablet (0.6 mg total) by mouth daily. 06/10/19  Yes [name], MD  fluticasone Asencion Islam) 50 MCG/ACT nasal spray  03/11/19  Yes [provider]  lisinopril (ZESTRIL) 10 MG tablet Take 1 tablet (10 mg total) by mouth daily. 06/10/19  Yes [name], MD  metoprolol succinate (TOPROL-XL) 25 MG 24 hr tablet Take 1 tablet (25 mg total) by mouth daily. 06/10/19  Yes [name], MD  Na Sulfate-K Sulfate-Mg Sulf 17.5-3.13-1.6 GM/177ML SOLN Suprep (no substitutions)-TAKE AS  DIRECTED. 06/20/19  Yes Armbruster, Carlota Raspberry, MD  sertraline (ZOLOFT) 50 MG tablet TAKE 1 TABLET EVERY DAY 06/10/19  Yes [name], MD  sildenafil (VIAGRA) 100 MG tablet Take 100 mg by mouth daily. 04/19/18  Yes [provider]  triamcinolone cream (KENALOG) 0.1 % Apply 1 application topically 2 (two) times daily. Patient not taking: Reported on 06/21/2019 02/02/19   [name], MD     Depression screen [facility] 2/9 06/21/2019 06/10/2019 05/27/2019 04/04/2019 02/02/2019  Decreased Interest 0 0 0 0 0  Down, Depressed, Hopeless 0 0 0 0 0  PHQ - 2 Score  0 0 0 0 0  Altered sleeping - - - - -  Tired, decreased energy - - - - -  Change in appetite - - - - -  Feeling bad or failure about yourself  - - - - -  Trouble concentrating - - - - -  Moving slowly or fidgety/restless - - - - -  Suicidal thoughts - - - - -  PHQ-9 Score - - - - -  Difficult doing work/chores - - - - -     Fall Risk  06/21/2019 06/10/2019 05/27/2019 04/04/2019 02/02/2019  Falls in the past year? 0 0 0 0 0  Number falls in past yr: 0 - 0 0 0  Injury with Fall? 0 - 0 0 0  Follow up Falls evaluation completed;Education provided Falls evaluation completed - Falls evaluation completed Falls evaluation completed      PHYSICAL EXAM: BP 122/78 Comment: taken from a previous visit patient not in clinic  Ht 6' (1.829 m)   Wt 264 lb (119.7 kg)   BMI 35.80 kg/m    Wt Readings from Last 3 Encounters:  06/21/19 264 lb (119.7 kg)  06/15/19 264 lb (119.7 kg)  06/10/19 263 lb (119.3 kg)       Education/Counseling provided regarding diet and exercise, prevention of chronic diseases, smoking/tobacco cessation, if applicable, and reviewed "Covered Medicare Preventive Services."

## 2019-06-22 NOTE — Telephone Encounter (Signed)
Pt called on 06/13/2019 by Sharyon Cable and given lab results per Carlota Raspberry.

## 2019-06-29 ENCOUNTER — Other Ambulatory Visit: Payer: Self-pay

## 2019-06-29 ENCOUNTER — Encounter: Payer: Self-pay | Admitting: Gastroenterology

## 2019-06-29 ENCOUNTER — Ambulatory Visit (AMBULATORY_SURGERY_CENTER): Payer: Medicare HMO | Admitting: Gastroenterology

## 2019-06-29 VITALS — BP 142/95 | HR 68 | Temp 97.7°F | Resp 18 | Ht 72.0 in | Wt 264.0 lb

## 2019-06-29 DIAGNOSIS — D122 Benign neoplasm of ascending colon: Secondary | ICD-10-CM | POA: Diagnosis not present

## 2019-06-29 DIAGNOSIS — K6289 Other specified diseases of anus and rectum: Secondary | ICD-10-CM | POA: Diagnosis not present

## 2019-06-29 DIAGNOSIS — D125 Benign neoplasm of sigmoid colon: Secondary | ICD-10-CM

## 2019-06-29 DIAGNOSIS — K6389 Other specified diseases of intestine: Secondary | ICD-10-CM | POA: Diagnosis not present

## 2019-06-29 DIAGNOSIS — Z1211 Encounter for screening for malignant neoplasm of colon: Secondary | ICD-10-CM

## 2019-06-29 DIAGNOSIS — D123 Benign neoplasm of transverse colon: Secondary | ICD-10-CM | POA: Diagnosis not present

## 2019-06-29 MED ORDER — SODIUM CHLORIDE 0.9 % IV SOLN: 500.0000 mL | Freq: Once | INTRAVENOUS | Status: DC

## 2019-06-29 NOTE — Progress Notes (Signed)
Pt's states no medical or surgical changes since previsit or office visit.  Temp- June Vitals- Donna 

## 2019-06-29 NOTE — Progress Notes (Signed)
Called to room to assist during endoscopic procedure.  Patient ID and intended procedure confirmed with present staff. Received instructions for my participation in the procedure from the performing physician.  

## 2019-06-29 NOTE — Op Note (Signed)
[place] Patient Name: Jordan Hines Procedure Date: 06/29/2019 11:41 AM MRN: [id number] Endoscopist: Remo Lipps P. Havery Moros , MD Age: 69 Referring MD:  Date of Birth: 1950-11-30 Gender: Male Account #: [id number] Procedure:                Colonoscopy Indications:              Screening for colorectal malignant neoplasm Medicines:                Monitored Anesthesia Care Procedure:                Pre-Anesthesia Assessment:                           - Prior to the procedure, a History and Physical                            was performed, and patient medications and                            allergies were reviewed. The patient's tolerance of                            previous anesthesia was also reviewed. The risks                            and benefits of the procedure and the sedation                            options and risks were discussed with the patient.                            All questions were answered, and informed consent                            was obtained. Prior Anticoagulants: The patient has                            taken no previous anticoagulant or antiplatelet                            agents. ASA Grade Assessment: II - A patient with                            mild systemic disease. After reviewing the risks                            and benefits, the patient was deemed in                            satisfactory condition to undergo the procedure.                           After obtaining informed consent, the colonoscope  was passed under direct vision. Throughout the                            procedure, the patient's blood pressure, pulse, and                            oxygen saturations were monitored continuously. The                            Colonoscope was introduced through the anus and                            advanced to the the cecum, identified by                            appendiceal orifice  and ileocecal valve. The                            colonoscopy was performed without difficulty. The                            patient tolerated the procedure well. The quality                            of the bowel preparation was adequate. The                            ileocecal valve, appendiceal orifice, and rectum                            were photographed. Scope In: 11:50:47 AM Scope Out: 12:16:22 PM Scope Withdrawal Time: 0 hours 22 minutes 15 seconds  Total Procedure Duration: 0 hours 25 minutes 35 seconds  Findings:                 The perianal and digital rectal examinations were                            normal.                           Four sessile polyps were found in the ascending                            colon. The polyps were diminutive in size. These                            polyps were removed with a cold biopsy forceps.                            Resection and retrieval were complete.                           Three sessile polyps were found in the transverse  colon. The polyps were 3 to 4 mm in size. These                            polyps were removed with a cold snare. Resection                            and retrieval were complete.                           A 3 mm polyp was found in the sigmoid colon. The                            polyp was sessile. The polyp was removed with a                            cold snare. Resection and retrieval were complete.                           A few small-mouthed diverticula were found in the                            sigmoid colon.                           Anal papilla(e) were hypertrophied. Biopsies were                            taken with a cold forceps for histology to rule out                            AIN.                           Internal hemorrhoids were found during retroflexion.                           The exam was otherwise without abnormality. Complications:             No immediate complications. Estimated blood loss:                            Minimal. Estimated Blood Loss:     Estimated blood loss was minimal. Impression:               - Four diminutive polyps in the ascending colon,                            removed with a cold biopsy forceps. Resected and                            retrieved.                           - Three 3 to 4 mm polyps in the transverse colon,  removed with a cold snare. Resected and retrieved.                           - One 3 mm polyp in the sigmoid colon, removed with                            a cold snare. Resected and retrieved.                           - Diverticulosis in the sigmoid colon.                           - Anal papilla(e) were hypertrophied. Biopsied to                            rule out AIN.                           - Internal hemorrhoids.                           - The examination was otherwise normal. Recommendation:           - Patient has a contact number available for                            emergencies. The signs and symptoms of potential                            delayed complications were discussed with the                            patient. Return to normal activities tomorrow.                            Written discharge instructions were provided to the                            patient.                           - Resume previous diet.                           - Continue present medications.                           - Await pathology results. Remo Lipps P. Azarias Chiou, MD 06/29/2019 12:23:31 PM This report has been signed electronically.

## 2019-06-29 NOTE — Patient Instructions (Signed)
Handouts provided on polyps, diverticulosis and hemorrhoids.   YOU HAD AN ENDOSCOPIC PROCEDURE TODAY AT THE [facility]:   Refer to the procedure report that was given to you for any specific questions about what was found during the examination.  If the procedure report does not answer your questions, please call your gastroenterologist to clarify.  If you requested that your care partner not be given the details of your procedure findings, then the procedure report has been included in a sealed envelope for you to review at your convenience later.  YOU SHOULD EXPECT: Some feelings of bloating in the abdomen. Passage of more gas than usual.  Walking can help get rid of the air that was put into your GI tract during the procedure and reduce the bloating. If you had a lower endoscopy (such as a colonoscopy or flexible sigmoidoscopy) you may notice spotting of blood in your stool or on the toilet paper. If you underwent a bowel prep for your procedure, you may not have a normal bowel movement for a few days.  Please Note:  You might notice some irritation and congestion in your nose or some drainage.  This is from the oxygen used during your procedure.  There is no need for concern and it should clear up in a day or so.  SYMPTOMS TO REPORT IMMEDIATELY:   Following lower endoscopy (colonoscopy or flexible sigmoidoscopy):  Excessive amounts of blood in the stool  Significant tenderness or worsening of abdominal pains  Swelling of the abdomen that is new, acute  Fever of 100F or higher   For urgent or emergent issues, a gastroenterologist can be reached at any hour by calling [phone]. Do not use MyChart messaging for urgent concerns.    DIET:  We do recommend a small meal at first, but then you may proceed to your regular diet.  Drink plenty of fluids but you should avoid alcoholic beverages for 24 hours.  ACTIVITY:  You should plan to take it easy for the rest of today and  you should NOT DRIVE or use heavy machinery until tomorrow (because of the sedation medicines used during the test).    FOLLOW UP: Our staff will call the number listed on your records 48-72 hours following your procedure to check on you and address any questions or concerns that you may have regarding the information given to you following your procedure. If we do not reach you, we will leave a message.  We will attempt to reach you two times.  During this call, we will ask if you have developed any symptoms of COVID 19. If you develop any symptoms (ie: fever, flu-like symptoms, shortness of breath, cough etc.) before then, please call [phone].  If you test positive for Covid 19 in the 2 weeks post procedure, please call and report this information to us.    If any biopsies were taken you will be contacted by phone or by letter within the next 1-3 weeks.  Please call us at [phone] if you have not heard about the biopsies in 3 weeks.    SIGNATURES/CONFIDENTIALITY: You and/or your care partner have signed paperwork which will be entered into your electronic medical record.  These signatures attest to the fact that that the information above on your After Visit Summary has been reviewed and is understood.  Full responsibility of the confidentiality of this discharge information lies with you and/or your care-partner.  

## 2019-06-29 NOTE — Progress Notes (Signed)
pt tolerated well. VSS. awake and to recovery. Report given to RN.  

## 2019-07-01 ENCOUNTER — Telehealth: Payer: Self-pay

## 2019-07-01 NOTE — Telephone Encounter (Signed)
1st follow up call made.  NALM 

## 2019-07-01 NOTE — Telephone Encounter (Signed)
2nd follow up call made.  NALM 

## 2019-09-09 ENCOUNTER — Other Ambulatory Visit: Payer: Self-pay

## 2019-09-09 ENCOUNTER — Ambulatory Visit (INDEPENDENT_AMBULATORY_CARE_PROVIDER_SITE_OTHER): Payer: Medicare HMO | Admitting: Family Medicine

## 2019-09-09 ENCOUNTER — Encounter: Payer: Self-pay | Admitting: Family Medicine

## 2019-09-09 VITALS — BP 137/79 | HR 84 | Temp 98.1°F | Ht 72.0 in | Wt 273.0 lb

## 2019-09-09 DIAGNOSIS — E669 Obesity, unspecified: Secondary | ICD-10-CM | POA: Diagnosis not present

## 2019-09-09 DIAGNOSIS — E1169 Type 2 diabetes mellitus with other specified complication: Secondary | ICD-10-CM | POA: Diagnosis not present

## 2019-09-09 DIAGNOSIS — Z6835 Body mass index (BMI) 35.0-35.9, adult: Secondary | ICD-10-CM

## 2019-09-09 LAB — POCT GLYCOSYLATED HEMOGLOBIN (HGB A1C): Hemoglobin A1C: 5.7 % — AB (ref 4.0–5.6)

## 2019-09-09 NOTE — Patient Instructions (Addendum)
Good news.  Hemoglobin A1c has decreased.  See information below for some more helpful his with nutrition. Try to prepare more food yourself as you will be able to control what goes into it better.  Try to decrease sodas - water is best.  Be careful with too many fruits as those also have sugar - see info below.  Try to walk starting at 11min per day as initial goal - ultimate goal of 30 mins 5 days per week. Set reasonable goal initially.    Diabetes Mellitus and Nutrition, Adult When you have diabetes (diabetes mellitus), it is very important to have healthy eating habits because your blood sugar (glucose) levels are greatly affected by what you eat and drink. Eating healthy foods in the appropriate amounts, at about the same times every day, can help you:  Control your blood glucose.  Lower your risk of heart disease.  Improve your blood pressure.  Reach or maintain a healthy weight. Every person with diabetes is different, and each person has different needs for a meal plan. Your health care provider may recommend that you work with a diet and nutrition specialist (dietitian) to make a meal plan that is best for you. Your meal plan may vary depending on factors such as:  The calories you need.  The medicines you take.  Your weight.  Your blood glucose, blood pressure, and cholesterol levels.  Your activity level.  Other health conditions you have, such as heart or kidney disease. How do carbohydrates affect me? Carbohydrates, also called carbs, affect your blood glucose level more than any other type of food. Eating carbs naturally raises the amount of glucose in your blood. Carb counting is a method for keeping track of how many carbs you eat. Counting carbs is important to keep your blood glucose at a healthy level, especially if you use insulin or take certain oral diabetes medicines. It is important to know how many carbs you can safely have in each meal. This is different  for every person. Your dietitian can help you calculate how many carbs you should have at each meal and for each snack. Foods that contain carbs include:  Bread, cereal, rice, pasta, and crackers.  Potatoes and corn.  Peas, beans, and lentils.  Milk and yogurt.  Fruit and juice.  Desserts, such as cakes, cookies, ice cream, and candy. How does alcohol affect me? Alcohol can cause a sudden decrease in blood glucose (hypoglycemia), especially if you use insulin or take certain oral diabetes medicines. Hypoglycemia can be a life-threatening condition. Symptoms of hypoglycemia (sleepiness, dizziness, and confusion) are similar to symptoms of having too much alcohol. If your health care provider says that alcohol is safe for you, follow these guidelines:  Limit alcohol intake to no more than 1 drink per day for nonpregnant women and 2 drinks per day for men. One drink equals 12 oz of beer, 5 oz of wine, or 1 oz of hard liquor.  Do not drink on an empty stomach.  Keep yourself hydrated with water, diet soda, or unsweetened iced tea.  Keep in mind that regular soda, juice, and other mixers may contain a lot of sugar and must be counted as carbs. What are tips for following this plan?  Reading food labels  Start by checking the serving size on the "Nutrition Facts" label of packaged foods and drinks. The amount of calories, carbs, fats, and other nutrients listed on the label is based on one serving of the item.  Many items contain more than one serving per package.  Check the total grams (g) of carbs in one serving. You can calculate the number of servings of carbs in one serving by dividing the total carbs by 15. For example, if a food has 30 g of total carbs, it would be equal to 2 servings of carbs.  Check the number of grams (g) of saturated and trans fats in one serving. Choose foods that have low or no amount of these fats.  Check the number of milligrams (mg) of salt (sodium) in  one serving. Most people should limit total sodium intake to less than 2,300 mg per day.  Always check the nutrition information of foods labeled as "low-fat" or "nonfat". These foods may be higher in added sugar or refined carbs and should be avoided.  Talk to your dietitian to identify your daily goals for nutrients listed on the label. Shopping  Avoid buying canned, premade, or processed foods. These foods tend to be high in fat, sodium, and added sugar.  Shop around the outside edge of the grocery store. This includes fresh fruits and vegetables, bulk grains, fresh meats, and fresh dairy. Cooking  Use low-heat cooking methods, such as baking, instead of high-heat cooking methods like deep frying.  Cook using healthy oils, such as olive, canola, or sunflower oil.  Avoid cooking with butter, cream, or high-fat meats. Meal planning  Eat meals and snacks regularly, preferably at the same times every day. Avoid going long periods of time without eating.  Eat foods high in fiber, such as fresh fruits, vegetables, beans, and whole grains. Talk to your dietitian about how many servings of carbs you can eat at each meal.  Eat 4-6 ounces (oz) of lean protein each day, such as lean meat, chicken, fish, eggs, or tofu. One oz of lean protein is equal to: ? 1 oz of meat, chicken, or fish. ? 1 egg. ?  cup of tofu.  Eat some foods each day that contain healthy fats, such as avocado, nuts, seeds, and fish. Lifestyle  Check your blood glucose regularly.  Exercise regularly as told by your health care provider. This may include: ? 150 minutes of moderate-intensity or vigorous-intensity exercise each week. This could be brisk walking, biking, or water aerobics. ? Stretching and doing strength exercises, such as yoga or weightlifting, at least 2 times a week.  Take medicines as told by your health care provider.  Do not use any products that contain nicotine or tobacco, such as cigarettes and  e-cigarettes. If you need help quitting, ask your health care provider.  Work with a Social worker or diabetes educator to identify strategies to manage stress and any emotional and social challenges. Questions to ask a health care provider  Do I need to meet with a diabetes educator?  Do I need to meet with a dietitian?  What number can I call if I have questions?  When are the best times to check my blood glucose? Where to find more information:  American Diabetes Association: diabetes.org  Academy of Nutrition and Dietetics: www.eatright.[employer] of Diabetes and Digestive and Kidney Diseases (NIH): [employer] Summary  A healthy meal plan will help you control your blood glucose and maintain a healthy lifestyle.  Working with a diet and nutrition specialist (dietitian) can help you make a meal plan that is best for you.  Keep in mind that carbohydrates (carbs) and alcohol have immediate effects on your blood glucose levels. It is  important to count carbs and to use alcohol carefully. This information is not intended to replace advice given to you by your health care provider. Make sure you discuss any questions you have with your health care provider. Document Revised: 01/09/2017 Document Reviewed: 03/03/2016 Elsevier Patient Education  [employer].   If you have lab work done today you will be contacted with your lab results within the next 2 weeks.  If you have not heard from [place] then please contact us. The fastest way to get your results is to register for My Chart.   IF you received an x-ray today, you will receive an invoice from [facility]. Please contact [facility] at [phone] with questions or concerns regarding your invoice.   IF you received labwork today, you will receive an invoice from [place]. Please contact [facility] at [phone] with questions or concerns regarding your invoice.   Our billing staff will not be  able to assist you with questions regarding bills from these companies.  You will be contacted with the lab results as soon as they are available. The fastest way to get your results is to activate your My Chart account. Instructions are located on the last page of this paperwork. If you have not heard from [place] regarding the results in 2 weeks, please contact this office.

## 2019-09-09 NOTE — Progress Notes (Signed)
Subjective:  Patient ID: Jordan Hines, male    DOB: 02-10-1951  Age: 69 y.o. MRN: [id number]  CC:  Chief Complaint  Patient presents with  . Prediabetes    discuss diet and excercise     HPI Jordan Hines presents for   Diabetes: Previous prediabetes, most recent A1c in April at diabetes level.  Initial plan on diet/exercise/low intensity exercise approach without new medication prescribed at April 30th visit. Trying to eat more vegetables, more fruit.  Fast food: occasional - 2-3 times per week. Usual take out or restaurant.  Soda/sweet tea: 2 sodas per day.  More sedentary work with part time job. No exercise.  Wt Readings from Last 3 Encounters:  09/09/19 (!) 273 lb (123.8 kg)  06/29/19 264 lb (119.7 kg)  06/21/19 264 lb (119.7 kg)    Microalbumin: collect today.  Optho, foot exam, pneumovax:  optho visit  In few months.   Lab Results  Component Value Date   HGBA1C 6.9 (A) 05/27/2019   HGBA1C 5.8 (H) 06/22/2018   HGBA1C 6.1 (H) 03/24/2017   Lab Results  Component Value Date   LDLCALC 87 06/22/2018   CREATININE 0.96 04/04/2019    History Patient Active Problem List   Diagnosis Date Noted  . Acute gouty arthritis 02/26/2018  . Non-allergic rhinitis 01/06/2018   Past Medical History:  Diagnosis Date  . Anxiety   . Arthritis   . Cancer [facility]) 2011   Prostate cancer  sx in 2011  . Depression   . Deviated septum   . Hypertension   . Nasal turbinate hypertrophy    Past Surgical History:  Procedure Laterality Date  . COLONOSCOPY  2000   Dr.Orr "normal" per pt-no report   . NASAL SEPTOPLASTY W/ TURBINOPLASTY Bilateral 10/19/2018   Procedure: NASAL SEPTOPLASTY WITH  BILATERAL TURBINATE REDUCTION;  Surgeon: [name], MD;  Location: [place];  Service: ENT;  Laterality: Bilateral;  . PROSTATE SURGERY     No Known Allergies Prior to Admission medications   Medication Sig Start Date End Date Taking? Authorizing Provider  allopurinol  (ZYLOPRIM) 100 MG tablet Take 1 tablet (100 mg total) by mouth daily. 06/10/19  Yes [name], MD  colchicine 0.6 MG tablet Take 1 tablet (0.6 mg total) by mouth daily. 06/10/19  Yes [name], MD  fluticasone Asencion Islam) 50 MCG/ACT nasal spray  03/11/19  Yes [provider]  lisinopril (ZESTRIL) 10 MG tablet Take 1 tablet (10 mg total) by mouth daily. 06/10/19  Yes [name], MD  metoprolol succinate (TOPROL-XL) 25 MG 24 hr tablet Take 1 tablet (25 mg total) by mouth daily. 06/10/19  Yes [name], MD  sertraline (ZOLOFT) 50 MG tablet TAKE 1 TABLET EVERY DAY 06/10/19  Yes [name], MD  sildenafil (VIAGRA) 100 MG tablet Take 100 mg by mouth daily. 04/19/18  Yes [provider]  triamcinolone cream (KENALOG) 0.1 % Apply 1 application topically 2 (two) times daily. 02/02/19  Yes [name], MD   Social History   Socioeconomic History  . Marital status: Divorced    Spouse name: Not on file  . Number of children: 1  . Years of education: Not on file  . Highest education level: Some college, no degree  Occupational History  . Not on file  Tobacco Use  . Smoking status: Current Every Day Smoker    Types: Cigars  . Smokeless tobacco: Never Used  . Tobacco comment: about  7 small cigars daily  Vaping Use  . Vaping Use: Never used  Substance and Sexual Activity  . Alcohol use: Yes    Alcohol/week: 3.0 standard drinks    Types: 2 Shots of liquor, 1 Cans of beer per week  . Drug use: No  . Sexual activity: Yes  Other Topics Concern  . Not on file  Social History Narrative  . Not on file   Social Determinants of Health   Financial Resource Strain:   . Difficulty of Paying Living Expenses:   Food Insecurity:   . Worried About Charity fundraiser in the Last Year:   . Arboriculturist in the Last Year:   Transportation Needs:   . Film/video editor (Medical):   Marland Kitchen Lack of Transportation (Non-Medical):   Physical Activity:   .  Days of Exercise per Week:   . Minutes of Exercise per Session:   Stress:   . Feeling of Stress :   Social Connections:   . Frequency of Communication with Friends and Family:   . Frequency of Social Gatherings with Friends and Family:   . Attends Religious Services:   . Active Member of Clubs or Organizations:   . Attends Archivist Meetings:   Marland Kitchen Marital Status:   Intimate Partner Violence:   . Fear of Current or Ex-Partner:   . Emotionally Abused:   Marland Kitchen Physically Abused:   . Sexually Abused:     Review of Systems  Constitutional: Negative for fatigue and unexpected weight change.  Eyes: Negative for visual disturbance.  Respiratory: Negative for cough, chest tightness and shortness of breath.   Cardiovascular: Negative for chest pain, palpitations and leg swelling.  Gastrointestinal: Negative for abdominal pain and blood in stool.  Neurological: Negative for dizziness, light-headedness and headaches.     Objective:   Vitals:   09/09/19 1344  BP: (!) 137/79  Pulse: 84  Temp: 98.1 F (36.7 C)  SpO2: 98%  Weight: (!) 273 lb (123.8 kg)  Height: 6' (1.829 m)     Physical Exam Vitals reviewed.  Constitutional:      Appearance: He is well-developed.  HENT:     Head: Normocephalic and atraumatic.  Eyes:     Pupils: Pupils are equal, round, and reactive to light.  Neck:     Vascular: No carotid bruit or JVD.  Cardiovascular:     Rate and Rhythm: Normal rate and regular rhythm.     Heart sounds: Normal heart sounds. No murmur heard.   Pulmonary:     Effort: Pulmonary effort is normal.     Breath sounds: Normal breath sounds. No rales.  Skin:    General: Skin is warm and dry.  Neurological:     Mental Status: He is alert and oriented to person, place, and time.    Results for orders placed or performed in visit on 09/09/19  POCT glycosylated hemoglobin (Hb A1C)  Result Value Ref Range   Hemoglobin A1C 5.7 (A) 4.0 - 5.6 %   HbA1c POC (<> result,  manual entry)     HbA1c, POC (prediabetic range)     HbA1c, POC (controlled diabetic range)       Assessment & Plan:  Jordan Hines is a 69 y.o. male . Type 2 diabetes mellitus with obesity ([place]) - Plan: POCT glycosylated hemoglobin (Hb A1C), Microalbumin / creatinine urine ratio, HM Diabetes Foot Exam  Class 2 severe obesity with serious comorbidity and body mass index (BMI)  of 35.0 to 35.9 in adult, unspecified obesity type ([place]) Commended on improved A1c.  Still encouraged to minimize sodas/sugar-containing beverages, careful with fast food and restaurant food, increase physical activity with low intensity exercise goals discussed.  Recheck 3 months.  No orders of the defined types were placed in this encounter.  Patient Instructions     Try to prepare more food yourself as you will be able to control what goes into it better.  Try to decrease sodas - water is best.  Be careful with too many fruits as those also have sugar - see info below.  Try to walk starting at 67min per day as initial goal - ultimate goal of 30 mins 5 days per week. Set reasonable goal initially.    Diabetes Mellitus and Nutrition, Adult When you have diabetes (diabetes mellitus), it is very important to have healthy eating habits because your blood sugar (glucose) levels are greatly affected by what you eat and drink. Eating healthy foods in the appropriate amounts, at about the same times every day, can help you:  Control your blood glucose.  Lower your risk of heart disease.  Improve your blood pressure.  Reach or maintain a healthy weight. Every person with diabetes is different, and each person has different needs for a meal plan. Your health care provider may recommend that you work with a diet and nutrition specialist (dietitian) to make a meal plan that is best for you. Your meal plan may vary depending on factors such as:  The calories you need.  The medicines you take.  Your  weight.  Your blood glucose, blood pressure, and cholesterol levels.  Your activity level.  Other health conditions you have, such as heart or kidney disease. How do carbohydrates affect me? Carbohydrates, also called carbs, affect your blood glucose level more than any other type of food. Eating carbs naturally raises the amount of glucose in your blood. Carb counting is a method for keeping track of how many carbs you eat. Counting carbs is important to keep your blood glucose at a healthy level, especially if you use insulin or take certain oral diabetes medicines. It is important to know how many carbs you can safely have in each meal. This is different for every person. Your dietitian can help you calculate how many carbs you should have at each meal and for each snack. Foods that contain carbs include:  Bread, cereal, rice, pasta, and crackers.  Potatoes and corn.  Peas, beans, and lentils.  Milk and yogurt.  Fruit and juice.  Desserts, such as cakes, cookies, ice cream, and candy. How does alcohol affect me? Alcohol can cause a sudden decrease in blood glucose (hypoglycemia), especially if you use insulin or take certain oral diabetes medicines. Hypoglycemia can be a life-threatening condition. Symptoms of hypoglycemia (sleepiness, dizziness, and confusion) are similar to symptoms of having too much alcohol. If your health care provider says that alcohol is safe for you, follow these guidelines:  Limit alcohol intake to no more than 1 drink per day for nonpregnant women and 2 drinks per day for men. One drink equals 12 oz of beer, 5 oz of wine, or 1 oz of hard liquor.  Do not drink on an empty stomach.  Keep yourself hydrated with water, diet soda, or unsweetened iced tea.  Keep in mind that regular soda, juice, and other mixers may contain a lot of sugar and must be counted as carbs. What are tips for following this plan?  Reading food labels  Start by checking the  serving size on the "Nutrition Facts" label of packaged foods and drinks. The amount of calories, carbs, fats, and other nutrients listed on the label is based on one serving of the item. Many items contain more than one serving per package.  Check the total grams (g) of carbs in one serving. You can calculate the number of servings of carbs in one serving by dividing the total carbs by 15. For example, if a food has 30 g of total carbs, it would be equal to 2 servings of carbs.  Check the number of grams (g) of saturated and trans fats in one serving. Choose foods that have low or no amount of these fats.  Check the number of milligrams (mg) of salt (sodium) in one serving. Most people should limit total sodium intake to less than 2,300 mg per day.  Always check the nutrition information of foods labeled as "low-fat" or "nonfat". These foods may be higher in added sugar or refined carbs and should be avoided.  Talk to your dietitian to identify your daily goals for nutrients listed on the label. Shopping  Avoid buying canned, premade, or processed foods. These foods tend to be high in fat, sodium, and added sugar.  Shop around the outside edge of the grocery store. This includes fresh fruits and vegetables, bulk grains, fresh meats, and fresh dairy. Cooking  Use low-heat cooking methods, such as baking, instead of high-heat cooking methods like deep frying.  Cook using healthy oils, such as olive, canola, or sunflower oil.  Avoid cooking with butter, cream, or high-fat meats. Meal planning  Eat meals and snacks regularly, preferably at the same times every day. Avoid going long periods of time without eating.  Eat foods high in fiber, such as fresh fruits, vegetables, beans, and whole grains. Talk to your dietitian about how many servings of carbs you can eat at each meal.  Eat 4-6 ounces (oz) of lean protein each day, such as lean meat, chicken, fish, eggs, or tofu. One oz of lean  protein is equal to: ? 1 oz of meat, chicken, or fish. ? 1 egg. ?  cup of tofu.  Eat some foods each day that contain healthy fats, such as avocado, nuts, seeds, and fish. Lifestyle  Check your blood glucose regularly.  Exercise regularly as told by your health care provider. This may include: ? 150 minutes of moderate-intensity or vigorous-intensity exercise each week. This could be brisk walking, biking, or water aerobics. ? Stretching and doing strength exercises, such as yoga or weightlifting, at least 2 times a week.  Take medicines as told by your health care provider.  Do not use any products that contain nicotine or tobacco, such as cigarettes and e-cigarettes. If you need help quitting, ask your health care provider.  Work with a Social worker or diabetes educator to identify strategies to manage stress and any emotional and social challenges. Questions to ask a health care provider  Do I need to meet with a diabetes educator?  Do I need to meet with a dietitian?  What number can I call if I have questions?  When are the best times to check my blood glucose? Where to find more information:  American Diabetes Association: diabetes.org  Academy of Nutrition and Dietetics: www.eatright.[employer] of Diabetes and Digestive and Kidney Diseases (NIH): [employer] Summary  A healthy meal plan will help you control your blood glucose and maintain a healthy  lifestyle.  Working with a diet and nutrition specialist (dietitian) can help you make a meal plan that is best for you.  Keep in mind that carbohydrates (carbs) and alcohol have immediate effects on your blood glucose levels. It is important to count carbs and to use alcohol carefully. This information is not intended to replace advice given to you by your health care provider. Make sure you discuss any questions you have with your health care provider. Document Revised: 01/09/2017 Document Reviewed:  03/03/2016 Elsevier Patient Education  [employer].   If you have lab work done today you will be contacted with your lab results within the next 2 weeks.  If you have not heard from [place] then please contact us. The fastest way to get your results is to register for My Chart.   IF you received an x-ray today, you will receive an invoice from [facility]. Please contact [facility] at [phone] with questions or concerns regarding your invoice.   IF you received labwork today, you will receive an invoice from [facility]. Please contact [facility] at [phone] with questions or concerns regarding your invoice.   Our billing staff will not be able to assist you with questions regarding bills from these companies.  You will be contacted with the lab results as soon as they are available. The fastest way to get your results is to activate your My Chart account. Instructions are located on the last page of this paperwork. If you have not heard from [place] regarding the results in 2 weeks, please contact this office.          Signed, Merri Ray, MD [facility]

## 2019-09-10 LAB — MICROALBUMIN / CREATININE URINE RATIO
Creatinine, Urine: 99.6 mg/dL
Microalb/Creat Ratio: 5 mg/g creat (ref 0–29)
Microalbumin, Urine: 5 ug/mL

## 2019-11-10 ENCOUNTER — Other Ambulatory Visit: Payer: Self-pay | Admitting: Family Medicine

## 2019-11-10 DIAGNOSIS — M109 Gout, unspecified: Secondary | ICD-10-CM

## 2019-11-10 DIAGNOSIS — I10 Essential (primary) hypertension: Secondary | ICD-10-CM

## 2019-11-21 ENCOUNTER — Ambulatory Visit: Payer: Medicare HMO | Admitting: Family Medicine

## 2019-11-21 ENCOUNTER — Ambulatory Visit (INDEPENDENT_AMBULATORY_CARE_PROVIDER_SITE_OTHER): Payer: Medicare HMO

## 2019-11-21 ENCOUNTER — Ambulatory Visit (INDEPENDENT_AMBULATORY_CARE_PROVIDER_SITE_OTHER): Payer: Medicare HMO | Admitting: Registered Nurse

## 2019-11-21 ENCOUNTER — Encounter: Payer: Self-pay | Admitting: Registered Nurse

## 2019-11-21 ENCOUNTER — Other Ambulatory Visit: Payer: Self-pay

## 2019-11-21 VITALS — BP 129/86 | HR 80 | Temp 98.1°F | Resp 18 | Ht 72.0 in | Wt 270.2 lb

## 2019-11-21 DIAGNOSIS — M25462 Effusion, left knee: Secondary | ICD-10-CM | POA: Diagnosis not present

## 2019-11-21 DIAGNOSIS — Z23 Encounter for immunization: Secondary | ICD-10-CM

## 2019-11-21 DIAGNOSIS — M25361 Other instability, right knee: Secondary | ICD-10-CM

## 2019-11-21 DIAGNOSIS — M25561 Pain in right knee: Secondary | ICD-10-CM | POA: Diagnosis not present

## 2019-11-21 DIAGNOSIS — M25362 Other instability, left knee: Secondary | ICD-10-CM | POA: Diagnosis not present

## 2019-11-21 DIAGNOSIS — M1712 Unilateral primary osteoarthritis, left knee: Secondary | ICD-10-CM | POA: Diagnosis not present

## 2019-11-21 NOTE — Patient Instructions (Signed)
° ° ° °  If you have lab work done today you will be contacted with your lab results within the next 2 weeks.  If you have not heard from us then please contact us. The fastest way to get your results is to register for My Chart. ° ° °IF you received an x-ray today, you will receive an invoice from [facility]. Please contact [facility] at [phone] with questions or concerns regarding your invoice.  ° °IF you received labwork today, you will receive an invoice from [facility]. Please contact [facility] at [phone] with questions or concerns regarding your invoice.  ° °Our billing staff will not be able to assist you with questions regarding bills from these companies. ° °You will be contacted with the lab results as soon as they are available. The fastest way to get your results is to activate your My Chart account. Instructions are located on the last page of this paperwork. If you have not heard from us regarding the results in 2 weeks, please contact this office. °  ° ° ° °

## 2019-11-21 NOTE — Progress Notes (Signed)
If we could call patient - No major arthritic changes. Let's do physical therapy and see what improvements we can get.  Thanks,  Kathrin Ruddy, NP

## 2019-11-22 ENCOUNTER — Telehealth: Payer: Self-pay

## 2019-11-22 DIAGNOSIS — M25362 Other instability, left knee: Secondary | ICD-10-CM

## 2019-11-22 DIAGNOSIS — M25361 Other instability, right knee: Secondary | ICD-10-CM

## 2019-11-22 NOTE — Telephone Encounter (Signed)
Pt. Returned Mei's message. PT. Confirmed a willingness to try physical therapy

## 2019-11-23 NOTE — Telephone Encounter (Signed)
I have called pt no answer. Attempted to inform him that we have placed the referral to PT so he should get a call in the next couple of weeks to get that scheduled.

## 2019-11-30 DIAGNOSIS — J338 Other polyp of sinus: Secondary | ICD-10-CM | POA: Diagnosis not present

## 2019-11-30 DIAGNOSIS — J310 Chronic rhinitis: Secondary | ICD-10-CM | POA: Diagnosis not present

## 2019-12-09 ENCOUNTER — Other Ambulatory Visit: Payer: Self-pay

## 2019-12-09 ENCOUNTER — Encounter: Payer: Self-pay | Admitting: Family Medicine

## 2019-12-09 ENCOUNTER — Ambulatory Visit (INDEPENDENT_AMBULATORY_CARE_PROVIDER_SITE_OTHER): Payer: Medicare HMO | Admitting: Family Medicine

## 2019-12-09 VITALS — BP 128/88 | HR 82 | Temp 98.2°F | Ht 72.0 in | Wt 269.0 lb

## 2019-12-09 DIAGNOSIS — Z6836 Body mass index (BMI) 36.0-36.9, adult: Secondary | ICD-10-CM

## 2019-12-09 DIAGNOSIS — E1169 Type 2 diabetes mellitus with other specified complication: Secondary | ICD-10-CM | POA: Diagnosis not present

## 2019-12-09 DIAGNOSIS — R400 Somnolence: Secondary | ICD-10-CM | POA: Diagnosis not present

## 2019-12-09 DIAGNOSIS — F32A Depression, unspecified: Secondary | ICD-10-CM | POA: Diagnosis not present

## 2019-12-09 DIAGNOSIS — R5383 Other fatigue: Secondary | ICD-10-CM

## 2019-12-09 DIAGNOSIS — R351 Nocturia: Secondary | ICD-10-CM | POA: Diagnosis not present

## 2019-12-09 DIAGNOSIS — E669 Obesity, unspecified: Secondary | ICD-10-CM

## 2019-12-09 DIAGNOSIS — I10 Essential (primary) hypertension: Secondary | ICD-10-CM | POA: Diagnosis not present

## 2019-12-09 MED ORDER — METOPROLOL SUCCINATE ER 25 MG PO TB24: 25.0000 mg | ORAL_TABLET | Freq: Every day | ORAL | 1 refills | Status: DC

## 2019-12-09 MED ORDER — LISINOPRIL 10 MG PO TABS: 10.0000 mg | ORAL_TABLET | Freq: Every day | ORAL | 1 refills | Status: DC

## 2019-12-09 MED ORDER — SERTRALINE HCL 50 MG PO TABS: ORAL_TABLET | ORAL | 2 refills | Status: DC

## 2019-12-09 NOTE — Patient Instructions (Addendum)
   I will check A1c today - keep up the good work with diet, exercise. Try to cut back on sodas as possible.   No change in medications today.  I will refer you to sleep specialist to evaluate for possible sleep apnea that could contribute to daytime sleepiness/fatigue and possibly nighttime urination.  Return to the clinic or go to the nearest emergency room if any of your symptoms worsen or new symptoms occur.    If you have lab work done today you will be contacted with your lab results within the next 2 weeks.  If you have not heard from [place] then please contact us. The fastest way to get your results is to register for My Chart.   IF you received an x-ray today, you will receive an invoice from [facility]. Please contact [facility] at [phone] with questions or concerns regarding your invoice.   IF you received labwork today, you will receive an invoice from [facility]. Please contact [facility] at [phone] with questions or concerns regarding your invoice.   Our billing staff will not be able to assist you with questions regarding bills from these companies.  You will be contacted with the lab results as soon as they are available. The fastest way to get your results is to activate your My Chart account. Instructions are located on the last page of this paperwork. If you have not heard from [place] regarding the results in 2 weeks, please contact this office.

## 2019-12-09 NOTE — Progress Notes (Signed)
Subjective:  Patient ID: Jordan Hines, male    DOB: 14-Aug-1950  Age: 69 y.o. MRN: [id number]  CC:  Chief Complaint  Patient presents with  . Follow-up    on diabetes and lab work. PT reports no issues with this condition since last OV.Pt reports doing his best to watch his sugar intake. PT isn't fasting currently. pt had a sweet tea and a bisket this morning.    HPI Jordan Hines presents for   Diabetes: With hyperglycemia, obesity., previous prediabetes then at diabetic level of 6.9 with A1c in April.  Has significantly improved his A1c at July visit 5.7.  Eating more vegetables, fruit at that time.  Continue to recommend decreased fast food/takeout/sugar-containing beverages. He has continued to lose weight, down 4 pounds from July visit. Still working on diet. Still some sodas.  Exercise - walking some 8k steps per day - phone app.   Wt Readings from Last 3 Encounters:  12/09/19 269 lb (122 kg)  11/21/19 270 lb 3.2 oz (122.6 kg)  09/09/19 (!) 273 lb (123.8 kg)  He is on ACE inhibitor, no current statin. Microalbumin: Normal ratio July 30  Lab Results  Component Value Date   HGBA1C 5.7 (A) 09/09/2019   HGBA1C 6.9 (A) 05/27/2019   HGBA1C 5.8 (H) 06/22/2018   Lab Results  Component Value Date   LDLCALC 87 06/22/2018   CREATININE 0.96 04/04/2019   Hypertension: Lisinopril 10 mg daily, Toprol-XL 25 mg daily. Home readings: 128/88. No new side effects of meds.   BP Readings from Last 3 Encounters:  12/09/19 128/88  11/21/19 129/86  09/09/19 (!) 137/79   Lab Results  Component Value Date   CREATININE 0.96 04/04/2019    Depression: Continues to take Zoloft 25 mg daily. Mood going ok. Some decreased energy at times.  Sleeping ok. Feels like needs to take nap - especially after meal. Lives by self, unknown if snoring. Some nocturia - 2-3 times at times - no recent changes - longstanding. No dysuria/urgency. Urology eval yearly - hx prostate CA - radical  prostatectomy 2011. Last urology appt 3/16.   Depression screen [facility] 2/9 12/09/2019 11/21/2019 09/09/2019 06/21/2019 06/10/2019  Decreased Interest 0 0 0 0 0  Down, Depressed, Hopeless 0 0 0 0 0  PHQ - 2 Score 0 0 0 0 0  Altered sleeping - - - - -  Tired, decreased energy - - - - -  Change in appetite - - - - -  Feeling bad or failure about yourself  - - - - -  Trouble concentrating - - - - -  Moving slowly or fidgety/restless - - - - -  Suicidal thoughts - - - - -  PHQ-9 Score - - - - -  Difficult doing work/chores - - - - -    Knee pain/tightness better with osteobiflex.   History Patient Active Problem List   Diagnosis Date Noted  . Acute gouty arthritis 02/26/2018  . Non-allergic rhinitis 01/06/2018   Past Medical History:  Diagnosis Date  . Anxiety   . Arthritis   . Cancer [facility]) 2011   Prostate cancer  sx in 2011  . Depression   . Deviated septum   . Hypertension   . Nasal turbinate hypertrophy    Past Surgical History:  Procedure Laterality Date  . COLONOSCOPY  2000   Dr.Orr "normal" per pt-no report   . NASAL SEPTOPLASTY W/ TURBINOPLASTY Bilateral 10/19/2018   Procedure: NASAL SEPTOPLASTY WITH  BILATERAL  TURBINATE REDUCTION;  Surgeon: [name], MD;  Location: [facility];  Service: ENT;  Laterality: Bilateral;  . PROSTATE SURGERY     No Known Allergies Prior to Admission medications   Medication Sig Start Date End Date Taking? Authorizing Provider  allopurinol (ZYLOPRIM) 100 MG tablet TAKE 1 TABLET EVERY DAY 11/10/19  Yes [name], MD  Bioflavonoid Products (BIOFLEX PO) Take by mouth.   Yes [provider]  colchicine 0.6 MG tablet Take 1 tablet (0.6 mg total) by mouth daily. 06/10/19  Yes [name], MD  fluticasone Asencion Islam) 50 MCG/ACT nasal spray  03/11/19  Yes [provider]  ipratropium (ATROVENT) 0.06 % nasal spray  11/30/19  Yes [provider]  lisinopril (ZESTRIL) 10 MG tablet TAKE 1 TABLET EVERY DAY  11/10/19  Yes [name], MD  metoprolol succinate (TOPROL-XL) 25 MG 24 hr tablet TAKE 1 TABLET EVERY DAY 11/10/19  Yes [name], MD  Multiple Vitamin (MULTIVITAMIN WITH MINERALS) TABS tablet Take 1 tablet by mouth daily.   Yes [provider]  sertraline (ZOLOFT) 50 MG tablet TAKE 1 TABLET EVERY DAY 06/10/19  Yes [name], MD  sildenafil (VIAGRA) 100 MG tablet Take 100 mg by mouth daily. 04/19/18  Yes [provider]  triamcinolone cream (KENALOG) 0.1 % Apply 1 application topically 2 (two) times daily. 02/02/19  Yes [name], MD   Social History   Socioeconomic History  . Marital status: Divorced    Spouse name: Not on file  . Number of children: 1  . Years of education: Not on file  . Highest education level: Some college, no degree  Occupational History  . Not on file  Tobacco Use  . Smoking status: Current Every Day Smoker    Types: Cigars  . Smokeless tobacco: Never Used  . Tobacco comment: about 7 small cigars daily  Vaping Use  . Vaping Use: Never used  Substance and Sexual Activity  . Alcohol use: Yes    Alcohol/week: 3.0 standard drinks    Types: 2 Shots of liquor, 1 Cans of beer per week  . Drug use: No  . Sexual activity: Yes  Other Topics Concern  . Not on file  Social History Narrative  . Not on file   Social Determinants of Health   Financial Resource Strain:   . Difficulty of Paying Living Expenses: Not on file  Food Insecurity:   . Worried About Charity fundraiser in the Last Year: Not on file  . Ran Out of Food in the Last Year: Not on file  Transportation Needs:   . Lack of Transportation (Medical): Not on file  . Lack of Transportation (Non-Medical): Not on file  Physical Activity:   . Days of Exercise per Week: Not on file  . Minutes of Exercise per Session: Not on file  Stress:   . Feeling of Stress : Not on file  Social Connections:   . Frequency of Communication with Friends and Family: Not on  file  . Frequency of Social Gatherings with Friends and Family: Not on file  . Attends Religious Services: Not on file  . Active Member of Clubs or Organizations: Not on file  . Attends Archivist Meetings: Not on file  . Marital Status: Not on file  Intimate Partner Violence:   . Fear of Current or Ex-Partner: Not on file  . Emotionally Abused: Not on file  . Physically Abused: Not on  file  . Sexually Abused: Not on file    Review of Systems  Constitutional: Negative for fatigue and unexpected weight change.  Eyes: Negative for visual disturbance.  Respiratory: Negative for cough, chest tightness and shortness of breath.   Cardiovascular: Negative for chest pain, palpitations and leg swelling.  Gastrointestinal: Negative for abdominal pain and blood in stool.  Neurological: Negative for dizziness, light-headedness and headaches.     Objective:   Vitals:   12/09/19 1318  BP: 128/88  Pulse: 82  Temp: 98.2 F (36.8 C)  TempSrc: Temporal  SpO2: 98%  Weight: 269 lb (122 kg)  Height: 6' (1.829 m)     Physical Exam Vitals reviewed.  Constitutional:      Appearance: He is well-developed.  HENT:     Head: Normocephalic and atraumatic.  Eyes:     Pupils: Pupils are equal, round, and reactive to light.  Neck:     Vascular: No carotid bruit or JVD.  Cardiovascular:     Rate and Rhythm: Normal rate and regular rhythm.     Heart sounds: Normal heart sounds. No murmur heard.   Pulmonary:     Effort: Pulmonary effort is normal.     Breath sounds: Normal breath sounds. No rales.  Abdominal:     General: Abdomen is flat.     Tenderness: There is no abdominal tenderness. There is no right CVA tenderness or left CVA tenderness.  Skin:    General: Skin is warm and dry.  Neurological:     Mental Status: He is alert and oriented to person, place, and time.        Assessment & Plan:  MAIKOL GRASSIA is a 69 y.o. male . Type 2 diabetes mellitus with obesity  ([place]) - Plan: Hemoglobin A1c, Lipid panel  -Commended on continued weight loss, previous A1c was significant provement.  No meds for now, check A1c.  Check lipids.  Essential hypertension - Plan: lisinopril (ZESTRIL) 10 MG tablet, metoprolol succinate (TOPROL-XL) 25 MG 24 hr tablet, Comprehensive metabolic panel  -  Stable, tolerating current regimen. Medications refilled. Labs pending as above.   Fatigue, unspecified type - Plan: Ambulatory referral to Sleep Studies Daytime somnolence - Plan: Ambulatory referral to Sleep Studies Nocturia - Plan: Ambulatory referral to Sleep Studies BMI 36.0-36.9,adult - Plan: Ambulatory referral to Sleep Studies  -Daytime somnolence and fatigue, few episodes of nocturia, chronic.  Suspicious for sleep apnea with his history of obesity.  Refer to sleep specialist for testing.  Has ongoing care with urologist, denies any recent changes in his nocturia.  Depression, unspecified depression type - Plan: sertraline (ZOLOFT) 50 MG tablet  -Stable, continue Zoloft same dose.  Meds ordered this encounter  Medications  . lisinopril (ZESTRIL) 10 MG tablet    Sig: Take 1 tablet (10 mg total) by mouth daily.    Dispense:  90 tablet    Refill:  1  . metoprolol succinate (TOPROL-XL) 25 MG 24 hr tablet    Sig: Take 1 tablet (25 mg total) by mouth daily.    Dispense:  90 tablet    Refill:  1  . sertraline (ZOLOFT) 50 MG tablet    Sig: TAKE 1 TABLET EVERY DAY    Dispense:  90 tablet    Refill:  2   Patient Instructions     I will check A1c today - keep up the good work with diet, exercise. Try to cut back on sodas as possible.   No change in medications  today.  I will refer you to sleep specialist to evaluate for possible sleep apnea that could contribute to daytime sleepiness/fatigue and possibly nighttime urination.  Return to the clinic or go to the nearest emergency room if any of your symptoms worsen or new symptoms occur.    If you have lab work done  today you will be contacted with your lab results within the next 2 weeks.  If you have not heard from [place] then please contact us. The fastest way to get your results is to register for My Chart.   IF you received an x-ray today, you will receive an invoice from [facility]. Please contact [facility] at [phone] with questions or concerns regarding your invoice.   IF you received labwork today, you will receive an invoice from [facility]. Please contact [facility] at [phone] with questions or concerns regarding your invoice.   Our billing staff will not be able to assist you with questions regarding bills from these companies.  You will be contacted with the lab results as soon as they are available. The fastest way to get your results is to activate your My Chart account. Instructions are located on the last page of this paperwork. If you have not heard from [place] regarding the results in 2 weeks, please contact this office.         Signed, Merri Ray, MD [facility]

## 2019-12-10 LAB — COMPREHENSIVE METABOLIC PANEL
ALT: 31 IU/L (ref 0–44)
AST: 17 IU/L (ref 0–40)
Albumin/Globulin Ratio: 1.4 (ref 1.2–2.2)
Albumin: 4 g/dL (ref 3.8–4.8)
Alkaline Phosphatase: 105 IU/L (ref 44–121)
BUN/Creatinine Ratio: 16 (ref 10–24)
BUN: 20 mg/dL (ref 8–27)
Bilirubin Total: 0.8 mg/dL (ref 0.0–1.2)
CO2: 27 mmol/L (ref 20–29)
Calcium: 9.6 mg/dL (ref 8.6–10.2)
Chloride: 98 mmol/L (ref 96–106)
Creatinine, Ser: 1.22 mg/dL (ref 0.76–1.27)
GFR calc Af Amer: 69 mL/min/{1.73_m2} (ref >59)
GFR calc non Af Amer: 60 mL/min/{1.73_m2} (ref >59)
Globulin, Total: 2.8 g/dL (ref 1.5–4.5)
Glucose: 124 mg/dL — ABNORMAL HIGH (ref 65–99)
Potassium: 4.1 mmol/L (ref 3.5–5.2)
Sodium: 138 mmol/L (ref 134–144)
Total Protein: 6.8 g/dL (ref 6.0–8.5)

## 2019-12-10 LAB — LIPID PANEL
Chol/HDL Ratio: 2.3 ratio (ref 0.0–5.0)
Cholesterol, Total: 183 mg/dL (ref 100–199)
HDL: 80 mg/dL (ref >39)
LDL Chol Calc (NIH): 78 mg/dL (ref 0–99)
Triglycerides: 150 mg/dL — ABNORMAL HIGH (ref 0–149)
VLDL Cholesterol Cal: 25 mg/dL (ref 5–40)

## 2019-12-10 LAB — HEMOGLOBIN A1C
Est. average glucose Bld gHb Est-mCnc: 131 mg/dL
Hgb A1c MFr Bld: 6.2 % — ABNORMAL HIGH (ref 4.8–5.6)

## 2019-12-12 ENCOUNTER — Encounter: Payer: Self-pay | Admitting: Allergy

## 2019-12-12 NOTE — Progress Notes (Signed)
Received note from ENT Dr. Deeann Saint office regarding 11/30/2019 office visit. Polyps noted bilaterally with postnasal drip. Treated with Medrol Dosepak and question regarding sinus CT in the future.

## 2019-12-21 DIAGNOSIS — J310 Chronic rhinitis: Secondary | ICD-10-CM | POA: Diagnosis not present

## 2019-12-21 DIAGNOSIS — J330 Polyp of nasal cavity: Secondary | ICD-10-CM | POA: Diagnosis not present

## 2020-01-04 ENCOUNTER — Encounter: Payer: Self-pay | Admitting: Allergy

## 2020-01-04 NOTE — Progress Notes (Signed)
Received notes from Dr. Deeann Saint office regarding 01/02/2020 office visit. Small polyp at the middle meatus on the right. Left polyp resolved. Continue with daily Flonase, saline irrigation and Atrovent nasal spray.

## 2020-01-16 ENCOUNTER — Encounter: Payer: Self-pay | Admitting: Registered Nurse

## 2020-01-16 NOTE — Progress Notes (Signed)
Established Patient Office Visit  Subjective:  Patient ID: Jordan Hines, male    DOB: Sep 01, 1950  Age: 68 y.o. MRN: [id number]  CC:  Chief Complaint  Patient presents with  . Knee Pain    patient states he has been having stiffness in left thigh and both knee. Patient states this has been going on for 4 months but after a brace , ointment nothing seems to help    HPI [name] presents for ongoing knee pain L knee extending towards thigh Has tried topicals, rest, and brace , no relief Has bene told he may have OA in the past Feels unstable at times. No falls.  Interested in further relief  No other concerns today  Past Medical History:  Diagnosis Date  . Anxiety   . Arthritis   . Cancer [facility]) 2011   Prostate cancer  sx in 2011  . Depression   . Deviated septum   . Hypertension   . Nasal turbinate hypertrophy     Past Surgical History:  Procedure Laterality Date  . COLONOSCOPY  2000   Dr.Orr "normal" per pt-no report   . NASAL SEPTOPLASTY W/ TURBINOPLASTY Bilateral 10/19/2018   Procedure: NASAL SEPTOPLASTY WITH  BILATERAL TURBINATE REDUCTION;  Surgeon: [name], MD;  Location: [facility];  Service: ENT;  Laterality: Bilateral;  . PROSTATE SURGERY      Family History  Problem Relation Age of Onset  . Heart disease Mother   . Asthma Mother   . Cancer Father   . Diabetes Father   . Colon polyps Brother   . Allergic rhinitis Neg Hx   . Eczema Neg Hx   . Urticaria Neg Hx   . Colon cancer Neg Hx   . Esophageal cancer Neg Hx   . Rectal cancer Neg Hx   . Stomach cancer Neg Hx     Social History   Socioeconomic History  . Marital status: Divorced    Spouse name: Not on file  . Number of children: 1  . Years of education: Not on file  . Highest education level: Some college, no degree  Occupational History  . Not on file  Tobacco Use  . Smoking status: Current Every Day Smoker    Types: Cigars  . Smokeless tobacco: Never Used  .  Tobacco comment: about 7 small cigars daily  Vaping Use  . Vaping Use: Never used  Substance and Sexual Activity  . Alcohol use: Yes    Alcohol/week: 3.0 standard drinks    Types: 2 Shots of liquor, 1 Cans of beer per week  . Drug use: No  . Sexual activity: Yes  Other Topics Concern  . Not on file  Social History Narrative  . Not on file   Social Determinants of Health   Financial Resource Strain:   . Difficulty of Paying Living Expenses: Not on file  Food Insecurity:   . Worried About Charity fundraiser in the Last Year: Not on file  . Ran Out of Food in the Last Year: Not on file  Transportation Needs:   . Lack of Transportation (Medical): Not on file  . Lack of Transportation (Non-Medical): Not on file  Physical Activity:   . Days of Exercise per Week: Not on file  . Minutes of Exercise per Session: Not on file  Stress:   . Feeling of Stress : Not on file  Social Connections:   . Frequency of Communication with Friends and Family:  Not on file  . Frequency of Social Gatherings with Friends and Family: Not on file  . Attends Religious Services: Not on file  . Active Member of Clubs or Organizations: Not on file  . Attends Archivist Meetings: Not on file  . Marital Status: Not on file  Intimate Partner Violence:   . Fear of Current or Ex-Partner: Not on file  . Emotionally Abused: Not on file  . Physically Abused: Not on file  . Sexually Abused: Not on file    Outpatient Medications Prior to Visit  Medication Sig Dispense Refill  . allopurinol (ZYLOPRIM) 100 MG tablet TAKE 1 TABLET EVERY DAY 90 tablet 1  . colchicine 0.6 MG tablet Take 1 tablet (0.6 mg total) by mouth daily. 30 tablet 1  . fluticasone (FLONASE) 50 MCG/ACT nasal spray     . sildenafil (VIAGRA) 100 MG tablet Take 100 mg by mouth daily.    Marland Kitchen triamcinolone cream (KENALOG) 0.1 % Apply 1 application topically 2 (two) times daily. 30 g 0  . lisinopril (ZESTRIL) 10 MG tablet TAKE 1 TABLET EVERY  DAY 90 tablet 0  . metoprolol succinate (TOPROL-XL) 25 MG 24 hr tablet TAKE 1 TABLET EVERY DAY 90 tablet 0  . sertraline (ZOLOFT) 50 MG tablet TAKE 1 TABLET EVERY DAY 90 tablet 2   No facility-administered medications prior to visit.    No Known Allergies  ROS Review of Systems Per hpi     Objective:    Physical Exam Constitutional:      General: He is not in acute distress.    Appearance: Normal appearance. He is normal weight. He is not ill-appearing, toxic-appearing or diaphoretic.  Cardiovascular:     Rate and Rhythm: Normal rate and regular rhythm.     Heart sounds: Normal heart sounds. No murmur heard.  No friction rub. No gallop.   Pulmonary:     Effort: Pulmonary effort is normal. No respiratory distress.     Breath sounds: Normal breath sounds. No stridor. No wheezing, rhonchi or rales.  Chest:     Chest wall: No tenderness.  Musculoskeletal:        General: Swelling and tenderness (joint line R knee) present. No deformity or signs of injury. Normal range of motion.     Right lower leg: No edema.     Left lower leg: No edema.     Comments: Negative anterior and posterior drawer. Negative mcmurray  Neurological:     General: No focal deficit present.     Mental Status: He is alert and oriented to person, place, and time. Mental status is at baseline.  Psychiatric:        Mood and Affect: Mood normal.        Behavior: Behavior normal.        Thought Content: Thought content normal.        Judgment: Judgment normal.     BP 129/86   Pulse 80   Temp 98.1 F (36.7 C) (Temporal)   Resp 18   Ht 6' (1.829 m)   Wt 270 lb 3.2 oz (122.6 kg)   SpO2 97%   BMI 36.65 kg/m  Wt Readings from Last 3 Encounters:  12/09/19 269 lb (122 kg)  11/21/19 270 lb 3.2 oz (122.6 kg)  09/09/19 (!) 273 lb (123.8 kg)     Health Maintenance Due  Topic Date Due  . TETANUS/TDAP  08/22/2018    There are no preventive care reminders to display for this  patient.  Lab Results    Component Value Date   TSH 1.730 04/04/2019   Lab Results  Component Value Date   WBC 9.4 05/27/2019   HGB 14.0 05/27/2019   HCT 43.5 (A) 05/27/2019   MCV 91.0 05/27/2019   PLT 243 04/04/2019   Lab Results  Component Value Date   NA 138 12/09/2019   K 4.1 12/09/2019   CO2 27 12/09/2019   GLUCOSE 124 (H) 12/09/2019   BUN 20 12/09/2019   CREATININE 1.22 12/09/2019   BILITOT 0.8 12/09/2019   ALKPHOS 105 12/09/2019   AST 17 12/09/2019   ALT 31 12/09/2019   PROT 6.8 12/09/2019   ALBUMIN 4.0 12/09/2019   CALCIUM 9.6 12/09/2019   Lab Results  Component Value Date   CHOL 183 12/09/2019   Lab Results  Component Value Date   HDL 80 12/09/2019   Lab Results  Component Value Date   LDLCALC 78 12/09/2019   Lab Results  Component Value Date   TRIG 150 (H) 12/09/2019   Lab Results  Component Value Date   CHOLHDL 2.3 12/09/2019   Lab Results  Component Value Date   HGBA1C 6.2 (H) 12/09/2019      Assessment & Plan:   Problem List Items Addressed This Visit    None    Visit Diagnoses    Flu vaccine need    -  Primary   Relevant Orders   Flu Vaccine QUAD High Dose(Fluad) (Completed)   Knee instability, left       Relevant Orders   DG Knee Complete 4 Views Left (Completed)   Knee instability, right       Relevant Orders   DG Knee Complete 4 Views Right (Completed)      No orders of the defined types were placed in this encounter.   Follow-up: No follow-ups on file.   PLAN  Dg knee shows no acute changes from previous study  Will pursue PT for strengthening and flexibility  Can consider ortho in the future  Continue brace and OTCs  Patient encouraged to call clinic with any questions, comments, or concerns.   Maximiano Coss, NP

## 2020-03-19 ENCOUNTER — Other Ambulatory Visit: Payer: Self-pay | Admitting: Family Medicine

## 2020-03-19 DIAGNOSIS — M109 Gout, unspecified: Secondary | ICD-10-CM

## 2020-03-21 DIAGNOSIS — J310 Chronic rhinitis: Secondary | ICD-10-CM | POA: Diagnosis not present

## 2020-03-21 DIAGNOSIS — J330 Polyp of nasal cavity: Secondary | ICD-10-CM | POA: Diagnosis not present

## 2020-04-24 ENCOUNTER — Ambulatory Visit (INDEPENDENT_AMBULATORY_CARE_PROVIDER_SITE_OTHER): Payer: Medicare HMO | Admitting: Registered Nurse

## 2020-04-24 ENCOUNTER — Encounter: Payer: Self-pay | Admitting: Registered Nurse

## 2020-04-24 ENCOUNTER — Other Ambulatory Visit: Payer: Self-pay

## 2020-04-24 VITALS — BP 123/84 | HR 82 | Temp 98.0°F | Resp 18 | Ht 72.0 in | Wt 265.2 lb

## 2020-04-24 DIAGNOSIS — R350 Frequency of micturition: Secondary | ICD-10-CM

## 2020-04-24 LAB — POCT URINALYSIS DIP (CLINITEK)
Bilirubin, UA: NEGATIVE
Blood, UA: NEGATIVE
Glucose, UA: NEGATIVE mg/dL
Ketones, POC UA: NEGATIVE mg/dL
Nitrite, UA: NEGATIVE
POC PROTEIN,UA: NEGATIVE
Spec Grav, UA: 1.02 (ref 1.010–1.025)
Urobilinogen, UA: 1 E.U./dL
pH, UA: 5.5 (ref 5.0–8.0)

## 2020-04-24 MED ORDER — CIPROFLOXACIN HCL 500 MG PO TABS: 500.0000 mg | ORAL_TABLET | Freq: Two times a day (BID) | ORAL | 0 refills | Status: DC

## 2020-04-24 NOTE — Progress Notes (Signed)
Acute Office Visit  Subjective:    Patient ID: Jordan Hines, male    DOB: 1950-11-09, 70 y.o.   MRN: [id number]  Chief Complaint  Patient presents with  . Urinary Tract Infection    Patient states he is here because he has been experiencing some pain while urinating and has always urinates frequently and has a strong smell since last friday.    HPI Patient is in today for dysuria  Ongoing for 4-5 days  Accompanied by malodorous urine, some chills and aches No cognitive changes, flank pain, nvd, or fevers No gross hematuria Has been nearly 1 year since last UTI Admits sugary beverages daily, unsure if this is impacting UTI risk  Past Medical History:  Diagnosis Date  . Anxiety   . Arthritis   . Cancer [facility]) 2011   Prostate cancer  sx in 2011  . Depression   . Deviated septum   . Hypertension   . Nasal turbinate hypertrophy     Past Surgical History:  Procedure Laterality Date  . COLONOSCOPY  2000   Dr.Orr "normal" per pt-no report   . NASAL SEPTOPLASTY W/ TURBINOPLASTY Bilateral 10/19/2018   Procedure: NASAL SEPTOPLASTY WITH  BILATERAL TURBINATE REDUCTION;  Surgeon: [name], MD;  Location: [facility];  Service: ENT;  Laterality: Bilateral;  . PROSTATE SURGERY      Family History  Problem Relation Age of Onset  . Heart disease Mother   . Asthma Mother   . Cancer Father   . Diabetes Father   . Colon polyps Brother   . Allergic rhinitis Neg Hx   . Eczema Neg Hx   . Urticaria Neg Hx   . Colon cancer Neg Hx   . Esophageal cancer Neg Hx   . Rectal cancer Neg Hx   . Stomach cancer Neg Hx     Social History   Socioeconomic History  . Marital status: Divorced    Spouse name: Not on file  . Number of children: 1  . Years of education: Not on file  . Highest education level: Some college, no degree  Occupational History  . Not on file  Tobacco Use  . Smoking status: Current Every Day Smoker    Types: Cigars  . Smokeless tobacco: Never  Used  . Tobacco comment: about 7 small cigars daily  Vaping Use  . Vaping Use: Never used  Substance and Sexual Activity  . Alcohol use: Yes    Alcohol/week: 3.0 standard drinks    Types: 2 Shots of liquor, 1 Cans of beer per week  . Drug use: No  . Sexual activity: Yes  Other Topics Concern  . Not on file  Social History Narrative  . Not on file   Social Determinants of Health   Financial Resource Strain: Not on file  Food Insecurity: Not on file  Transportation Needs: Not on file  Physical Activity: Not on file  Stress: Not on file  Social Connections: Not on file  Intimate Partner Violence: Not on file    Outpatient Medications Prior to Visit  Medication Sig Dispense Refill  . allopurinol (ZYLOPRIM) 100 MG tablet TAKE 1 TABLET EVERY DAY 90 tablet 0  . Bioflavonoid Products (BIOFLEX PO) Take by mouth.    . fluticasone (FLONASE) 50 MCG/ACT nasal spray     . ipratropium (ATROVENT) 0.06 % nasal spray     . lisinopril (ZESTRIL) 10 MG tablet Take 1 tablet (10 mg total) by mouth daily. 90 tablet  1  . metoprolol succinate (TOPROL-XL) 25 MG 24 hr tablet Take 1 tablet (25 mg total) by mouth daily. 90 tablet 1  . Multiple Vitamin (MULTIVITAMIN WITH MINERALS) TABS tablet Take 1 tablet by mouth daily.    . sertraline (ZOLOFT) 50 MG tablet TAKE 1 TABLET EVERY DAY 90 tablet 2  . sildenafil (VIAGRA) 100 MG tablet Take 100 mg by mouth daily.    . colchicine 0.6 MG tablet Take 1 tablet (0.6 mg total) by mouth daily. (Patient not taking: Reported on 04/24/2020) 30 tablet 1  . triamcinolone cream (KENALOG) 0.1 % Apply 1 application topically 2 (two) times daily. (Patient not taking: Reported on 04/24/2020) 30 g 0   No facility-administered medications prior to visit.    No Known Allergies  Review of Systems Per hpi      Objective:    Physical Exam Vitals and nursing note reviewed.  Constitutional:      Appearance: Normal appearance.  Cardiovascular:     Rate and Rhythm: Normal  rate and regular rhythm.     Pulses: Normal pulses.     Heart sounds: Normal heart sounds. No murmur heard. No friction rub. No gallop.   Pulmonary:     Effort: Pulmonary effort is normal. No respiratory distress.     Breath sounds: Normal breath sounds. No stridor. No wheezing, rhonchi or rales.  Neurological:     General: No focal deficit present.     Mental Status: He is alert. Mental status is at baseline.  Psychiatric:        Mood and Affect: Mood normal.        Behavior: Behavior normal.        Thought Content: Thought content normal.        Judgment: Judgment normal.     BP 123/84   Pulse 82   Temp 98 F (36.7 C) (Temporal)   Resp 18   Ht 6' (1.829 m)   Wt 265 lb 3.2 oz (120.3 kg)   SpO2 100%   BMI 35.97 kg/m  Wt Readings from Last 3 Encounters:  04/24/20 265 lb 3.2 oz (120.3 kg)  12/09/19 269 lb (122 kg)  11/21/19 270 lb 3.2 oz (122.6 kg)    There are no preventive care reminders to display for this patient.  There are no preventive care reminders to display for this patient.   Lab Results  Component Value Date   TSH 1.730 04/04/2019   Lab Results  Component Value Date   WBC 9.4 05/27/2019   HGB 14.0 05/27/2019   HCT 43.5 (A) 05/27/2019   MCV 91.0 05/27/2019   PLT 243 04/04/2019   Lab Results  Component Value Date   NA 138 12/09/2019   K 4.1 12/09/2019   CO2 27 12/09/2019   GLUCOSE 124 (H) 12/09/2019   BUN 20 12/09/2019   CREATININE 1.22 12/09/2019   BILITOT 0.8 12/09/2019   ALKPHOS 105 12/09/2019   AST 17 12/09/2019   ALT 31 12/09/2019   PROT 6.8 12/09/2019   ALBUMIN 4.0 12/09/2019   CALCIUM 9.6 12/09/2019   Lab Results  Component Value Date   CHOL 183 12/09/2019   Lab Results  Component Value Date   HDL 80 12/09/2019   Lab Results  Component Value Date   LDLCALC 78 12/09/2019   Lab Results  Component Value Date   TRIG 150 (H) 12/09/2019   Lab Results  Component Value Date   CHOLHDL 2.3 12/09/2019   Lab Results   Component  Value Date   HGBA1C 6.2 (H) 12/09/2019       Assessment & Plan:   Problem List Items Addressed This Visit   None   Visit Diagnoses    Urine frequency    -  Primary   Relevant Medications   ciprofloxacin (CIPRO) 500 MG tablet   Other Relevant Orders   POCT URINALYSIS DIP (CLINITEK) (Completed)       Meds ordered this encounter  Medications  . ciprofloxacin (CIPRO) 500 MG tablet    Sig: Take 1 tablet (500 mg total) by mouth 2 (two) times daily.    Dispense:  14 tablet    Refill:  0    Order Specific Question:   Supervising Provider    Answer:   Carlota Raspberry, JEFFREY R [[id number]]   PLAN  poct UA shows leukocytes, no nitrites, no glucose.  Will treat as UTI given history and unfortunate fact that we do not have ability to collect urine culture today.   Cipro 500mg  PO bid x 7 days  Discussed healthy diet and lifestyle but no glucose in urine is encouraging  He does have a follow up for chronic conditions with PCP in around 6 weeks  Patient encouraged to call clinic with any questions, comments, or concerns.   Maximiano Coss, NP

## 2020-04-24 NOTE — Patient Instructions (Addendum)
Mr. Jordan Hines -   Good to see you today. White blood cells in the urine may indicate UTI. Let's treat with cirpofloxacin 500mg  by mouth twice daily for one week. Please call if your symptoms get worse or fail to improve. Stay well hydrated with water during this week. Take it easy from an activity perspective for this course.  Feel free to contact Outpatient Rehab for physical therapy. The referral should still be active.  Looking forward to seeing you in [name], NP    If you have lab work done today you will be contacted with your lab results within the next 2 weeks.  If you have not heard from [place] then please contact us. The fastest way to get your results is to register for My Chart.   IF you received an x-ray today, you will receive an invoice from [facility]. Please contact [facility] at [phone] with questions or concerns regarding your invoice.   IF you received labwork today, you will receive an invoice from [place]. Please contact [facility] at [phone] with questions or concerns regarding your invoice.   Our billing staff will not be able to assist you with questions regarding bills from these companies.  You will be contacted with the lab results as soon as they are available. The fastest way to get your results is to activate your My Chart account. Instructions are located on the last page of this paperwork. If you have not heard from [place] regarding the results in 2 weeks, please contact this office.

## 2020-06-09 ENCOUNTER — Other Ambulatory Visit: Payer: Self-pay | Admitting: Family Medicine

## 2020-06-09 DIAGNOSIS — I10 Essential (primary) hypertension: Secondary | ICD-10-CM

## 2020-06-09 DIAGNOSIS — M109 Gout, unspecified: Secondary | ICD-10-CM

## 2020-06-13 ENCOUNTER — Ambulatory Visit (INDEPENDENT_AMBULATORY_CARE_PROVIDER_SITE_OTHER): Payer: Medicare HMO | Admitting: Family Medicine

## 2020-06-13 ENCOUNTER — Encounter: Payer: Self-pay | Admitting: Family Medicine

## 2020-06-13 ENCOUNTER — Other Ambulatory Visit: Payer: Self-pay

## 2020-06-13 VITALS — BP 110/68 | HR 77 | Temp 98.1°F | Ht 72.0 in | Wt 267.2 lb

## 2020-06-13 DIAGNOSIS — M7632 Iliotibial band syndrome, left leg: Secondary | ICD-10-CM

## 2020-06-13 DIAGNOSIS — R5383 Other fatigue: Secondary | ICD-10-CM

## 2020-06-13 DIAGNOSIS — J310 Chronic rhinitis: Secondary | ICD-10-CM | POA: Diagnosis not present

## 2020-06-13 DIAGNOSIS — I10 Essential (primary) hypertension: Secondary | ICD-10-CM | POA: Diagnosis not present

## 2020-06-13 DIAGNOSIS — R400 Somnolence: Secondary | ICD-10-CM

## 2020-06-13 DIAGNOSIS — M25669 Stiffness of unspecified knee, not elsewhere classified: Secondary | ICD-10-CM

## 2020-06-13 DIAGNOSIS — E669 Obesity, unspecified: Secondary | ICD-10-CM

## 2020-06-13 DIAGNOSIS — F32A Depression, unspecified: Secondary | ICD-10-CM

## 2020-06-13 DIAGNOSIS — M109 Gout, unspecified: Secondary | ICD-10-CM | POA: Diagnosis not present

## 2020-06-13 DIAGNOSIS — E1169 Type 2 diabetes mellitus with other specified complication: Secondary | ICD-10-CM | POA: Diagnosis not present

## 2020-06-13 MED ORDER — METOPROLOL SUCCINATE ER 25 MG PO TB24
1.0000 | ORAL_TABLET | Freq: Every day | ORAL | 1 refills | Status: DC
Start: 2020-06-13 — End: 2021-03-18

## 2020-06-13 MED ORDER — SERTRALINE HCL 50 MG PO TABS: ORAL_TABLET | ORAL | 2 refills | Status: DC

## 2020-06-13 MED ORDER — ALLOPURINOL 100 MG PO TABS: 100.0000 mg | ORAL_TABLET | Freq: Every day | ORAL | 2 refills | Status: DC

## 2020-06-13 MED ORDER — LISINOPRIL 10 MG PO TABS: 1.0000 | ORAL_TABLET | Freq: Every day | ORAL | 1 refills | Status: DC

## 2020-06-13 NOTE — Progress Notes (Signed)
Subjective:  Patient ID: VISHWA DAIS, male    DOB: [birth year]  Age: 70 y.o. MRN: [id number]  CC:  Chief Complaint  Patient presents with  . Referral    Sleep Apnea PT Knee  . Diabetes    HPI ADAMS HINCH presents for   Diabetes: With hyperglycemia, obesity.  Previous prediabetes than increased to diabetic level of 6.9 in April 2021.  Improved since that time, continued diet approach and commended on previous weight loss.  No current meds. Energy drink - 1 per day.  Microalbumin: Normal ratio September 09, 2019 Wt Readings from Last 3 Encounters:  06/13/20 267 lb 3.2 oz (121.2 kg)  04/24/20 265 lb 3.2 oz (120.3 kg)  12/09/19 269 lb (122 kg)     Lab Results  Component Value Date   HGBA1C 6.2 (H) 12/09/2019   HGBA1C 5.7 (A) 09/09/2019   HGBA1C 6.9 (A) 05/27/2019   Lab Results  Component Value Date   LDLCALC 78 12/09/2019   CREATININE 1.22 12/09/2019   Hypertension: No new side effects of meds.  Lisinopril 10mg  qd, toprol XL 25mg  qd.  Home readings: 120/80 BP Readings from Last 3 Encounters:  06/13/20 110/68  04/24/20 123/84  12/09/19 128/88   Lab Results  Component Value Date   CREATININE 1.22 12/09/2019   Depression: Zoloft 25 mg daily.  Fatigue, daytime somnolence discussed at his last visit along with nocturia.  Referred to sleep specialist for possible obstructive sleep apnea.  Referral notes reviewed, appears messages were left for him to schedule consult.  Not feeing depressed. Still tired during the day. Plans to follow up with sleep specialist.   Depression screen [facility] 2/9 04/24/2020 12/09/2019 11/21/2019 09/09/2019 06/21/2019  Decreased Interest 0 0 0 0 0  Down, Depressed, Hopeless 0 0 0 0 0  PHQ - 2 Score 0 0 0 0 0  Altered sleeping - - - - -  Tired, decreased energy - - - - -  Change in appetite - - - - -  Feeling bad or failure about yourself  - - - - -  Trouble concentrating - - - - -  Moving slowly or fidgety/restless - - - - -  Suicidal  thoughts - - - - -  PHQ-9 Score - - - - -  Difficult doing work/chores - - - - -  Some recent data might be hidden    Knee pain Discussed in October 2021 with my colleague.  Left knee x-ray was questionable small knee joint effusion, mild medial compartment degenerative changes on 11/21/2019.  right knee x-ray negative on 11/21/2019 x-ray at that time without acute changes from previous study.  Referred to physical therapy for strengthening and flexibility - he did not go.  Still stiffness in outer left thigh bilateral knees at times.  Symptoms for approximately past 8 months.  No known injury.  No change in activity.  He does do some walking for activity/exercise.  No current treatments.  Chronic rhinitis Followed by ENT, Dr. Benjamine Mola.  Episodic sneezing, rhinitis.  Does not feel like Flonase was effective, even with 2 sprays QD.  Has also been prescribed Atrovent nasal spray- not sure he tried it.  No current antihistamine.   Gout: Last flare:none recent Daily meds:allopurinol - no new side effects.  Lab Results  Component Value Date   LABURIC 5.8 08/20/2018    History Patient Active Problem List   Diagnosis Date Noted  . Acute gouty arthritis 02/26/2018  . Non-allergic rhinitis 01/06/2018  Past Medical History:  Diagnosis Date  . Anxiety   . Arthritis   . Cancer [facility]) 2011   Prostate cancer  sx in 2011  . Depression   . Deviated septum   . Hypertension   . Nasal turbinate hypertrophy    Past Surgical History:  Procedure Laterality Date  . COLONOSCOPY  2000   Dr.Orr "normal" per pt-no report   . NASAL SEPTOPLASTY W/ TURBINOPLASTY Bilateral 10/19/2018   Procedure: NASAL SEPTOPLASTY WITH  BILATERAL TURBINATE REDUCTION;  Surgeon: [name], MD;  Location: [facility];  Service: ENT;  Laterality: Bilateral;  . PROSTATE SURGERY     No Known Allergies Prior to Admission medications   Medication Sig Start Date End Date Taking? Authorizing Provider  allopurinol  (ZYLOPRIM) 100 MG tablet TAKE 1 TABLET EVERY DAY 06/11/20   [name], MD  Bioflavonoid Products (BIOFLEX PO) Take by mouth.    [provider]  ciprofloxacin (CIPRO) 500 MG tablet Take 1 tablet (500 mg total) by mouth 2 (two) times daily. 04/24/20   Maximiano Coss, NP  colchicine 0.6 MG tablet Take 1 tablet (0.6 mg total) by mouth daily. Patient not taking: Reported on 04/24/2020 06/10/19   [name], MD  fluticasone Asencion Islam) 50 MCG/ACT nasal spray  03/11/19   [provider]  ipratropium (ATROVENT) 0.06 % nasal spray  11/30/19   [provider]  lisinopril (ZESTRIL) 10 MG tablet TAKE 1 TABLET EVERY DAY 06/11/20   [name], MD  metoprolol succinate (TOPROL-XL) 25 MG 24 hr tablet TAKE 1 TABLET EVERY DAY 06/11/20   [name], MD  Multiple Vitamin (MULTIVITAMIN WITH MINERALS) TABS tablet Take 1 tablet by mouth daily.    [provider]  sertraline (ZOLOFT) 50 MG tablet TAKE 1 TABLET EVERY DAY 12/09/19   [name], MD  sildenafil (VIAGRA) 100 MG tablet Take 100 mg by mouth daily. 04/19/18   [provider]  triamcinolone cream (KENALOG) 0.1 % Apply 1 application topically 2 (two) times daily. Patient not taking: Reported on 04/24/2020 02/02/19   [name], MD   Social History   Socioeconomic History  . Marital status: Divorced    Spouse name: Not on file  . Number of children: 1  . Years of education: Not on file  . Highest education level: Some college, no degree  Occupational History  . Not on file  Tobacco Use  . Smoking status: Current Every Day Smoker    Types: Cigars  . Smokeless tobacco: Never Used  . Tobacco comment: about 7 small cigars daily  Vaping Use  . Vaping Use: Never used  Substance and Sexual Activity  . Alcohol use: Yes    Alcohol/week: 3.0 standard drinks    Types: 2 Shots of liquor, 1 Cans of beer per week  . Drug use: No  . Sexual activity: Yes  Other Topics Concern  . Not on  file  Social History Narrative  . Not on file   Social Determinants of Health   Financial Resource Strain: Not on file  Food Insecurity: Not on file  Transportation Needs: Not on file  Physical Activity: Not on file  Stress: Not on file  Social Connections: Not on file  Intimate Partner Violence: Not on file    Review of Systems  Constitutional: Positive for fatigue. Negative for unexpected weight change.  Eyes: Negative for visual disturbance.  Respiratory: Negative for cough, chest tightness and shortness of breath.  Cardiovascular: Negative for chest pain, palpitations and leg swelling.  Gastrointestinal: Negative for abdominal pain and blood in stool.  Neurological: Negative for dizziness, light-headedness and headaches.     Objective:   Vitals:   06/13/20 1406  BP: 110/68  Pulse: 77  Temp: 98.1 F (36.7 C)  SpO2: 97%  Weight: 267 lb 3.2 oz (121.2 kg)  Height: 6' (1.829 m)     Physical Exam Vitals reviewed.  Constitutional:      Appearance: He is well-developed. He is obese.  HENT:     Head: Normocephalic and atraumatic.  Eyes:     Pupils: Pupils are equal, round, and reactive to light.  Neck:     Vascular: No carotid bruit or JVD.  Cardiovascular:     Rate and Rhythm: Normal rate and regular rhythm.     Heart sounds: Normal heart sounds. No murmur heard.   Pulmonary:     Effort: Pulmonary effort is normal.     Breath sounds: Normal breath sounds. No rales.  Musculoskeletal:     Comments: R knee - from no bony ttp, neg mcmuray, varus, valgus.  L knee - from, no bony ttp. Notes anterior knee as prior area of discomfort . Patella, patellar tendon nontender. Tight itband with discomfort on ober testing. Reproduces soreness on lateral thigh. No bony ttp.   Skin:    General: Skin is warm and dry.  Neurological:     Mental Status: He is alert and oriented to person, place, and time.        Assessment & Plan:  GAEL LONDO is a 70 y.o. male  . Type 2 diabetes mellitus with obesity ([place]) - Plan: Hemoglobin A1c, Lipid panel  - check A1c, diet/exercise for weight management.  Essential hypertension - Plan: COMPLETE METABOLIC PANEL WITH GFR, lisinopril (ZESTRIL) 10 MG tablet, metoprolol succinate (TOPROL-XL) 25 MG 24 hr tablet  -Stable with current regimen, continue same  Fatigue, unspecified type Daytime somnolence  -Still suspicious for obstructive sleep apnea, number provided again for previous referral to sleep specialist, can place new referral if needed.  Depression, unspecified depression type - Plan: sertraline (ZOLOFT) 50 MG tablet  -Stable, continue same dose Zoloft  Chronic rhinitis  -Option of Claritin to add to steroid nasal spray, or Atrovent nasal spray as previously prescribed.  Follow-up with ENT if persistent symptoms.  Knee stiffness, unspecified laterality - Plan: Ambulatory referral to Physical Therapy Iliotibial band syndrome of left side  -Suspect component of degenerative joint disease as well as IT band on the left.  Refer to physical therapy.  May need orthopedic eval.  Gout, unspecified cause, unspecified chronicity, unspecified site - Plan: Uric acid, allopurinol (ZYLOPRIM) 100 MG tablet  -Stable, controlled with allopurinol, check uric acid.  Meds ordered this encounter  Medications  . allopurinol (ZYLOPRIM) 100 MG tablet    Sig: Take 1 tablet (100 mg total) by mouth daily.    Dispense:  90 tablet    Refill:  2  . lisinopril (ZESTRIL) 10 MG tablet    Sig: Take 1 tablet (10 mg total) by mouth daily.    Dispense:  90 tablet    Refill:  1  . metoprolol succinate (TOPROL-XL) 25 MG 24 hr tablet    Sig: Take 1 tablet (25 mg total) by mouth daily.    Dispense:  90 tablet    Refill:  1  . sertraline (ZOLOFT) 50 MG tablet    Sig: TAKE 1 TABLET EVERY DAY  Dispense:  90 tablet    Refill:  2   Patient Instructions  I do think it's a good idea to get checked for sleep apnea. I did refer you to  the sleep specialist last fall.  That referral may still be good.  It appears they tried to call you.  Here is their contact information.  If they need a new referral, let me know: [facility] Address: [address] [place], [place] [address] Phone: [phone]  Try otc claritin if needed for allergies. Ipratropium nasal spray also an option.  If not helping, talk to your ENT specialist.   I will refer you to physical therapy again.   No med changes otherwise at this time       Signed, Merri Ray, MD [facility]

## 2020-06-13 NOTE — Patient Instructions (Addendum)
I do think it's a good idea to get checked for sleep apnea. I did refer you to the sleep specialist last fall.  That referral may still be good.  It appears they tried to call you.  Here is their contact information.  If they need a new referral, let me know: [facility] Address: [address] [place], [place] [address] Phone: [phone]  Try otc claritin if needed for allergies. Ipratropium nasal spray also an option.  If not helping, talk to your ENT specialist.   I will refer you to physical therapy again.   No med changes otherwise at this time

## 2020-06-14 LAB — COMPLETE METABOLIC PANEL WITH GFR
AG Ratio: 1.5 (calc) (ref 1.0–2.5)
ALT: 19 U/L (ref 9–46)
AST: 22 U/L (ref 10–35)
Albumin: 4.1 g/dL (ref 3.6–5.1)
Alkaline phosphatase (APISO): 86 U/L (ref 35–144)
BUN/Creatinine Ratio: 14 (calc) (ref 6–22)
BUN: 17 mg/dL (ref 7–25)
CO2: 28 mmol/L (ref 20–32)
Calcium: 9.4 mg/dL (ref 8.6–10.3)
Chloride: 102 mmol/L (ref 98–110)
Creat: 1.21 mg/dL — ABNORMAL HIGH (ref 0.70–1.18)
GFR, Est African American: 69 mL/min/{1.73_m2} (ref >=60)
GFR, Est Non African American: 60 mL/min/{1.73_m2} (ref >=60)
Globulin: 2.7 g/dL (calc) (ref 1.9–3.7)
Glucose, Bld: 72 mg/dL (ref 65–99)
Potassium: 4.5 mmol/L (ref 3.5–5.3)
Sodium: 139 mmol/L (ref 135–146)
Total Bilirubin: 0.6 mg/dL (ref 0.2–1.2)
Total Protein: 6.8 g/dL (ref 6.1–8.1)

## 2020-06-14 LAB — LIPID PANEL
Cholesterol: 186 mg/dL (ref 0–200)
HDL: 66.7 mg/dL (ref >39.00)
LDL Cholesterol: 87 mg/dL (ref 0–99)
NonHDL: 119.49
Total CHOL/HDL Ratio: 3
Triglycerides: 162 mg/dL — ABNORMAL HIGH (ref 0.0–149.0)
VLDL: 32.4 mg/dL (ref 0.0–40.0)

## 2020-06-14 LAB — URIC ACID: Uric Acid, Serum: 6 mg/dL (ref 4.0–7.8)

## 2020-06-14 LAB — HEMOGLOBIN A1C: Hgb A1c MFr Bld: 6 % (ref 4.6–6.5)

## 2020-08-14 DIAGNOSIS — Z8546 Personal history of malignant neoplasm of prostate: Secondary | ICD-10-CM | POA: Diagnosis not present

## 2020-08-20 DIAGNOSIS — R351 Nocturia: Secondary | ICD-10-CM | POA: Diagnosis not present

## 2020-08-20 DIAGNOSIS — N5231 Erectile dysfunction following radical prostatectomy: Secondary | ICD-10-CM | POA: Diagnosis not present

## 2020-08-20 DIAGNOSIS — Z8546 Personal history of malignant neoplasm of prostate: Secondary | ICD-10-CM | POA: Diagnosis not present

## 2020-08-28 ENCOUNTER — Other Ambulatory Visit: Payer: Self-pay

## 2020-08-28 ENCOUNTER — Ambulatory Visit: Payer: Medicare HMO | Attending: Family Medicine

## 2020-08-28 DIAGNOSIS — M25662 Stiffness of left knee, not elsewhere classified: Secondary | ICD-10-CM | POA: Insufficient documentation

## 2020-08-28 DIAGNOSIS — M25562 Pain in left knee: Secondary | ICD-10-CM | POA: Insufficient documentation

## 2020-08-28 DIAGNOSIS — M6281 Muscle weakness (generalized): Secondary | ICD-10-CM | POA: Insufficient documentation

## 2020-08-28 DIAGNOSIS — M25661 Stiffness of right knee, not elsewhere classified: Secondary | ICD-10-CM | POA: Insufficient documentation

## 2020-08-28 DIAGNOSIS — G8929 Other chronic pain: Secondary | ICD-10-CM | POA: Diagnosis not present

## 2020-08-28 DIAGNOSIS — M25561 Pain in right knee: Secondary | ICD-10-CM | POA: Insufficient documentation

## 2020-08-28 DIAGNOSIS — R2689 Other abnormalities of gait and mobility: Secondary | ICD-10-CM | POA: Diagnosis not present
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
## 2020-08-28 NOTE — Patient Instructions (Signed)
  [id number]

## 2020-09-10 ENCOUNTER — Ambulatory Visit
Admission: EM | Admit: 2020-09-10 | Discharge: 2020-09-10 | Disposition: A | Discharge status: Home / Self Care | Payer: Medicare HMO

## 2020-09-10 ENCOUNTER — Other Ambulatory Visit: Payer: Self-pay

## 2020-09-13 ENCOUNTER — Ambulatory Visit
Admission: EM | Admit: 2020-09-13 | Discharge: 2020-09-13 | Disposition: A | Discharge status: Home / Self Care | Payer: Medicare HMO | Attending: Family Medicine | Admitting: Family Medicine

## 2020-09-13 ENCOUNTER — Encounter: Payer: Self-pay | Admitting: Emergency Medicine

## 2020-09-13 ENCOUNTER — Other Ambulatory Visit: Payer: Self-pay

## 2020-09-13 DIAGNOSIS — H60391 Other infective otitis externa, right ear: Secondary | ICD-10-CM

## 2020-09-13 MED ORDER — CEPHALEXIN 500 MG PO CAPS: 500.0000 mg | ORAL_CAPSULE | Freq: Two times a day (BID) | ORAL | 0 refills | Status: DC

## 2020-09-13 NOTE — ED Triage Notes (Signed)
Right ear pain x 4 days. C/o diarrhea also, states it may have been something he ate, denies abdominal pain, nausea, vomiting. Right external ear canal swollen and tender to examination.

## 2020-09-13 NOTE — ED Provider Notes (Signed)
[facility]    CSN: [id number] Arrival date & time: 09/13/20  1235      History   Chief Complaint Chief Complaint  Patient presents with   Otalgia    HPI Jordan Hines is a 70 y.o. male.   Patient presenting today with 3-day history of right tragus pain, swelling, redness, warmth.  Denies any known injury to the area, recent swimming, fevers, rashes, decreased hearing, drainage from the ear.  Has had this issue before in the past and required antibiotics for it.  Has been trying over-the-counter pain relievers with minimal relief.   Past Medical History:  Diagnosis Date   Anxiety    Arthritis    Cancer ([place]) 2011   Prostate cancer  sx in 2011   Depression    Deviated septum    Hypertension    Nasal turbinate hypertrophy     Patient Active Problem List   Diagnosis Date Noted   Acute gouty arthritis 02/26/2018   Non-allergic rhinitis 01/06/2018    Past Surgical History:  Procedure Laterality Date   COLONOSCOPY  2000   Dr.Orr "normal" per pt-no report    NASAL SEPTOPLASTY W/ TURBINOPLASTY Bilateral 10/19/2018   Procedure: NASAL SEPTOPLASTY WITH  BILATERAL TURBINATE REDUCTION;  Surgeon: [name], MD;  Location: [place];  Service: ENT;  Laterality: Bilateral;   PROSTATE SURGERY         Home Medications    Prior to Admission medications   Medication Sig Start Date End Date Taking? Authorizing Provider  cephALEXin (KEFLEX) 500 MG capsule Take 1 capsule (500 mg total) by mouth 2 (two) times daily. 09/13/20  Yes Volney American, PA-C  allopurinol (ZYLOPRIM) 100 MG tablet Take 1 tablet (100 mg total) by mouth daily. 06/13/20   [name], MD  Bioflavonoid Products (BIOFLEX PO) Take by mouth.    [provider]  fluticasone Asencion Islam) 50 MCG/ACT nasal spray  03/11/19   [provider]  ipratropium (ATROVENT) 0.06 % nasal spray  11/30/19   [provider]  lisinopril (ZESTRIL) 10 MG tablet Take 1 tablet  (10 mg total) by mouth daily. 06/13/20   [name], MD  metoprolol succinate (TOPROL-XL) 25 MG 24 hr tablet Take 1 tablet (25 mg total) by mouth daily. 06/13/20   [name], MD  Multiple Vitamin (MULTIVITAMIN WITH MINERALS) TABS tablet Take 1 tablet by mouth daily.    [provider]  sertraline (ZOLOFT) 50 MG tablet TAKE 1 TABLET EVERY DAY 06/13/20   [name], MD  sildenafil (VIAGRA) 100 MG tablet Take 100 mg by mouth daily. 04/19/18   [provider]    Family History Family History  Problem Relation Age of Onset   Heart disease Mother    Asthma Mother    Cancer Father    Diabetes Father    Colon polyps Brother    Allergic rhinitis Neg Hx    Eczema Neg Hx    Urticaria Neg Hx    Colon cancer Neg Hx    Esophageal cancer Neg Hx    Rectal cancer Neg Hx    Stomach cancer Neg Hx     Social History Social History   Tobacco Use   Smoking status: Every Day    Types: Cigars   Smokeless tobacco: Never   Tobacco comments:    about 7 small cigars daily  Vaping Use   Vaping Use: Never used  Substance Use Topics   Alcohol use:  Yes    Alcohol/week: 3.0 standard drinks    Types: 2 Shots of liquor, 1 Cans of beer per week   Drug use: No     Allergies   Patient has no known allergies.   Review of Systems Review of Systems Per HPI  Physical Exam Triage Vital Signs ED Triage Vitals [09/13/20 1320]  Enc Vitals Group     BP 139/88     Pulse Rate 82     Resp 16     Temp 97.8 F (36.6 C)     Temp Source Oral     SpO2 95 %     Weight      Height      Head Circumference      Peak Flow      Pain Score 3     Pain Loc      Pain Edu?      Excl. in [place]?    No data found.  Updated Vital Signs BP 139/88 (BP Location: Right Arm)   Pulse 82   Temp 97.8 F (36.6 C) (Oral)   Resp 16   SpO2 95%   Visual Acuity Right Eye Distance:   Left Eye Distance:   Bilateral Distance:    Right Eye Near:   Left Eye Near:    Bilateral Near:      Physical Exam Vitals and nursing note reviewed.  Constitutional:      Appearance: Normal appearance.  HENT:     Head: Atraumatic.     Right Ear: Tympanic membrane normal.     Left Ear: Tympanic membrane and external ear normal.     Ears:     Comments: Right tragus erythematous, warm, edematous, tender to palpation.  Remainder of canal fairly benign appearing and TM benign and intact    Nose: Nose normal.     Mouth/Throat:     Mouth: Mucous membranes are moist.  Eyes:     Extraocular Movements: Extraocular movements intact.     Conjunctiva/sclera: Conjunctivae normal.  Cardiovascular:     Rate and Rhythm: Normal rate and regular rhythm.  Pulmonary:     Effort: Pulmonary effort is normal.     Breath sounds: Normal breath sounds.  Musculoskeletal:        General: Normal range of motion.     Cervical back: Normal range of motion and neck supple.  Lymphadenopathy:     Cervical: No cervical adenopathy.  Skin:    General: Skin is warm and dry.  Neurological:     General: No focal deficit present.     Mental Status: He is oriented to person, place, and time.  Psychiatric:        Mood and Affect: Mood normal.        Thought Content: Thought content normal.        Judgment: Judgment normal.     UC Treatments / Results  Labs (all labs ordered are listed, but only abnormal results are displayed) Labs Reviewed - No data to display  EKG   Radiology No results found.  Procedures Procedures (including critical care time)  Medications Ordered in UC Medications - No data to display  Initial Impression / Assessment and Plan / UC Course  I have reviewed the triage vital signs and the nursing notes.  Pertinent labs & imaging results that were available during my care of the patient were reviewed by me and considered in my medical decision making (see chart for details).  Glass infection to right tragus, will treat with Keflex, warm compresses, over-the-counter pain  relievers as needed.  Follow-up if worsening or not resolving.  Final Clinical Impressions(s) / UC Diagnoses   Final diagnoses:  Other infective acute otitis externa of right ear   Discharge Instructions   None    ED Prescriptions     Medication Sig Dispense Auth. Provider   cephALEXin (KEFLEX) 500 MG capsule Take 1 capsule (500 mg total) by mouth 2 (two) times daily. 14 capsule Volney American, [place]      PDMP not reviewed this encounter.   Volney American, [place] 09/13/20 1421

## 2020-09-17 ENCOUNTER — Ambulatory Visit: Payer: Medicare HMO | Admitting: Family Medicine

## 2020-09-19 ENCOUNTER — Ambulatory Visit: Payer: Medicare HMO | Admitting: Family Medicine

## 2020-10-01 ENCOUNTER — Other Ambulatory Visit: Payer: Self-pay

## 2020-10-01 ENCOUNTER — Ambulatory Visit (INDEPENDENT_AMBULATORY_CARE_PROVIDER_SITE_OTHER): Payer: Medicare HMO | Admitting: Family Medicine

## 2020-10-01 ENCOUNTER — Encounter: Payer: Self-pay | Admitting: Family Medicine

## 2020-10-01 VITALS — BP 130/74 | HR 98 | Temp 97.3°F | Ht 72.0 in | Wt 261.6 lb

## 2020-10-01 DIAGNOSIS — N3001 Acute cystitis with hematuria: Secondary | ICD-10-CM | POA: Diagnosis not present

## 2020-10-01 DIAGNOSIS — H6091 Unspecified otitis externa, right ear: Secondary | ICD-10-CM | POA: Diagnosis not present

## 2020-10-01 LAB — POCT URINALYSIS DIPSTICK
Bilirubin, UA: NEGATIVE
Blood, UA: 200
Glucose, UA: NEGATIVE
Ketones, UA: NEGATIVE
Nitrite, UA: POSITIVE
Protein, UA: POSITIVE — AB
Spec Grav, UA: 1.025 (ref 1.010–1.025)
Urobilinogen, UA: 1 E.U./dL
pH, UA: 6 (ref 5.0–8.0)

## 2020-10-01 MED ORDER — SULFAMETHOXAZOLE-TRIMETHOPRIM 800-160 MG PO TABS: 1.0000 | ORAL_TABLET | Freq: Two times a day (BID) | ORAL | 0 refills | Status: DC

## 2020-10-01 MED ORDER — NEOMYCIN-POLYMYXIN-HC 3.5-10000-1 OT SOLN: 3.0000 [drp] | Freq: Four times a day (QID) | OTIC | 0 refills | Status: DC

## 2020-10-01 NOTE — Progress Notes (Signed)
[facility] [address] [place] [address] Dept: [phone] Dept Fax: [phone]  Office Visit  Subjective:    Patient ID: Jordan Hines, male    DOB: 07/13/1950, 70 y.o..   MRN: [id number]  Chief Complaint  Patient presents with   Follow-up    Pt c/o UTI with urine frequency, pain and trace of blood x 6 days. Pt also c/o dull pain in right ear, discharge when pressure is applied to right ear lobe  x3 weeks.     History of Present Illness:  Patient is in today complaining of a 5-6 day history of burning with urination. He notes he started taking Azo over the weekend, but stopped thi after 2 days. Last night he was back to having dysuria and got up 7-8 times overnight to urinate. He denies any penile rash. He is uncircumcised, but having no difficulty retracting his foreskin and noted no discharge.  Also, Mr. Hitchner was seen in UC 2 weeks ago with right ear pain. He was treated with a course of Keflex for an ear infection. He notes he still has pain with tragal manipulation. He also has had a sense of some drainage from the ear.  Past Medical History: Patient Active Problem List   Diagnosis Date Noted   Acute gouty arthritis 02/26/2018   Non-allergic rhinitis 01/06/2018   Past Surgical History:  Procedure Laterality Date   COLONOSCOPY  2000   Dr.Orr "normal" per pt-no report    NASAL SEPTOPLASTY W/ TURBINOPLASTY Bilateral 10/19/2018   Procedure: NASAL SEPTOPLASTY WITH  BILATERAL TURBINATE REDUCTION;  Surgeon: [name], MD;  Location: [place];  Service: ENT;  Laterality: Bilateral;   PROSTATE SURGERY     Family History  Problem Relation Age of Onset   Heart disease Mother    Asthma Mother    Cancer Father    Diabetes Father    Colon polyps Brother    Allergic rhinitis Neg Hx    Eczema Neg Hx    Urticaria Neg Hx    Colon cancer Neg Hx    Esophageal cancer Neg Hx    Rectal cancer Neg Hx     Stomach cancer Neg Hx    Outpatient Medications Prior to Visit  Medication Sig Dispense Refill   allopurinol (ZYLOPRIM) 100 MG tablet Take 1 tablet (100 mg total) by mouth daily. 90 tablet 2   Bioflavonoid Products (BIOFLEX PO) Take by mouth.     fluticasone (FLONASE) 50 MCG/ACT nasal spray      ipratropium (ATROVENT) 0.06 % nasal spray      lisinopril (ZESTRIL) 10 MG tablet Take 1 tablet (10 mg total) by mouth daily. 90 tablet 1   metoprolol succinate (TOPROL-XL) 25 MG 24 hr tablet Take 1 tablet (25 mg total) by mouth daily. 90 tablet 1   Multiple Vitamin (MULTIVITAMIN WITH MINERALS) TABS tablet Take 1 tablet by mouth daily.     sertraline (ZOLOFT) 50 MG tablet TAKE 1 TABLET EVERY DAY 90 tablet 2   sildenafil (VIAGRA) 100 MG tablet Take 100 mg by mouth daily.     cephALEXin (KEFLEX) 500 MG capsule Take 1 capsule (500 mg total) by mouth 2 (two) times daily. 14 capsule 0   No facility-administered medications prior to visit.   No Known Allergies    Objective:   Today's Vitals   10/01/20 1401  BP: 130/74  Pulse: 98  Temp: (!) 97.3 F (36.3 C)  TempSrc: Temporal  SpO2: 96%  Weight: 261 lb 9.6 oz (118.7 kg)  Height: 6' (1.829 m)   Body mass index is 35.48 kg/m.   General: Well developed, well nourished. No acute distress. HEENT: Normocephalic, non-traumatic. There is mild scaliness around the right external auditory meatus. There is midl   pain with tragal manipulation. There is mild swelling of the EAC with scant yellowish-white discharge on the floor of the    canal. The TM appears intact. The left EAC and TM are normal.  Psych: Alert and oriented. Normal mood and affect.  Health Maintenance Due  Topic Date Due   Zoster Vaccines- Shingrix (1 of 2) Never done   COVID-19 Vaccine (3 - Booster for Pfizer series) 09/05/2019   INFLUENZA VACCINE  09/10/2020   Lab Results Urine dipstick shows negative for glucose, ketones, positive for nitrites, leukocytes, red blood cells,  protein, urobilinogen.  Assessment & Plan:   1. Acute cystitis with hematuria The dipstick is consistent with an acute UTI. I will send the urine for culture. I will empirically treat him with Septra. Mr. Hille can resume the Azo for another 2 days. He should push fluids. He will follow-up with Dr. Carlota Raspberry if not improving.  - POCT Urinalysis Dipstick - sulfamethoxazole-trimethoprim (BACTRIM DS) 800-160 MG tablet; Take 1 tablet by mouth 2 (two) times daily.  Dispense: 14 tablet; Refill: 0 - Urine Culture  2. Otitis externa of right ear, unspecified chronicity, unspecified type The exam is consistent with an external otitis. I suspect he may have underlying seborrhea that predisposed to this. I will place him on cortisporin drops for 7 days.  - neomycin-polymyxin-hydrocortisone (CORTISPORIN) OTIC solution; Place 3 drops into the right ear 4 (four) times daily.  Dispense: 10 mL; Refill: 0  Haydee Salter, MD

## 2020-10-03 LAB — URINE CULTURE
MICRO NUMBER:: 12273308
SPECIMEN QUALITY:: ADEQUATE

## 2020-12-19 ENCOUNTER — Ambulatory Visit (INDEPENDENT_AMBULATORY_CARE_PROVIDER_SITE_OTHER): Payer: Medicare HMO | Admitting: Family Medicine

## 2020-12-19 ENCOUNTER — Encounter: Payer: Self-pay | Admitting: Family Medicine

## 2020-12-19 ENCOUNTER — Other Ambulatory Visit: Payer: Self-pay

## 2020-12-19 VITALS — BP 120/82 | HR 83 | Temp 97.4°F | Ht 72.0 in | Wt 267.6 lb

## 2020-12-19 DIAGNOSIS — J310 Chronic rhinitis: Secondary | ICD-10-CM

## 2020-12-19 DIAGNOSIS — Z23 Encounter for immunization: Secondary | ICD-10-CM | POA: Diagnosis not present

## 2020-12-19 DIAGNOSIS — E669 Obesity, unspecified: Secondary | ICD-10-CM | POA: Insufficient documentation

## 2020-12-19 DIAGNOSIS — R5382 Chronic fatigue, unspecified: Secondary | ICD-10-CM | POA: Diagnosis not present

## 2020-12-19 DIAGNOSIS — I10 Essential (primary) hypertension: Secondary | ICD-10-CM

## 2020-12-19 DIAGNOSIS — E6609 Other obesity due to excess calories: Secondary | ICD-10-CM | POA: Insufficient documentation

## 2020-12-19 DIAGNOSIS — Z6836 Body mass index (BMI) 36.0-36.9, adult: Secondary | ICD-10-CM | POA: Insufficient documentation

## 2020-12-19 MED ORDER — IPRATROPIUM BROMIDE 0.06 % NA SOLN: 1.0000 | Freq: Three times a day (TID) | NASAL | 3 refills | Status: DC

## 2020-12-19 MED ORDER — AZELASTINE HCL 0.1 % NA SOLN: 1.0000 | Freq: Two times a day (BID) | NASAL | 12 refills | Status: DC

## 2020-12-19 NOTE — Progress Notes (Signed)
[facility] [address] [place] [address] Dept: [phone] Dept Fax: [phone]  Office Visit  Subjective:    Patient ID: Jordan Hines, male    DOB: [birth year], 70 y.o..   MRN: [id number]  Chief Complaint  Patient presents with   Acute Visit    C/o having sinus issues & fatigue x 1 year gotten 6 months.  He has been using a nasal rinse and fluticasone with no relief.    Wants flu shot today.     History of Present Illness:  Patient is in today for evaluation of chronic rhinitis. He states that he has had issues with his sinuses for some years. He notes he was referred to an allergist, who did not find any allergic issue. He was then referred to an ENT, who apparently found a nasal polyp, which was removed. Despite these prior interventions, Jordan Hines notes ongoing issues with daily rhinorrhea. He had been prescribed Flonase and Atrovent nasal sprays, but notes he is primarily using only the Flonase.  Additionally, Jordan Hines complains of fatigue. He notes this has been going on for the past 8 months. He finds that he does not have the stamina he would like when he goes out to do activities, such as raking leaves. He admits to mild orthopnea, but denies any PND. He admits to mild swelling of his lower legs. He is currently treated with metoprolol and lisinopril, apparently for hypertension.  Past Medical History: Patient Active Problem List   Diagnosis Date Noted   Essential hypertension 12/19/2020   BMI 36.0-36.9,adult 12/19/2020   Acute gouty arthritis 02/26/2018   Non-allergic rhinitis 01/06/2018   Past Surgical History:  Procedure Laterality Date   COLONOSCOPY  2000   Dr.Orr "normal" per pt-no report    NASAL SEPTOPLASTY W/ TURBINOPLASTY Bilateral 10/19/2018   Procedure: NASAL SEPTOPLASTY WITH  BILATERAL TURBINATE REDUCTION;  Surgeon: [name], MD;  Location: [facility];  Service: ENT;   Laterality: Bilateral;   PROSTATE SURGERY     Family History  Problem Relation Age of Onset   Heart disease Mother    Asthma Mother    Cancer Father    Diabetes Father    Colon polyps Brother    Allergic rhinitis Neg Hx    Eczema Neg Hx    Urticaria Neg Hx    Colon cancer Neg Hx    Esophageal cancer Neg Hx    Rectal cancer Neg Hx    Stomach cancer Neg Hx    Outpatient Medications Prior to Visit  Medication Sig Dispense Refill   allopurinol (ZYLOPRIM) 100 MG tablet Take 1 tablet (100 mg total) by mouth daily. 90 tablet 2   fluticasone (FLONASE) 50 MCG/ACT nasal spray      lisinopril (ZESTRIL) 10 MG tablet Take 1 tablet (10 mg total) by mouth daily. 90 tablet 1   metoprolol succinate (TOPROL-XL) 25 MG 24 hr tablet Take 1 tablet (25 mg total) by mouth daily. 90 tablet 1   Multiple Vitamin (MULTIVITAMIN WITH MINERALS) TABS tablet Take 1 tablet by mouth daily.     sertraline (ZOLOFT) 50 MG tablet TAKE 1 TABLET EVERY DAY 90 tablet 2   sildenafil (VIAGRA) 100 MG tablet Take 100 mg by mouth daily.     ipratropium (ATROVENT) 0.06 % nasal spray      Bioflavonoid Products (BIOFLEX PO) Take by mouth. (Patient not taking: Reported on 12/19/2020)     neomycin-polymyxin-hydrocortisone (CORTISPORIN) OTIC solution Place  3 drops into the right ear 4 (four) times daily. 10 mL 0   sulfamethoxazole-trimethoprim (BACTRIM DS) 800-160 MG tablet Take 1 tablet by mouth 2 (two) times daily. 14 tablet 0   No facility-administered medications prior to visit.   No Known Allergies    Objective:   Today's Vitals   12/19/20 1008  BP: 120/82  Pulse: 83  Temp: (!) 97.4 F (36.3 C)  TempSrc: Temporal  SpO2: 96%  Weight: 267 lb 9.6 oz (121.4 kg)  Height: 6' (1.829 m)   Body mass index is 36.29 kg/m.   General: Well developed, well nourished. No acute distress. HEENT: Normocephalic, non-traumatic. PERRL, EOMI. Conjunctiva clear. External ears normal. EAC   and TMs normal bilaterally. Nose clear  without congestion, but moderate clear rhinorrhea. Mucous   membranes moist. Oropharynx clear. Good dentition. Neck: Supple. No lymphadenopathy. No thyromegaly. Lungs: Clear to auscultation bilaterally. No wheezing, rales or rhonchi. CV: RRR without murmurs or rubs. Pulses 2+ bilaterally. Extremities: Trace bilateral lower leg edema. Psych: Alert and oriented. Normal mood and affect.  Health Maintenance Due  Topic Date Due   Zoster Vaccines- Shingrix (1 of 2) Never done   COVID-19 Vaccine (3 - Booster for Pfizer series) 06/03/2019     Assessment & Plan:   1. Chronic fatigue In reviewing the medical record, I see Jordan Hines was evaluated by Dr. Miachel Roux 2 years ago for complaints of fatigue. It appears he has had this issue for longer than he remembered. He did have an assessment, including an EKG. Dr. Irish Lack felt his "fatigue" likely represented deconditioning. However, the complaint of mild orthopnea and the finding of trace edema does raise concern for possible CHF as an underlying issue. I will check labs ot look for other potential underlying causes. Based on these findings, I would consider a referral back to cardiology for reassessment and possibly an echocardiogram.  - B Nat Peptide; Future - CBC; Future - Comprehensive metabolic panel; Future - Lipid panel; Future - TSH; Future - Vitamin B12; Future  2. Non-allergic rhinitis I reviewed prior consult notes from Dr. Maudie Mercury (Allergy) and Dr. Benjamine Mola (ENT). His issue appears to be non-allergic rhinitis. I will have him resume use of the Atrovent nasal spray, but also add azelastine spray, to see if this better controls his rhinorrhea.  - ipratropium (ATROVENT) 0.06 % nasal spray; Place 1 spray into both nostrils 3 (three) times daily.  Dispense: 15 mL; Refill: 3 - azelastine (ASTELIN) 0.1 % nasal spray; Place 1 spray into both nostrils 2 (two) times daily. Use in each nostril as directed  Dispense: 30 mL; Refill: 12  3. Essential  hypertension Blood pressure is at goal. Continue lisinopril and metoprolol.  4. Need for influenza vaccination  - Flu Vaccine QUAD High Dose(Fluad)  Haydee Salter, MD

## 2020-12-20 ENCOUNTER — Encounter: Payer: Self-pay | Admitting: Nurse Practitioner

## 2020-12-20 ENCOUNTER — Ambulatory Visit (INDEPENDENT_AMBULATORY_CARE_PROVIDER_SITE_OTHER): Payer: Medicare HMO | Admitting: Nurse Practitioner

## 2020-12-20 ENCOUNTER — Other Ambulatory Visit: Payer: Medicare HMO

## 2020-12-20 VITALS — BP 136/90 | HR 83 | Temp 97.8°F | Resp 16 | Ht 73.0 in | Wt 271.4 lb

## 2020-12-20 DIAGNOSIS — N3001 Acute cystitis with hematuria: Secondary | ICD-10-CM | POA: Diagnosis not present

## 2020-12-20 DIAGNOSIS — R5382 Chronic fatigue, unspecified: Secondary | ICD-10-CM

## 2020-12-20 DIAGNOSIS — R300 Dysuria: Secondary | ICD-10-CM | POA: Diagnosis not present

## 2020-12-20 LAB — POCT URINALYSIS DIP (CLINITEK)
Bilirubin, UA: NEGATIVE
Glucose, UA: NEGATIVE mg/dL
Ketones, POC UA: NEGATIVE mg/dL
Nitrite, UA: NEGATIVE
POC PROTEIN,UA: NEGATIVE
Spec Grav, UA: 1.01 (ref 1.010–1.025)
Urobilinogen, UA: 0.2 E.U./dL
pH, UA: 6 (ref 5.0–8.0)

## 2020-12-20 LAB — URINALYSIS, MICROSCOPIC ONLY

## 2020-12-20 MED ORDER — SULFAMETHOXAZOLE-TRIMETHOPRIM 800-160 MG PO TABS: 1.0000 | ORAL_TABLET | Freq: Two times a day (BID) | ORAL | 0 refills | Status: AC

## 2020-12-20 NOTE — Assessment & Plan Note (Signed)
UA in office indicative of a urinary tract infection with microscopic hematuria we will send off for urine culture and urine microscopy since patient is a smoker to make sure.  If it has blood second off once infection is treated we will need to make sure it does resolve if present.

## 2020-12-20 NOTE — Patient Instructions (Signed)
Nice to see you today Sent antibiotics over to your pharmacy If you do not start improving please follow up with Dr. Gena Fray or your urologist.  I am sending your urine off for culture and will be in touch if anything needs to change

## 2020-12-20 NOTE — Assessment & Plan Note (Signed)
History of the same and has responded to Bactrim in the past.  Last culture grew out bacteria susceptible to Bactrim.  We will start patient on Bactrim twice daily for 7 days.  Continue to monitor pending urine microscopy and culture.

## 2020-12-20 NOTE — Progress Notes (Signed)
Acute Office Visit  Subjective:    Patient ID: Jordan Hines, male    DOB: Jun 25, 1950, 70 y.o.   MRN: [id number]  Chief Complaint  Patient presents with   Urine odor    Some burning with urination and urgency. No blood in the urine. Sx started about 2 weeks ago.    HPI Patient is in today for Urine Odor  Symptoms started approx 2 weeks ago. Waxed  and wann. States that he noticed the odor one day this week (Monday).  Has not tried OTC regimens yet.  History of UTI and Prostatitis.  Hx of Prostatectomy related to prostate cancer  Past Medical History:  Diagnosis Date   Anxiety    Arthritis    Cancer ([facility]) 2011   Prostate cancer  sx in 2011   Depression    Deviated septum    Hypertension    Nasal turbinate hypertrophy     Past Surgical History:  Procedure Laterality Date   COLONOSCOPY  2000   Dr.Orr "normal" per pt-no report    NASAL SEPTOPLASTY W/ TURBINOPLASTY Bilateral 10/19/2018   Procedure: NASAL SEPTOPLASTY WITH  BILATERAL TURBINATE REDUCTION;  Surgeon: [name], MD;  Location: [facility];  Service: ENT;  Laterality: Bilateral;   PROSTATE SURGERY      Family History  Problem Relation Age of Onset   Heart disease Mother    Asthma Mother    Cancer Father    Diabetes Father    Colon polyps Brother    Allergic rhinitis Neg Hx    Eczema Neg Hx    Urticaria Neg Hx    Colon cancer Neg Hx    Esophageal cancer Neg Hx    Rectal cancer Neg Hx    Stomach cancer Neg Hx     Social History   Socioeconomic History   Marital status: Divorced    Spouse name: Not on file   Number of children: 1   Years of education: Not on file   Highest education level: Some college, no degree  Occupational History   Not on file  Tobacco Use   Smoking status: Every Day    Types: Cigars   Smokeless tobacco: Never   Tobacco comments:    about 7 small cigars daily  Vaping Use   Vaping Use: Never used  Substance and Sexual Activity   Alcohol use: Yes     Alcohol/week: 3.0 standard drinks    Types: 2 Shots of liquor, 1 Cans of beer per week   Drug use: No   Sexual activity: Yes  Other Topics Concern   Not on file  Social History Narrative   Not on file   Social Determinants of Health   Financial Resource Strain: Not on file  Food Insecurity: Not on file  Transportation Needs: Not on file  Physical Activity: Not on file  Stress: Not on file  Social Connections: Not on file  Intimate Partner Violence: Not on file    Outpatient Medications Prior to Visit  Medication Sig Dispense Refill   allopurinol (ZYLOPRIM) 100 MG tablet Take 1 tablet (100 mg total) by mouth daily. 90 tablet 2   azelastine (ASTELIN) 0.1 % nasal spray Place 1 spray into both nostrils 2 (two) times daily. Use in each nostril as directed 30 mL 12   cyanocobalamin 1000 MCG tablet Take 1,000 mcg by mouth daily.     fluticasone (FLONASE) 50 MCG/ACT nasal spray      ipratropium (ATROVENT) 0.06 %  nasal spray Place 1 spray into both nostrils 3 (three) times daily. 15 mL 3   lisinopril (ZESTRIL) 10 MG tablet Take 1 tablet (10 mg total) by mouth daily. 90 tablet 1   metoprolol succinate (TOPROL-XL) 25 MG 24 hr tablet Take 1 tablet (25 mg total) by mouth daily. 90 tablet 1   Multiple Vitamin (MULTIVITAMIN WITH MINERALS) TABS tablet Take 1 tablet by mouth daily.     sertraline (ZOLOFT) 50 MG tablet TAKE 1 TABLET EVERY DAY 90 tablet 2   sildenafil (VIAGRA) 100 MG tablet Take 100 mg by mouth daily.     No facility-administered medications prior to visit.    No Known Allergies  Review of Systems  Constitutional:  Positive for chills. Negative for fever.  Gastrointestinal:  Negative for abdominal pain, diarrhea, nausea and vomiting.  Genitourinary:  Positive for dysuria and frequency. Negative for hematuria.       Nocturia at baseline states he does not feel that it has increased  Musculoskeletal:  Negative for back pain.      Objective:    Physical Exam Vitals and  nursing note reviewed.  Constitutional:      Appearance: Normal appearance.  Cardiovascular:     Rate and Rhythm: Normal rate and regular rhythm.  Pulmonary:     Effort: Pulmonary effort is normal.     Breath sounds: Normal breath sounds.  Abdominal:     General: Bowel sounds are normal. There is no distension.     Palpations: There is no mass.     Tenderness: There is no abdominal tenderness. There is no right CVA tenderness or left CVA tenderness.  Neurological:     Mental Status: He is alert.  Psychiatric:        Mood and Affect: Mood normal.        Behavior: Behavior normal.        Thought Content: Thought content normal.        Judgment: Judgment normal.    BP 136/90   Pulse 83   Temp 97.8 F (36.6 C)   Resp 16   Ht 6\' 1"  (1.854 m)   Wt 271 lb 6 oz (123.1 kg)   SpO2 99%   BMI 35.80 kg/m  Wt Readings from Last 3 Encounters:  12/20/20 271 lb 6 oz (123.1 kg)  12/19/20 267 lb 9.6 oz (121.4 kg)  10/01/20 261 lb 9.6 oz (118.7 kg)    Health Maintenance Due  Topic Date Due   Zoster Vaccines- Shingrix (1 of 2) Never done   COVID-19 Vaccine (3 - Booster for Pfizer series) 06/03/2019    There are no preventive care reminders to display for this patient.   Lab Results  Component Value Date   TSH 1.730 04/04/2019   Lab Results  Component Value Date   WBC 9.4 05/27/2019   HGB 14.0 05/27/2019   HCT 43.5 (A) 05/27/2019   MCV 91.0 05/27/2019   PLT 243 04/04/2019   Lab Results  Component Value Date   NA 139 06/13/2020   K 4.5 06/13/2020   CO2 28 06/13/2020   GLUCOSE 72 06/13/2020   BUN 17 06/13/2020   CREATININE 1.21 (H) 06/13/2020   BILITOT 0.6 06/13/2020   ALKPHOS 105 12/09/2019   AST 22 06/13/2020   ALT 19 06/13/2020   PROT 6.8 06/13/2020   ALBUMIN 4.0 12/09/2019   CALCIUM 9.4 06/13/2020   Lab Results  Component Value Date   CHOL 186 06/13/2020   Lab Results  Component  Value Date   HDL 66.70 06/13/2020   Lab Results  Component Value Date    LDLCALC 87 06/13/2020   Lab Results  Component Value Date   TRIG 162.0 (H) 06/13/2020   Lab Results  Component Value Date   CHOLHDL 3 06/13/2020   Lab Results  Component Value Date   HGBA1C 6.0 06/13/2020       Assessment & Plan:   Problem List Items Addressed This Visit       Genitourinary   Acute cystitis with hematuria    History of the same and has responded to Bactrim in the past.  Last culture grew out bacteria susceptible to Bactrim.  We will start patient on Bactrim twice daily for 7 days.  Continue to monitor pending urine microscopy and culture.      Relevant Medications   sulfamethoxazole-trimethoprim (BACTRIM DS) 800-160 MG tablet   Other Relevant Orders   Urinalysis, microscopic only (Completed)   Urine Culture     Other   Burning with urination - Primary    UA in office indicative of a urinary tract infection with microscopic hematuria we will send off for urine culture and urine microscopy since patient is a smoker to make sure.  If it has blood second off once infection is treated we will need to make sure it does resolve if present.      Relevant Orders   POCT URINALYSIS DIP (CLINITEK) (Completed)   Urinalysis, microscopic only (Completed)   Urine Culture   Other Visit Diagnoses     Chronic fatigue            No orders of the defined types were placed in this encounter.  This visit occurred during the SARS-CoV-2 public health emergency.  Safety protocols were in place, including screening questions prior to the visit, additional usage of staff PPE, and extensive cleaning of exam room while observing appropriate contact time as indicated for disinfecting solutions.   Romilda Garret, NP

## 2020-12-21 LAB — LIPID PANEL
Cholesterol: 185 mg/dL (ref 0–200)
HDL: 77.3 mg/dL (ref >39.00)
LDL Cholesterol: 71 mg/dL (ref 0–99)
NonHDL: 107.3
Total CHOL/HDL Ratio: 2
Triglycerides: 180 mg/dL — ABNORMAL HIGH (ref 0.0–149.0)
VLDL: 36 mg/dL (ref 0.0–40.0)

## 2020-12-21 LAB — COMPREHENSIVE METABOLIC PANEL
ALT: 19 U/L (ref 0–53)
AST: 21 U/L (ref 0–37)
Albumin: 4.2 g/dL (ref 3.5–5.2)
Alkaline Phosphatase: 82 U/L (ref 39–117)
BUN: 18 mg/dL (ref 6–23)
CO2: 28 mEq/L (ref 19–32)
Calcium: 9 mg/dL (ref 8.4–10.5)
Chloride: 100 mEq/L (ref 96–112)
Creatinine, Ser: 0.99 mg/dL (ref 0.40–1.50)
GFR: 76.4 mL/min (ref >60.00)
Glucose, Bld: 99 mg/dL (ref 70–99)
Potassium: 4.3 mEq/L (ref 3.5–5.1)
Sodium: 135 mEq/L (ref 135–145)
Total Bilirubin: 0.6 mg/dL (ref 0.2–1.2)
Total Protein: 7 g/dL (ref 6.0–8.3)

## 2020-12-21 LAB — VITAMIN B12: Vitamin B-12: 760 pg/mL (ref 211–911)

## 2020-12-21 LAB — CBC
HCT: 41.3 % (ref 39.0–52.0)
Hemoglobin: 13.7 g/dL (ref 13.0–17.0)
MCHC: 33 g/dL (ref 30.0–36.0)
MCV: 91.1 fl (ref 78.0–100.0)
Platelets: 220 10*3/uL (ref 150.0–400.0)
RBC: 4.54 Mil/uL (ref 4.22–5.81)
RDW: 15 % (ref 11.5–15.5)
WBC: 4.8 10*3/uL (ref 4.0–10.5)

## 2020-12-21 LAB — BRAIN NATRIURETIC PEPTIDE: Pro B Natriuretic peptide (BNP): 14 pg/mL (ref 0.0–100.0)

## 2020-12-21 LAB — TSH: TSH: 1.32 u[IU]/mL (ref 0.35–5.50)

## 2020-12-23 LAB — URINE CULTURE
MICRO NUMBER:: 12621003
SPECIMEN QUALITY:: ADEQUATE

## 2021-03-14 ENCOUNTER — Other Ambulatory Visit: Payer: Self-pay

## 2021-03-14 DIAGNOSIS — J310 Chronic rhinitis: Secondary | ICD-10-CM

## 2021-03-16 ENCOUNTER — Other Ambulatory Visit: Payer: Self-pay | Admitting: Family Medicine

## 2021-03-16 DIAGNOSIS — F32A Depression, unspecified: Secondary | ICD-10-CM

## 2021-03-16 DIAGNOSIS — I10 Essential (primary) hypertension: Secondary | ICD-10-CM

## 2021-03-16 DIAGNOSIS — M109 Gout, unspecified: Secondary | ICD-10-CM

## 2021-03-22 ENCOUNTER — Ambulatory Visit (INDEPENDENT_AMBULATORY_CARE_PROVIDER_SITE_OTHER): Payer: Medicare HMO | Admitting: Family Medicine

## 2021-03-22 ENCOUNTER — Encounter: Payer: Self-pay | Admitting: Family Medicine

## 2021-03-22 ENCOUNTER — Other Ambulatory Visit: Payer: Self-pay

## 2021-03-22 VITALS — BP 124/80 | HR 80 | Temp 97.6°F | Ht 73.0 in | Wt 277.6 lb

## 2021-03-22 DIAGNOSIS — F325 Major depressive disorder, single episode, in full remission: Secondary | ICD-10-CM | POA: Diagnosis not present

## 2021-03-22 DIAGNOSIS — R0683 Snoring: Secondary | ICD-10-CM

## 2021-03-22 DIAGNOSIS — M109 Gout, unspecified: Secondary | ICD-10-CM

## 2021-03-22 DIAGNOSIS — I10 Essential (primary) hypertension: Secondary | ICD-10-CM

## 2021-03-22 DIAGNOSIS — J310 Chronic rhinitis: Secondary | ICD-10-CM | POA: Diagnosis not present

## 2021-03-22 DIAGNOSIS — F17209 Nicotine dependence, unspecified, with unspecified nicotine-induced disorders: Secondary | ICD-10-CM | POA: Insufficient documentation

## 2021-03-22 DIAGNOSIS — R5383 Other fatigue: Secondary | ICD-10-CM | POA: Diagnosis not present

## 2021-03-22 DIAGNOSIS — N529 Male erectile dysfunction, unspecified: Secondary | ICD-10-CM | POA: Insufficient documentation

## 2021-03-22 DIAGNOSIS — R300 Dysuria: Secondary | ICD-10-CM

## 2021-03-22 DIAGNOSIS — Z8546 Personal history of malignant neoplasm of prostate: Secondary | ICD-10-CM | POA: Diagnosis not present

## 2021-03-22 DIAGNOSIS — N3000 Acute cystitis without hematuria: Secondary | ICD-10-CM

## 2021-03-22 NOTE — Progress Notes (Addendum)
[facility] [address] [place] [address] Dept: [phone] Dept Fax: [phone]  New Patient Office Visit  Subjective:    Patient ID: Jordan Hines, male    DOB: [birth year], 71 y.o..   MRN: [id number]  Chief Complaint  Patient presents with   [facility] care.   C/o feeling fatigue.      History of Present Illness:  Patient is in today to establish care. Jordan Hines was born in [place], [place]. He moved to [place] in 1986. He did spend a few years living in [place], working in [place]. He attended [employer], though he did not complete his degree in business administration. 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 2022. He has been divorced for 8 years. He has a soon (75) who lives in [place]. Jordan Hines smokes 2-3 cigarillos daily. He used to smoke much heavier (2-3 ppd of cigarettes), but quit this 15 years ago. He drinks about 4 shots of liquor a day. He uses CBD, primarily related to knee stiffness.  Jordan Hines has a history of prostate cancer. He underwent a prostatectomy in 2011. He has no evidence fo recurrence. However, he does struggle with some chronic or recurrent cystitis. He notes that currently he is having some increased symptoms. He typically gets up 2-3 times a night to urinate, but when infection is present, this increases. He has some ED associated with his surgery and uses sildenafil for this.  Jordan Hines has a history of hypertension. He is managed on lisinopril and metoprolol.  Jordan Hines has a history of non-allergic rhinitis. He has had prior sinus surgery. He is managed on azelastine, fluticasone, and ipratropium spray. He finds that the approaches he has tried have not resolved his issue. He notes that he feels his tobacco  use may be a part of this. He had been referred to Dr. Benjamine Mola, but feels no improvement has happened, so he stopped going.  Jordan Hines has a history of gout. His last attack was ~ 9 mo. ago. He takes allopurinol for this.  Jordan Hines has a history of past depression. He feels this no longer bothers him. He notes he no longer has the stressors that seemed to be stimulating his depression. He wonders about stopping his medication.  Jordan Hines notes that he often feels fatigued, like he has no energy. He admits that since stopping work, he is not engaging in daily physical activity. He spends most of his days watching sports on TV. He does have a history of snoring, though his friend has not noted any apneic spells. He often feels his sleep is non-restorative, but he denies any particular daytime hypersomnolence.  Past Medical History: Patient Active Problem List   Diagnosis Date Noted   Erectile dysfunction 03/22/2021   History of prostate cancer 03/22/2021   Acute cystitis with hematuria 12/20/2020   Burning with urination 12/20/2020   Essential hypertension 12/19/2020   BMI 36.0-36.9,adult 12/19/2020   Acute gouty arthritis 02/26/2018   Non-allergic rhinitis 01/06/2018   Past Surgical History:  Procedure Laterality Date   COLONOSCOPY  2000   Dr.Orr "normal" per pt-no report    INGUINAL HERNIA REPAIR Right    NASAL SEPTOPLASTY W/ TURBINOPLASTY Bilateral 10/19/2018   Procedure: NASAL SEPTOPLASTY WITH  BILATERAL TURBINATE REDUCTION;  Surgeon:  [name], MD;  Location: [facility];  Service: ENT;  Laterality: Bilateral;   PROSTATE SURGERY     Family History  Problem Relation Age of Onset   Kidney disease Mother    Heart disease Mother    Asthma Mother    Cancer Father        Non-Hodgkins lymphoma   Diabetes Father    Cancer Sister        Bladder   Colon polyps Brother    Heart disease Maternal Grandmother    Heart disease Paternal Grandmother    Diabetes Niece     Diabetes Nephew    Allergic rhinitis Neg Hx    Eczema Neg Hx    Urticaria Neg Hx    Colon cancer Neg Hx    Esophageal cancer Neg Hx    Rectal cancer Neg Hx    Stomach cancer Neg Hx    Outpatient Medications Prior to Visit  Medication Sig Dispense Refill   allopurinol (ZYLOPRIM) 100 MG tablet TAKE 1 TABLET EVERY DAY 90 tablet 2   azelastine (ASTELIN) 0.1 % nasal spray Place 1 spray into both nostrils 2 (two) times daily. Use in each nostril as directed 30 mL 12   cyanocobalamin 1000 MCG tablet Take 1,000 mcg by mouth daily.     fluticasone (FLONASE) 50 MCG/ACT nasal spray      ipratropium (ATROVENT) 0.06 % nasal spray Place 1 spray into both nostrils 3 (three) times daily. 15 mL 3   lisinopril (ZESTRIL) 10 MG tablet TAKE 1 TABLET EVERY DAY 90 tablet 1   metoprolol succinate (TOPROL-XL) 25 MG 24 hr tablet TAKE 1 TABLET EVERY DAY 90 tablet 1   Multiple Vitamin (MULTIVITAMIN WITH MINERALS) TABS tablet Take 1 tablet by mouth daily.     sertraline (ZOLOFT) 50 MG tablet TAKE 1 TABLET EVERY DAY 90 tablet 2   sildenafil (VIAGRA) 100 MG tablet Take 100 mg by mouth daily.     No facility-administered medications prior to visit.   No Known Allergies    Objective:   Today's Vitals   03/22/21 0953  BP: 124/80  Pulse: 80  Temp: 97.6 F (36.4 C)  TempSrc: Temporal  SpO2: 98%  Weight: 277 lb 9.6 oz (125.9 kg)  Height: 6\' 1"  (1.854 m)   Body mass index is 36.62 kg/m.   General: Well developed, well nourished. No acute distress. Psych: Alert and oriented. Normal mood and affect.  Health Maintenance Due  Topic Date Due   Zoster Vaccines- Shingrix (1 of 2) Never done   COVID-19 Vaccine (3 - Booster for Pfizer series) 06/03/2019   Lab Results: BMP Latest Ref Rng & Units 12/20/2020 06/13/2020 12/09/2019  Glucose 70 - 99 mg/dL 99 72 124(H)  BUN 6 - 23 mg/dL 18 17 20   Creatinine 0.40 - 1.50 mg/dL 0.99 1.21(H) 1.22  BUN/Creat Ratio 6 - 22 (calc) - 14 16  Sodium 135 - 145 mEq/L 135 139 138   Potassium 3.5 - 5.1 mEq/L 4.3 4.5 4.1  Chloride 96 - 112 mEq/L 100 102 98  CO2 19 - 32 mEq/L 28 28 27   Calcium 8.4 - 10.5 mg/dL 9.0 9.4 9.6   Lab Results  Component Value Date   CHOL 185 12/20/2020   HDL 77.30 12/20/2020   LDLCALC 71 12/20/2020   TRIG 180.0 (H) 12/20/2020   CHOLHDL 2 12/20/2020   STOP-Bang Score for Sleep Apnea Screening  Patient Self-Reported Questions         Score Do  you snore loudly? (louder than  1  talking or sufficiently loud to be  heard through doors)  Do you feel often feel tired, fatigued, or 1  sleepy during the daytime?  Has anyone observed you stop breathing 0  during sleep?  Do you have (or are you being treated for) 1  high blood pressure?  Clinical Information          Score BMI>35 kg/m2     1  Age > 50 years    1  Neck circumference > 40 cm   1  Gender (male)     1   Total      7  A score <3 indicates a low risk of sleep apnea.    Assessment & Plan:   1. Essential hypertension Blood pressure is at goal. I recommend Jordan Hines continue his lisinopril and metoprolol. We will plan for an annual BMP in the Fall. I do recommend he start to engage in regular physical activity.  2. Non-allergic rhinitis Likely, he will need to continue to manage with his nasal saline washes, azelastine, Flonase, and Atrovent nasal sprays. He should consider smoking cessation, since he feels this worsens the symptoms.  3. Acute gouty arthritis Stable on allopurinol.  4. History of prostate cancer Continue yearly monitoring with urology.  5. Burning with urination We will check a  UA today and prescribe antibiotics if appropriate.  - Urinalysis w microscopic + reflex cultur  6. Snoring 7. Fatigue, unspecified type Jordan Hines symptoms may represent sleep apnea. His STOP-Bang score is high. I will refer him for a sleep study to assess.  - Ambulatory referral to Sleep Studies  8. Depression, major, single episode, complete remission ([facility]) I  agree with Jordan Hines that he hay no longer have depression. I recommend he reduce his sertraline to 1/2 tab daily for two weeks and then stop. If his symptoms recur, he should let me know.  9. Tobacco use disorder, continuous I advised him tos top smoking. With his low level of use, he would not be a candidate for medications to assist with cessation.  Haydee Salter, MD  Addendum:  Component Ref Range & Units 4 d ago 3 mo ago 5 mo ago  Color, Urine YELLOW YELLOW     APPearance CLEAR CLOUDY Abnormal     R   Specific Gravity, Urine 1.001 - 1.035 1.015     pH 5.0 - 8.0 5.5     Glucose, UA NEGATIVE NEGATIVE     Bilirubin Urine NEGATIVE NEGATIVE     Ketones, ur NEGATIVE NEGATIVE     Hgb urine dipstick NEGATIVE TRACE Abnormal      Protein, ur NEGATIVE NEGATIVE     Nitrites, Initial NEGATIVE POSITIVE Abnormal      Leukocyte Esterase NEGATIVE 3+ Abnormal      WBC, UA 0 - 5 /HPF > OR = 60 Abnormal   21-50/hpf Abnormal  R    RBC / HPF 0 - 2 /HPF NONE SEEN  3-6/hpf Abnormal  R    Squamous Epithelial / LPF < OR = 5 /HPF 0-5  Rare(0-4/hpf) R    Bacteria, UA NONE SEEN /HPF MANY Abnormal   Many(>50/hpf) Abnormal  R    Hyaline Cast NONE SEEN /LPF 0-5 Abnormal       Component 4 d ago  MICRO NUMBER: [id number]   SPECIMEN QUALITY: Adequate   Sample Source URINE   STATUS: FINAL   ISOLATE 1: Enterobacter cloacae complex Abnormal  Comment: Greater than 100,000 CFU/mL of Enterobacter cloacae complex  Resulting Agency [facility]      Susceptibility   Enterobacter cloacae complex    URINE CULTURE, REFLEX    AMOX/CLAVULANIC 16  Resistant    CEFAZOLIN >=64  Resistant 1    CEFEPIME <=1  Sensitive    CEFTAZIDIME <=1  Sensitive    CEFTRIAXONE <=1  Sensitive    CIPROFLOXACIN <=0.25  Sensitive    GENTAMICIN <=1  Sensitive    IMIPENEM <=0.25  Sensitive    LEVOFLOXACIN <=0.12  Sensitive    NITROFURANTOIN 32  Sensitive    PIP/TAZO <=4  Sensitive    TOBRAMYCIN <=1  Sensitive     TRIMETH/SULFA <=20  Sensitive 2          Acute cystitis without hematuria  - sulfamethoxazole-trimethoprim (BACTRIM DS) 800-160 MG tablet; Take 1 tablet by mouth 2 (two) times daily.  Dispense: 14 tablet; Refill: 0

## 2021-03-25 ENCOUNTER — Other Ambulatory Visit: Payer: Self-pay

## 2021-03-25 DIAGNOSIS — J310 Chronic rhinitis: Secondary | ICD-10-CM

## 2021-03-25 MED ORDER — IPRATROPIUM BROMIDE 0.06 % NA SOLN: 1.0000 | Freq: Three times a day (TID) | NASAL | 3 refills | Status: DC

## 2021-03-26 LAB — URINALYSIS W MICROSCOPIC + REFLEX CULTURE
Bilirubin Urine: NEGATIVE
Glucose, UA: NEGATIVE
Ketones, ur: NEGATIVE
Nitrites, Initial: POSITIVE — AB
Protein, ur: NEGATIVE
RBC / HPF: NONE SEEN /HPF (ref 0–2)
Specific Gravity, Urine: 1.015 (ref 1.001–1.035)
WBC, UA: >=60 /HPF — AB (ref 0–5)
pH: 5.5 (ref 5.0–8.0)

## 2021-03-26 LAB — URINE CULTURE
MICRO NUMBER:: 12996266
SPECIMEN QUALITY:: ADEQUATE

## 2021-03-26 LAB — CULTURE INDICATED

## 2021-03-26 MED ORDER — SULFAMETHOXAZOLE-TRIMETHOPRIM 800-160 MG PO TABS: 1.0000 | ORAL_TABLET | Freq: Two times a day (BID) | ORAL | 0 refills | Status: DC

## 2021-03-26 NOTE — Addendum Note (Signed)
Addended by: Haydee Salter on: 03/26/2021 11:52 AM   Modules accepted: Orders

## 2021-04-09 ENCOUNTER — Ambulatory Visit (INDEPENDENT_AMBULATORY_CARE_PROVIDER_SITE_OTHER): Payer: Medicare HMO | Admitting: Family Medicine

## 2021-04-09 ENCOUNTER — Encounter: Payer: Self-pay | Admitting: Family Medicine

## 2021-04-09 ENCOUNTER — Other Ambulatory Visit: Payer: Self-pay

## 2021-04-09 VITALS — BP 124/82 | HR 67 | Temp 97.7°F | Ht 73.0 in | Wt 274.0 lb

## 2021-04-09 DIAGNOSIS — N390 Urinary tract infection, site not specified: Secondary | ICD-10-CM | POA: Diagnosis not present

## 2021-04-09 NOTE — Progress Notes (Signed)
[facility] [address] [place] [address] Dept: [phone] Dept Fax: [phone]  Office Visit  Subjective:    Patient ID: Jordan Hines, male    DOB: [birth year], 71 y.o..   MRN: [id number]  Chief Complaint  Patient presents with   Follow-up    F/u meds.   No concerns.     History of Present Illness:  Mr. Ostermiller has a history of prostate cancer. He underwent a prostatectomy in 2011. He has no evidence of recurrence. However, he does struggle with some chronic or recurrent cystitis. He notes that currently he is having some increased symptoms. He typically gets up 2-3 times a night to urinate, but when infection is present, this increases. He has some ED associated with his surgery and uses sildenafil for this. At his recent visit, he was found to have an Enterobacter cloacae infection. He had infection with the same organism last Aug. This is his 3rd or 4th bladder infection in the last year. He feels his dysuria is resolved currently.  Past Medical History: Patient Active Problem List   Diagnosis Date Noted   Erectile dysfunction 03/22/2021   History of prostate cancer 03/22/2021   Tobacco use disorder, continuous 03/22/2021   Acute cystitis with hematuria 12/20/2020   Burning with urination 12/20/2020   Essential hypertension 12/19/2020   BMI 36.0-36.9,adult 12/19/2020   Acute gouty arthritis 02/26/2018   Non-allergic rhinitis 01/06/2018   Past Surgical History:  Procedure Laterality Date   COLONOSCOPY  2000   Dr.Orr "normal" per pt-no report    INGUINAL HERNIA REPAIR Right    NASAL SEPTOPLASTY W/ TURBINOPLASTY Bilateral 10/19/2018   Procedure: NASAL SEPTOPLASTY WITH  BILATERAL TURBINATE REDUCTION;  Surgeon: [name], MD;  Location: [place];  Service: ENT;  Laterality: Bilateral;   PROSTATE SURGERY     Family History  Problem Relation Age of Onset   Kidney disease Mother    Heart  disease Mother    Asthma Mother    Cancer Father        Non-Hodgkins lymphoma   Diabetes Father    Cancer Sister        Bladder   Colon polyps Brother    Heart disease Maternal Grandmother    Heart disease Paternal Grandmother    Diabetes Niece    Diabetes Nephew    Allergic rhinitis Neg Hx    Eczema Neg Hx    Urticaria Neg Hx    Colon cancer Neg Hx    Esophageal cancer Neg Hx    Rectal cancer Neg Hx    Stomach cancer Neg Hx    Outpatient Medications Prior to Visit  Medication Sig Dispense Refill   allopurinol (ZYLOPRIM) 100 MG tablet TAKE 1 TABLET EVERY DAY 90 tablet 2   azelastine (ASTELIN) 0.1 % nasal spray Place 1 spray into both nostrils 2 (two) times daily. Use in each nostril as directed 30 mL 12   cyanocobalamin 1000 MCG tablet Take 1,000 mcg by mouth daily.     fluticasone (FLONASE) 50 MCG/ACT nasal spray      ipratropium (ATROVENT) 0.06 % nasal spray Place 1 spray into both nostrils 3 (three) times daily. 15 mL 3   lisinopril (ZESTRIL) 10 MG tablet TAKE 1 TABLET EVERY DAY 90 tablet 1   metoprolol succinate (TOPROL-XL) 25 MG 24 hr tablet TAKE 1 TABLET EVERY DAY 90 tablet 1   Multiple Vitamin (MULTIVITAMIN WITH MINERALS) TABS tablet Take 1 tablet by  mouth daily.     sertraline (ZOLOFT) 50 MG tablet TAKE 1 TABLET EVERY DAY 90 tablet 2   sildenafil (VIAGRA) 100 MG tablet Take 100 mg by mouth daily.     sulfamethoxazole-trimethoprim (BACTRIM DS) 800-160 MG tablet Take 1 tablet by mouth 2 (two) times daily. 14 tablet 0   No facility-administered medications prior to visit.   No Known Allergies    Objective:   Today's Vitals   04/09/21 1554  BP: 124/82  Pulse: 67  Temp: 97.7 F (36.5 C)  TempSrc: Temporal  SpO2: 98%  Weight: 274 lb (124.3 kg)  Height: 6\' 1"  (1.854 m)   Body mass index is 36.15 kg/m.   General: Well developed, well nourished. No acute distress. Psych: Alert and oriented. Normal mood and affect.  Health Maintenance Due  Topic Date Due    Zoster Vaccines- Shingrix (1 of 2) Never done   COVID-19 Vaccine (3 - Booster for [employer] series) 06/03/2019    Lab Results Component 6 mo ago  MICRO NUMBER: [id number]   SPECIMEN QUALITY: Adequate   Sample Source NOT GIVEN   STATUS: FINAL   ISOLATE 1: Enterobacter cloacae complex Abnormal    Comment: Greater than 100,000 CFU/mL of Enterobacter cloacae complex     Susceptibility   Enterobacter cloacae complex    URINE CULTURE, REFLEX    AMOX/CLAVULANIC >=32  Resistant    CEFAZOLIN >=64  Resistant 1    CEFEPIME <=1  Sensitive    CEFTAZIDIME <=1  Sensitive    CEFTRIAXONE <=1  Sensitive    CIPROFLOXACIN <=0.25  Sensitive    GENTAMICIN <=1  Sensitive    IMIPENEM <=0.25  Sensitive    LEVOFLOXACIN <=0.12  Sensitive    NITROFURANTOIN 32  Sensitive    PIP/TAZO <=4  Sensitive    TOBRAMYCIN <=1  Sensitive    TRIMETH/SULFA <=20  Sensitive 2    Assessment & Plan:   1. Recurrent UTI I will repeat a urine culture today to assess if we did effectively clear his infection. If persistent, I will refer him to urology for further evaluation.  - Urine Culture   Return for As scheduled.   Haydee Salter, MD

## 2021-04-12 LAB — URINE CULTURE
MICRO NUMBER:: 13067871
SPECIMEN QUALITY:: ADEQUATE

## 2021-05-06 ENCOUNTER — Telehealth: Payer: Self-pay | Admitting: Family Medicine

## 2021-05-06 NOTE — Telephone Encounter (Signed)
Left message for patient to call back and schedule Medicare Annual Wellness Visit (AWV) in office.  ? ?If not able to come in office, please offer to do virtually or by telephone.  Left office number and my jabber [phone]. ? ?Last AWV:04/14/2018 ? ?Please schedule at anytime with Nurse Health Advisor. ?  ?

## 2021-05-13 ENCOUNTER — Telehealth: Payer: Self-pay | Admitting: Family Medicine

## 2021-05-13 NOTE — Telephone Encounter (Signed)
Pt called requesting referral for the floaters in his eye and runny nose.  ? ? ?

## 2021-05-13 NOTE — Telephone Encounter (Signed)
Spoke to patient, he will call his Eye doctor to get an appointment and advised him to use the Zyrtec or Claritin-D for a couple weeks if no better will call back. Dm/cma ? ? ?

## 2021-05-24 ENCOUNTER — Telehealth: Payer: Self-pay | Admitting: Family Medicine

## 2021-05-24 NOTE — Telephone Encounter (Signed)
Left message for patient to call back and schedule Medicare Annual Wellness Visit (AWV).   Please offer to do virtually or by telephone.  Left office number and my jabber #[phone].  Last AWV:04/14/2018  Please schedule at anytime with Nurse Health Advisor.   

## 2021-06-07 ENCOUNTER — Other Ambulatory Visit: Payer: Self-pay | Admitting: Family Medicine

## 2021-06-07 DIAGNOSIS — J310 Chronic rhinitis: Secondary | ICD-10-CM

## 2021-06-11 ENCOUNTER — Ambulatory Visit: Payer: Medicare HMO

## 2021-06-11 ENCOUNTER — Ambulatory Visit (INDEPENDENT_AMBULATORY_CARE_PROVIDER_SITE_OTHER): Payer: Medicare HMO

## 2021-06-11 ENCOUNTER — Telehealth: Payer: Self-pay

## 2021-06-11 VITALS — Ht 72.0 in | Wt 274.0 lb

## 2021-06-11 DIAGNOSIS — J310 Chronic rhinitis: Secondary | ICD-10-CM

## 2021-06-11 DIAGNOSIS — Z0000 Encounter for general adult medical examination without abnormal findings: Secondary | ICD-10-CM | POA: Diagnosis not present

## 2021-06-11 NOTE — Patient Instructions (Signed)
Jordan Hines , ?Thank you for taking time to come for your Medicare Wellness Visit. I appreciate your ongoing commitment to your health goals. Please review the following plan we discussed and let me know if I can assist you in the future.  ? ?Screening recommendations/referrals: ?Colonoscopy: Done 06/29/2019 Repeat every 7 years. ? ?Recommended yearly ophthalmology/optometry visit for glaucoma screening and checkup ?Recommended yearly dental visit for hygiene and checkup ? ?Vaccinations: ?Influenza vaccine: Done 12/29/2020 Repeat annually ? ?Pneumococcal vaccine: Done 09/17/2010, 09/04/2014 and 03/27/2017 ?Tdap vaccine: Done 08/21/2008 Repeat in 10 years ? ?Shingles vaccine: Discussed.  ?Covid-19: 04/08/2019 and 04/08/2019 ? ?Advanced directives: Advance directive discussed with you today. Even though you declined this today, please call our office should you change your mind, and we can give you the proper paperwork for you to fill out.  ? ?Conditions/risks identified: Aim for 30 minutes of exercise or brisk walking, 6-8 glasses of water, and 5 servings of fruits and vegetables each day. ?KEEP UP THE GOOD WORK!! ? ?Next appointment: Follow up in one year for your annual wellness visit. 06/17/2022 @ 3:45 PM.  ? ?Preventive Care 73 Years and Older, Male ? ?Preventive care refers to lifestyle choices and visits with your health care provider that can promote health and wellness. ?What does preventive care include? ?A yearly physical exam. This is also called an annual well check. ?Dental exams once or twice a year. ?Routine eye exams. Ask your health care provider how often you should have your eyes checked. ?Personal lifestyle choices, including: ?Daily care of your teeth and gums. ?Regular physical activity. ?Eating a healthy diet. ?Avoiding tobacco and drug use. ?Limiting alcohol use. ?Practicing safe sex. ?Taking low doses of aspirin every day. ?Taking vitamin and mineral supplements as recommended by your health care  provider. ?What happens during an annual well check? ?The services and screenings done by your health care provider during your annual well check will depend on your age, overall health, lifestyle risk factors, and family history of disease. ?Counseling  ?Your health care provider may ask you questions about your: ?Alcohol use. ?Tobacco use. ?Drug use. ?Emotional well-being. ?Home and relationship well-being. ?Sexual activity. ?Eating habits. ?History of falls. ?Memory and ability to understand (cognition). ?Work and work Statistician. ?Screening  ?You may have the following tests or measurements: ?Height, weight, and BMI. ?Blood pressure. ?Lipid and cholesterol levels. These may be checked every 5 years, or more frequently if you are over 73 years old. ?Skin check. ?Lung cancer screening. You may have this screening every year starting at age 73 if you have a 30-pack-year history of smoking and currently smoke or have quit within the past 15 years. ?Fecal occult blood test (FOBT) of the stool. You may have this test every year starting at age 73. ?Flexible sigmoidoscopy or colonoscopy. You may have a sigmoidoscopy every 5 years or a colonoscopy every 10 years starting at age 73. ?Prostate cancer screening. Recommendations will vary depending on your family history and other risks. ?Hepatitis C blood test. ?Hepatitis B blood test. ?Sexually transmitted disease (STD) testing. ?Diabetes screening. This is done by checking your blood sugar (glucose) after you have not eaten for a while (fasting). You may have this done every 1-3 years. ?Abdominal aortic aneurysm (AAA) screening. You may need this if you are a current or former smoker. ?Osteoporosis. You may be screened starting at age 73 if you are at high risk. ?Talk with your health care provider about your test results, treatment options, and if  necessary, the need for more tests. ?Vaccines  ?Your health care provider may recommend certain vaccines, such  as: ?Influenza vaccine. This is recommended every year. ?Tetanus, diphtheria, and acellular pertussis (Tdap, Td) vaccine. You may need a Td booster every 10 years. ?Zoster vaccine. You may need this after age 73. ?Pneumococcal 13-valent conjugate (PCV13) vaccine. One dose is recommended after age 73. ?Pneumococcal polysaccharide (PPSV23) vaccine. One dose is recommended after age 73. ?Talk to your health care provider about which screenings and vaccines you need and how often you need them. ?This information is not intended to replace advice given to you by your health care provider. Make sure you discuss any questions you have with your health care provider. ?Document Released: 02/23/2015 Document Revised: 10/17/2015 Document Reviewed: 11/28/2014 ?Elsevier Interactive Patient Education ? 2017 [employer]. ? ?Fall Prevention in the Home ?Falls can cause injuries. They can happen to people of all ages. There are many things you can do to make your home safe and to help prevent falls. ?What can I do on the outside of my home? ?Regularly fix the edges of walkways and driveways and fix any cracks. ?Remove anything that might make you trip as you walk through a door, such as a raised step or threshold. ?Trim any bushes or trees on the path to your home. ?Use bright outdoor lighting. ?Clear any walking paths of anything that might make someone trip, such as rocks or tools. ?Regularly check to see if handrails are loose or broken. Make sure that both sides of any steps have handrails. ?Any raised decks and porches should have guardrails on the edges. ?Have any leaves, snow, or ice cleared regularly. ?Use sand or salt on walking paths during winter. ?Clean up any spills in your garage right away. This includes oil or grease spills. ?What can I do in the bathroom? ?Use night lights. ?Install grab bars by the toilet and in the tub and shower. Do not use towel bars as grab bars. ?Use non-skid mats or decals in the tub or  shower. ?If you need to sit down in the shower, use a plastic, non-slip stool. ?Keep the floor dry. Clean up any water that spills on the floor as soon as it happens. ?Remove soap buildup in the tub or shower regularly. ?Attach bath mats securely with double-sided non-slip rug tape. ?Do not have throw rugs and other things on the floor that can make you trip. ?What can I do in the bedroom? ?Use night lights. ?Make sure that you have a light by your bed that is easy to reach. ?Do not use any sheets or blankets that are too big for your bed. They should not hang down onto the floor. ?Have a firm chair that has side arms. You can use this for support while you get dressed. ?Do not have throw rugs and other things on the floor that can make you trip. ?What can I do in the kitchen? ?Clean up any spills right away. ?Avoid walking on wet floors. ?Keep items that you use a lot in easy-to-reach places. ?If you need to reach something above you, use a strong step stool that has a grab bar. ?Keep electrical cords out of the way. ?Do not use floor polish or wax that makes floors slippery. If you must use wax, use non-skid floor wax. ?Do not have throw rugs and other things on the floor that can make you trip. ?What can I do with my stairs? ?Do not leave any items  on the stairs. ?Make sure that there are handrails on both sides of the stairs and use them. Fix handrails that are broken or loose. Make sure that handrails are as long as the stairways. ?Check any carpeting to make sure that it is firmly attached to the stairs. Fix any carpet that is loose or worn. ?Avoid having throw rugs at the top or bottom of the stairs. If you do have throw rugs, attach them to the floor with carpet tape. ?Make sure that you have a light switch at the top of the stairs and the bottom of the stairs. If you do not have them, ask someone to add them for you. ?What else can I do to help prevent falls? ?Wear shoes that: ?Do not have high heels. ?Have  rubber bottoms. ?Are comfortable and fit you well. ?Are closed at the toe. Do not wear sandals. ?If you use a stepladder: ?Make sure that it is fully opened. Do not climb a closed stepladder. ?Make sure that both sides of

## 2021-06-11 NOTE — Progress Notes (Signed)
? ?Subjective:  ? Jordan Hines is a 71 y.o. male who presents for an Initial Medicare Annual Wellness Visit. ?Virtual Visit via Telephone Note ? ?I connected with  Jordan Hines on 06/11/21 at  3:45 PM EDT by telephone and verified that I am speaking with the correct person using two identifiers. ? ?Location: ?Patient: HOME ?Provider: [facility]. ?Persons participating in the virtual visit: patient/Nurse Health Advisor ?  ?I discussed the limitations, risks, security and privacy concerns of performing an evaluation and management service by telephone and the availability of in person appointments. The patient expressed understanding and agreed to proceed. ? ?Interactive audio and video telecommunications were attempted between this nurse and patient, however failed, due to patient having technical difficulties OR patient did not have access to video capability.  We continued and completed visit with audio only. ? ?Some vital signs may be absent or patient reported.  ? ?Jordan Driver, Jordan Hines  ?Review of Systems    ? ?Cardiac Risk Factors include: advanced age (>37mn, >>30women);hypertension;male gender;smoking/ tobacco exposure;sedentary lifestyle;obesity (BMI >30kg/m2) ? ?   ?Objective:  ?  ?Today's Vitals  ? 06/11/21 1556  ?Weight: 274 lb (124.3 kg)  ?Height: 6' (1.829 m)  ? ?Body mass index is 37.16 kg/m?. ? ? ?  06/11/2021  ?  4:05 PM 08/28/2020  ?  1:59 PM 06/21/2019  ? 11:01 AM 10/19/2018  ?  7:55 AM 10/12/2018  ?  4:20 PM 03/27/2017  ? 11:36 AM 09/04/2014  ?  2:22 PM  ?Advanced Directives  ?Does Patient Have a Medical Advance Directive? No No No No No No No  ?Would patient like information on creating a medical advance directive? No - Patient declined Yes (MAU/Ambulatory/Procedural Areas - Information given) Yes (ED - Information included in AVS) No - Patient declined No - Patient declined Yes (MAU/Ambulatory/Procedural Areas - Information given) Yes - Educational materials given  ? ? ?Current  Medications (verified) ?Outpatient Encounter Medications as of 06/11/2021  ?Medication Sig  ? allopurinol (ZYLOPRIM) 100 MG tablet TAKE 1 TABLET EVERY DAY  ? azelastine (ASTELIN) 0.1 % nasal spray Place 1 spray into both nostrils 2 (two) times daily. Use in each nostril as directed  ? cyanocobalamin 1000 MCG tablet Take 1,000 mcg by mouth daily.  ? fluticasone (FLONASE) 50 MCG/ACT nasal spray   ? ipratropium (ATROVENT) 0.06 % nasal spray USE 1 SPRAY IN EACH NOSTRIL THREE TIMES DAILY  ? lisinopril (ZESTRIL) 10 MG tablet TAKE 1 TABLET EVERY DAY  ? metoprolol succinate (TOPROL-XL) 25 MG 24 hr tablet TAKE 1 TABLET EVERY DAY  ? Multiple Vitamin (MULTIVITAMIN WITH MINERALS) TABS tablet Take 1 tablet by mouth daily.  ? sertraline (ZOLOFT) 50 MG tablet TAKE 1 TABLET EVERY DAY  ? sildenafil (VIAGRA) 100 MG tablet Take 100 mg by mouth daily.  ? sulfamethoxazole-trimethoprim (BACTRIM DS) 800-160 MG tablet Take 1 tablet by mouth 2 (two) times daily. (Patient not taking: Reported on 06/11/2021)  ? ?No facility-administered encounter medications on file as of 06/11/2021.  ? ? ?Allergies (verified) ?Patient has no known allergies.  ? ?History: ?Past Medical History:  ?Diagnosis Date  ? Anxiety   ? Arthritis   ? Cancer ([facility] 2011  ? Prostate cancer  sx in 2011  ? Depression   ? Deviated septum   ? Hypertension   ? Nasal turbinate hypertrophy   ? ?Past Surgical History:  ?Procedure Laterality Date  ? COLONOSCOPY  2000  ? Dr.Orr "normal" per pt-no report   ?  INGUINAL HERNIA REPAIR Right   ? NASAL SEPTOPLASTY W/ TURBINOPLASTY Bilateral 10/19/2018  ? Procedure: NASAL SEPTOPLASTY WITH  BILATERAL TURBINATE REDUCTION;  Surgeon: [name], MD;  Location: [place];  Service: ENT;  Laterality: Bilateral;  ? PROSTATE SURGERY    ? ?Family History  ?Problem Relation Age of Onset  ? Kidney disease Mother   ? Heart disease Mother   ? Asthma Mother   ? Cancer Father   ?     Non-Hodgkins lymphoma  ? Diabetes Father   ? Cancer Sister   ?      Bladder  ? Colon polyps Brother   ? Heart disease Maternal Grandmother   ? Heart disease Paternal Grandmother   ? Diabetes Niece   ? Diabetes Nephew   ? Allergic rhinitis Neg Hx   ? Eczema Neg Hx   ? Urticaria Neg Hx   ? Colon cancer Neg Hx   ? Esophageal cancer Neg Hx   ? Rectal cancer Neg Hx   ? Stomach cancer Neg Hx   ? ?Social History  ? ?Socioeconomic History  ? Marital status: Divorced  ?  Spouse name: Not on file  ? Number of children: 1  ? Years of education: Not on file  ? Highest education level: Some college, no degree  ?Occupational History  ? Not on file  ?Tobacco Use  ? Smoking status: Every Day  ?  Types: Cigars  ? Smokeless tobacco: Never  ? Tobacco comments:  ?  about 2-3 small cigars daily  ?Vaping Use  ? Vaping Use: Never used  ?Substance and Sexual Activity  ? Alcohol use: Yes  ?  Alcohol/week: 3.0 standard drinks  ?  Types: 1 Cans of beer, 2 Shots of liquor per week  ?  Comment: socially  ? Drug use: No  ? Sexual activity: Yes  ?Other Topics Concern  ? Not on file  ?Social History Narrative  ? Not on file  ? ?Social Determinants of Health  ? ?Financial Resource Strain: Not on file  ?Food Insecurity: No Food Insecurity  ? Worried About Charity fundraiser in the Last Year: Never true  ? Ran Out of Food in the Last Year: Never true  ?Transportation Needs: No Transportation Needs  ? Lack of Transportation (Medical): No  ? Lack of Transportation (Non-Medical): No  ?Physical Activity: Sufficiently Active  ? Days of Exercise per Week: 5 days  ? Minutes of Exercise per Session: 50 min  ?Stress: No Stress Concern Present  ? Feeling of Stress : Not at all  ?Social Connections: Moderately Integrated  ? Frequency of Communication with Friends and Family: More than three times a week  ? Frequency of Social Gatherings with Friends and Family: Once a week  ? Attends Religious Services: 1 to 4 times per year  ? Active Member of Clubs or Organizations: Yes  ? Attends Archivist Meetings: More than 4  times per year  ? Marital Status: Divorced  ? ? ?Tobacco Counseling ?Ready to quit: Not Answered ?Counseling given: Not Answered ?Tobacco comments: about 2-3 small cigars daily ? ? ?Clinical Intake: ? ?Pre-visit preparation completed: Yes ? ?Pain : No/denies pain ? ?  ? ?BMI - recorded: 37.16 ?Nutritional Status: BMI > 30  Obese ?Nutritional Risks: None ?Diabetes: No ? ?How often do you need to have someone help you when you read instructions, pamphlets, or other written materials from your doctor or pharmacy?: 1 - Never ? ?Diabetic?NO ? ?Interpreter  Needed?: No ? ?Information entered by :: Jordan Ciearra Rufo, Jordan Hines ? ? ?Activities of Daily Living ? ?  06/11/2021  ?  4:07 PM  ?In your present state of health, do you have any difficulty performing the following activities:  ?Hearing? 0  ?Vision? 0  ?Difficulty concentrating or making decisions? 1  ?Comment Memory at times.  ?Walking or climbing stairs? 0  ?Dressing or bathing? 0  ?Doing errands, shopping? 0  ?Preparing Food and eating ? N  ?Using the Toilet? N  ?In the past six months, have you accidently leaked urine? Y  ?Comment at times due to prostate ca surgery.  ?Do you have problems with loss of bowel control? N  ?Managing your Medications? N  ?Managing your Finances? N  ?Housekeeping or managing your Housekeeping? N  ? ? ?Patient Care Team: ?Haydee Salter, MD as PCP - General (Family Medicine) ?[place], [facility] ?Garnet Sierras, DO as Consulting Physician (Allergy) ?[name], MD as Consulting Physician (Otolaryngology) ?Jettie Booze, MD as Consulting Physician (Cardiology) ? ?Indicate any recent Medical Services you may have received from other than [facility] providers in the past year (date may be approximate). ? ?   ?Assessment:  ? This is a routine wellness examination for Bralyn. ? ?Hearing/Vision screen ?Hearing Screening - Comments:: No hearing issues.  ?Vision Screening - Comments:: Pt states he has been seeing floaters. Referral requested.  Glasses.  ? ?Dietary issues and exercise activities discussed: ?Current Exercise Habits: Home exercise routine;Structured exercise class, Type of exercise: treadmill;walking;strength training/weights, Time (

## 2021-06-11 NOTE — Addendum Note (Signed)
Addended by: Haydee Salter on: 06/11/2021 06:09 PM ? ? Modules accepted: Orders ? ?

## 2021-06-11 NOTE — Telephone Encounter (Signed)
During pt c/o issues with sinus congestion/runny nose. Pt states he has tried otc meds, navage but with out relief. Pt states he has seen an allergist in the past and would like to be referred back to one, if possible. Thank you.  ?

## 2021-06-19 ENCOUNTER — Ambulatory Visit (INDEPENDENT_AMBULATORY_CARE_PROVIDER_SITE_OTHER): Payer: Medicare HMO | Admitting: Family Medicine

## 2021-06-19 VITALS — BP 138/82 | HR 85 | Temp 97.7°F | Ht 72.0 in | Wt 270.6 lb

## 2021-06-19 DIAGNOSIS — J310 Chronic rhinitis: Secondary | ICD-10-CM | POA: Diagnosis not present

## 2021-06-19 DIAGNOSIS — I10 Essential (primary) hypertension: Secondary | ICD-10-CM

## 2021-06-19 DIAGNOSIS — F325 Major depressive disorder, single episode, in full remission: Secondary | ICD-10-CM

## 2021-06-19 MED ORDER — PREDNISONE 20 MG PO TABS: 20.0000 mg | ORAL_TABLET | Freq: Every day | ORAL | 0 refills | Status: DC

## 2021-06-19 NOTE — Progress Notes (Signed)
?[facility] ?[facility] ?[place] ?[place] [address] ?Dept: [phone] ?Dept Fax: [phone] ? ?Chronic Care Office Visit ? ?Subjective:  ? ? Patient ID: Jordan Hines, male    DOB: Feb 21, 1950, 71 y.o..   MRN: [id number] ? ?Chief Complaint  ?Patient presents with  ? Follow-up  ?  3 month f/u.  BP @ home 120/70  ? ? ?History of Present Illness: ? ?Patient is in today for reassessment of chronic medical issues. ? ?Jordan Hines has a history of hypertension. He is managed on lisinopril and metoprolol. ?  ?Jordan Hines has a history of non-allergic rhinitis. He has had prior sinus surgery. He is managed on azelastine, fluticasone, and ipratropium spray. He finds that the approaches he has tried have not resolved his issue. He was previously seen by ENT, but feels they did not make headway. He has na upcoming visit with an allergist to assess. He feels the nasal congestion and the post-nasal drip interfere with his sleep and are contributing to a lack of energy. ? ?Jordan Hines has a history of depression. He is currently managed on sertraline 50 mg daily. He feels his depression is doing quite well. He would like a trial off of his medication. ? ?Past Medical History: ?Patient Active Problem List  ? Diagnosis Date Noted  ? Erectile dysfunction 03/22/2021  ? History of prostate cancer 03/22/2021  ? Tobacco use disorder, continuous 03/22/2021  ? Essential hypertension 12/19/2020  ? Obesity (BMI 35.0-39.9 without comorbidity) 12/19/2020  ? Acute gouty arthritis 02/26/2018  ? Non-allergic rhinitis 01/06/2018  ? ?Past Surgical History:  ?Procedure Laterality Date  ? COLONOSCOPY  2000  ? Dr.Orr "normal" per pt-no report   ? INGUINAL HERNIA REPAIR Right   ? NASAL SEPTOPLASTY W/ TURBINOPLASTY Bilateral 10/19/2018  ? Procedure: NASAL SEPTOPLASTY WITH  BILATERAL TURBINATE REDUCTION;  Surgeon: [name], MD;  Location: [facility];  Service: ENT;  Laterality:  Bilateral;  ? PROSTATE SURGERY    ? ?Family History  ?Problem Relation Age of Onset  ? Kidney disease Mother   ? Heart disease Mother   ? Asthma Mother   ? Cancer Father   ?     Non-Hodgkins lymphoma  ? Diabetes Father   ? Cancer Sister   ?     Bladder  ? Colon polyps Brother   ? Heart disease Maternal Grandmother   ? Heart disease Paternal Grandmother   ? Diabetes Niece   ? Diabetes Nephew   ? Allergic rhinitis Neg Hx   ? Eczema Neg Hx   ? Urticaria Neg Hx   ? Colon cancer Neg Hx   ? Esophageal cancer Neg Hx   ? Rectal cancer Neg Hx   ? Stomach cancer Neg Hx   ? ?Outpatient Medications Prior to Visit  ?Medication Sig Dispense Refill  ? allopurinol (ZYLOPRIM) 100 MG tablet TAKE 1 TABLET EVERY DAY 90 tablet 2  ? azelastine (ASTELIN) 0.1 % nasal spray Place 1 spray into both nostrils 2 (two) times daily. Use in each nostril as directed 30 mL 12  ? cyanocobalamin 1000 MCG tablet Take 1,000 mcg by mouth daily.    ? fluticasone (FLONASE) 50 MCG/ACT nasal spray     ? ipratropium (ATROVENT) 0.06 % nasal spray USE 1 SPRAY IN EACH NOSTRIL THREE TIMES DAILY 60 mL 1  ? lisinopril (ZESTRIL) 10 MG tablet TAKE 1 TABLET EVERY DAY 90 tablet 1  ? metoprolol succinate (TOPROL-XL) 25 MG 24 hr  tablet TAKE 1 TABLET EVERY DAY 90 tablet 1  ? Multiple Vitamin (MULTIVITAMIN WITH MINERALS) TABS tablet Take 1 tablet by mouth daily.    ? sertraline (ZOLOFT) 50 MG tablet TAKE 1 TABLET EVERY DAY 90 tablet 2  ? sildenafil (VIAGRA) 100 MG tablet Take 100 mg by mouth daily.    ? sulfamethoxazole-trimethoprim (BACTRIM DS) 800-160 MG tablet Take 1 tablet by mouth 2 (two) times daily. (Patient not taking: Reported on 06/11/2021) 14 tablet 0  ? ?No facility-administered medications prior to visit.  ? ?No Known Allergies ?   ?Objective:  ? ?Today's Vitals  ? 06/19/21 1337  ?BP: 138/82  ?Pulse: 85  ?Temp: 97.7 ?F (36.5 ?C)  ?TempSrc: Temporal  ?SpO2: 96%  ?Weight: 270 lb 9.6 oz (122.7 kg)  ?Height: 6' (1.829 m)  ? ?Body mass index is 36.7 kg/m?.   ? ?General: Well developed, well nourished. No acute distress. ?Psych: Alert and oriented. Normal mood and affect. ? ?Health Maintenance Due  ?Topic Date Due  ? Zoster Vaccines- Shingrix (1 of 2) Never done  ? TETANUS/TDAP  08/22/2018  ?   ?Assessment & Plan:  ? ?1. Essential hypertension ?BP in the clinic is higher than goal, but Jordan Hines notes his home blood pressures are 120/70.  I recommend he continue to measure this regularly. We will continue him on lisinopril 10 mg daily and metoprolol XL 25 mg daily. ? ?2. Chronic rhinitis ?Although the record indicates his issues are non-allergic, I would like to try him on a trial of prednisone to see if he gets any improvement with this. Otherwise, he should continue his Flonase, azelastine, and ipratropium. He will see the allergist next month. ? ?- predniSONE (DELTASONE) 20 MG tablet; Take 1 tablet (20 mg total) by mouth daily with breakfast.  Dispense: 5 tablet; Refill: 0 ? ?3. Depression, major, single episode, complete remission ([place]) ?It is reasonable at this point for [place] to give Jordan Hines a trial off of his meds. I will have him taper to 25 mg (1/2 tab) daily until he completes his current prescription of sertraline. At that point he can stop his medication. I advised him to follow-up sooner if his depression starts to recur off meds. ? ?Return in about 3 months (around 09/19/2021) for Reassessment.  ? ?Haydee Salter, MD ?

## 2021-06-19 NOTE — Patient Instructions (Signed)
Start taking 1/2 tab (25 mg) of your sertraline. Once current meds run out, stop the medicine. ?

## 2021-08-13 NOTE — Progress Notes (Unsigned)
NEW PATIENT Date of Service/Encounter:  08/14/21 Referring provider: Haydee Salter, MD Primary care provider: Haydee Salter, MD  Subjective:  Jordan Hines is a 71 y.o. male with a PMHx of nonallergic rhinitis, HTN, obesity, hx of prostate cancer, gouty arthritis presenting today for evaluation of nonallergic rhinitis with nasal polyposis.  History obtained from: chart review and patient.   Patient presents today for chronic rhinitis. He uses a saline rinses but continues to have stuffy and runny nose.  He does have nasal polyps which have been treated by Jordan Hines, but has not been back for the past year for follow-up.  His symptoms are year round.  Sometimes he has a good day, but other days he is sneezing and "filling up a tissue. "   Using flonase and ipatropium daily on most days.  He will occasionally use afrin about once per week so that he can breath out of his nose.   He does feel decreased sense of smell.   He has occasional ear infections and decreased hearing as well that comes and goes. He has been given antibiotics for ear infections in the past year. He does not consider himself an allergic person and never had allergies growing up.   He is a previous patient of Jordan Hines 2019- seen for initial visit for chronic rhinitis, 2019 negative environmental allergy testing; smoker, not controlled on flonase and atrovent; he was referred to ENT  Patient followed by Jordan Hines sinus surgery and noted to have nasal polyps.  Other allergy screening: Asthma: no Food allergy: no Medication allergy: no Hymenoptera allergy: no Urticaria: no Eczema:no History of recurrent infections suggestive of immunodeficency: no Vaccinations are up to date.   Past Medical History: Past Medical History:  Diagnosis Date   Anxiety    Arthritis    Cancer ([place]) 2011   Prostate cancer  sx in 2011   Depression    Deviated septum    Hypertension    Nasal turbinate hypertrophy     Medication List:  Current Outpatient Medications  Medication Sig Dispense Refill   allopurinol (ZYLOPRIM) 100 MG tablet TAKE 1 TABLET EVERY DAY 90 tablet 2   azelastine (ASTELIN) 0.1 % nasal spray Place 1 spray into both nostrils 2 (two) times daily. Use in each nostril as directed 30 mL 12   cyanocobalamin 1000 MCG tablet Take 1,000 mcg by mouth daily.     ipratropium (ATROVENT) 0.06 % nasal spray USE 1 SPRAY IN EACH NOSTRIL THREE TIMES DAILY 60 mL 1   lisinopril (ZESTRIL) 10 MG tablet TAKE 1 TABLET EVERY DAY 90 tablet 1   metoprolol succinate (TOPROL-XL) 25 MG 24 hr tablet TAKE 1 TABLET EVERY DAY 90 tablet 1   Multiple Vitamin (MULTIVITAMIN WITH MINERALS) TABS tablet Take 1 tablet by mouth daily.     sertraline (ZOLOFT) 50 MG tablet TAKE 1 TABLET EVERY DAY 90 tablet 2   sildenafil (VIAGRA) 100 MG tablet Take 100 mg by mouth daily.     UNABLE TO FIND Take by mouth daily. Golo- wieght loss supplement     fluticasone (FLONASE) 50 MCG/ACT nasal spray  (Patient not taking: Reported on 08/14/2021)     predniSONE (DELTASONE) 20 MG tablet Take 1 tablet (20 mg total) by mouth daily with breakfast. (Patient not taking: Reported on 08/14/2021) 5 tablet 0   Current Facility-Administered Medications  Medication Dose Route Frequency Provider Last Rate Last Admin   dupilumab (DUPIXENT) prefilled syringe 300 mg  300 mg  Subcutaneous Q14 Days Jordan Chambers, MD   300 mg at 08/14/21 1534   Known Allergies:  No Known Allergies Past Surgical History: Past Surgical History:  Procedure Laterality Date   COLONOSCOPY  2000   JordanOrr "normal" per pt-no report    INGUINAL HERNIA REPAIR Right    NASAL SEPTOPLASTY W/ TURBINOPLASTY Bilateral 10/19/2018   Procedure: NASAL SEPTOPLASTY WITH  BILATERAL TURBINATE REDUCTION;  Surgeon: Jordan Baptist, MD;  Location: [facility];  Service: ENT;  Laterality: Bilateral;   PROSTATE SURGERY     Family History: Family History  Problem Relation Age of Onset   Kidney  disease Mother    Heart disease Mother    Asthma Mother    Cancer Father        Non-Hodgkins lymphoma   Diabetes Father    Cancer Sister        Bladder   Colon polyps Brother    Heart disease Maternal Grandmother    Heart disease Paternal Grandmother    Diabetes Niece    Diabetes Nephew    Allergic rhinitis Neg Hx    Eczema Neg Hx    Urticaria Neg Hx    Colon cancer Neg Hx    Esophageal cancer Neg Hx    Rectal cancer Neg Hx    Stomach cancer Neg Hx    Social History: Jordan Hines lives in a house built 56 years ago, no water damage, carpet in the bedroom, gas heating, central AC with window units, no pets, no cockroaches, dust mite protection on the bedding and pillows, he does smoke cigars since 1998 on occasion.  He calculates about once every 10 days or so.  He is retired.  He has no hobbies which exposed him to fumes chemicals or dust.  No HEPA filter in the home.  [place] interstate[place]industrial..   ROS:  All other systems negative except as noted per HPI.  Objective:  Blood pressure (!) 150/86, pulse 87, temperature 98.2 F (36.8 C), temperature source Temporal, resp. rate 16, height '5\' 11"'$  (1.803 m), weight 267 lb 3.2 oz (121.2 kg), SpO2 98 %. Body mass index is 37.27 kg/m. Physical Exam:  General Appearance:  Alert, cooperative, no distress, appears stated age  Head:  Normocephalic, without obvious abnormality, atraumatic  Eyes:  Conjunctiva clear, EOM's intact  Nose: Nares normal,  visible polyp anterior in right nare, hypertrophic turbinates, and normal mucosa  Throat: Lips, tongue normal; teeth and gums normal, normal posterior oropharynx  Neck: Supple, symmetrical  Lungs:   clear to auscultation bilaterally, Respirations unlabored, no coughing  Heart:  regular rate and rhythm and no murmur, Appears well perfused  Extremities: No edema  Skin: Skin color, texture, turgor normal, no rashes or lesions on visualized portions of skin  Neurologic: No gross deficits      Diagnostics: None  Assessment and Plan  Mr. Jordan Hines is a 71 year old male with a history of nonallergic rhinitis and nasal polyposis.  Offered repeat skin testing but based on his history suspect it will continue to be negative.  Did note nasal polyp in right nare today and he reports hyposmia.  Discussed starting Dupixent since he has already failed surgical management once.  Additionally he is not interested in undergoing further surgeries.  Nonallergic rhinitis with nasal polyposis:  - start Dupixent (dupilumab) injections for nasal polyps, sample given today.  Continue every 2-week dosing once approved - start Xhance (fluticasone) 2 sprays in each nare twice daily as needed; use in place  of current fluticasone - continue atrovent (ipatropium) nasal spray - 2 sprays in each nostril up to three times daily as needed - do not use decongestants more than 3 days in a row, use infrequently to avoid rebound symptoms -Nasal lavage twice daily or as needed, discussed using distilled water only or boiled then cooled tap water  You will get a phone call from our Biologics coordinator [place].  Please answer your phone calls.  Follow-up in 3 months, sooner if needed.  It was a pleasure meeting you in clinic today.  This note in its entirety was forwarded to the Provider who requested this consultation.  Thank you for your kind referral. I appreciate the opportunity to take part in De[place]tae's care. Please do not hesitate to contact me with questions.  Sincerely,  [name], MD [facility]

## 2021-08-14 ENCOUNTER — Ambulatory Visit (INDEPENDENT_AMBULATORY_CARE_PROVIDER_SITE_OTHER): Payer: Medicare HMO | Admitting: Internal Medicine

## 2021-08-14 ENCOUNTER — Encounter: Payer: Self-pay | Admitting: Internal Medicine

## 2021-08-14 VITALS — BP 150/86 | HR 87 | Temp 98.2°F | Resp 16 | Ht 71.0 in | Wt 267.2 lb

## 2021-08-14 DIAGNOSIS — J339 Nasal polyp, unspecified: Secondary | ICD-10-CM

## 2021-08-14 DIAGNOSIS — J310 Chronic rhinitis: Secondary | ICD-10-CM | POA: Diagnosis not present

## 2021-08-14 MED ORDER — DUPILUMAB 300 MG/2ML ~~LOC~~ SOSY
300.0000 mg | PREFILLED_SYRINGE | SUBCUTANEOUS | Status: DC
  Administered 2021-08-14 – 2021-12-19 (×7): 300 mg via SUBCUTANEOUS

## 2021-08-14 MED ORDER — XHANCE 93 MCG/ACT NA EXHU: 2.0000 | INHALANT_SUSPENSION | Freq: Two times a day (BID) | NASAL | 5 refills | Status: DC

## 2021-08-14 NOTE — Patient Instructions (Addendum)
Nonallergic rhinitis with nasal polyposis:  - start Dupixent (dupilumab) injections for nasal polyps, sample given today. - start Xhance (fluticasone) 2 sprays in each nare twice daily as needed; use in place of current fluticasone - continue atrovent (ipatropium) nasal spray - 2 sprays in each nostril up to three times daily as needed - do not use decongestants more than 3 days in a row, use infrequently to avoid rebound symptoms -Nasal lavage twice daily or as needed, discussed using distilled water only or boiled then cooled tap water  You will get a phone call from our Biologics coordinator [place].  Please answer your phone calls.  It was a pleasure meeting you in clinic today.  [name], MD [facility]

## 2021-08-19 ENCOUNTER — Telehealth: Payer: Self-pay

## 2021-08-19 NOTE — Telephone Encounter (Signed)
Patient called in - DOB verified - requesting status of Dupixent approval. Patient was advised the Biologic Coordinator is on vacation this week - once she returns she will process his Dupixent application for approval with his insurance. Patient was given procedure code [id number] per his request so he could contact his insurance company for any out of pocket responsibility for in clinic injections.   Patient asked about toll free number to contact [facility] regarding his [place] prescription. Patient was given [phone] to contact pharmacy - patient was reminded to lock/save number to ensure pharmacy is able to contact him - patient stated he would save number.  Patient verbalized understanding, no further questions.

## 2021-08-20 ENCOUNTER — Encounter: Payer: Self-pay | Admitting: Family Medicine

## 2021-08-20 ENCOUNTER — Ambulatory Visit (INDEPENDENT_AMBULATORY_CARE_PROVIDER_SITE_OTHER): Payer: Medicare HMO | Admitting: Family Medicine

## 2021-08-20 VITALS — BP 124/70 | HR 80 | Temp 97.5°F | Ht 71.0 in | Wt 266.4 lb

## 2021-08-20 DIAGNOSIS — H60501 Unspecified acute noninfective otitis externa, right ear: Secondary | ICD-10-CM | POA: Diagnosis not present

## 2021-08-20 DIAGNOSIS — B353 Tinea pedis: Secondary | ICD-10-CM | POA: Diagnosis not present

## 2021-08-20 MED ORDER — NEOMYCIN-POLYMYXIN-HC 3.5-10000-1 OT SOLN: 3.0000 [drp] | Freq: Four times a day (QID) | OTIC | 0 refills | Status: DC

## 2021-08-20 MED ORDER — CLOTRIMAZOLE 1 % EX CREA: 1.0000 | TOPICAL_CREAM | Freq: Two times a day (BID) | CUTANEOUS | 0 refills | Status: DC

## 2021-08-20 NOTE — Progress Notes (Signed)
[facility] [address] [place] [address] Dept: [phone] Dept Fax: [phone]  Office Visit  Subjective:    Patient ID: Jordan Hines, male    DOB: [birth year], 71 y.o..   MRN: [id number]  Chief Complaint  Patient presents with   Acute Visit    C/o having Rt ear pain x 2-3  weeks.  No OTC meds.  Irritation on Rt foot between toe  x 5 months.     History of Present Illness:  Patient is in today complaining of right ear pain, drainage, and decreased hearing over the past 2-3 weeks. He denies any fever and has had no new nasal symptoms. He has chronic rhinitis and is working with the allergist related tot his. He was started on Dupixent less than 2 weeks ago.  Additionally, Jordan Hines notes he has an area between his toes on his right foot that has been itching and painful. This has been present for about 5 months. He tried using an OTC medication he bought to treat itching to the sole of his foot. He is unsure of what this was, but it did not seem to work.  Past Medical History: Patient Active Problem List   Diagnosis Date Noted   Nasal polyps 08/14/2021   Erectile dysfunction 03/22/2021   History of prostate cancer 03/22/2021   Tobacco use disorder, continuous 03/22/2021   Essential hypertension 12/19/2020   Obesity (BMI 35.0-39.9 without comorbidity) 12/19/2020   Acute gouty arthritis 02/26/2018   Non-allergic rhinitis 01/06/2018   Past Surgical History:  Procedure Laterality Date   COLONOSCOPY  2000   Dr.Orr "normal" per pt-no report    INGUINAL HERNIA REPAIR Right    NASAL SEPTOPLASTY W/ TURBINOPLASTY Bilateral 10/19/2018   Procedure: NASAL SEPTOPLASTY WITH  BILATERAL TURBINATE REDUCTION;  Surgeon: Jordan Baptist, MD;  Location: [facility];  Service: ENT;  Laterality: Bilateral;   PROSTATE SURGERY     Family History  Problem Relation Age of Onset   Kidney disease Mother    Heart disease  Mother    Asthma Mother    Cancer Father        Non-Hodgkins lymphoma   Diabetes Father    Cancer Sister        Bladder   Colon polyps Brother    Heart disease Maternal Grandmother    Heart disease Paternal Grandmother    Diabetes Niece    Diabetes Nephew    Allergic rhinitis Neg Hx    Eczema Neg Hx    Urticaria Neg Hx    Colon cancer Neg Hx    Esophageal cancer Neg Hx    Rectal cancer Neg Hx    Stomach cancer Neg Hx    Outpatient Medications Prior to Visit  Medication Sig Dispense Refill   allopurinol (ZYLOPRIM) 100 MG tablet TAKE 1 TABLET EVERY DAY 90 tablet 2   azelastine (ASTELIN) 0.1 % nasal spray Place 1 spray into both nostrils 2 (two) times daily. Use in each nostril as directed 30 mL 12   cyanocobalamin 1000 MCG tablet Take 1,000 mcg by mouth daily.     Fluticasone Propionate (XHANCE) 93 MCG/ACT EXHU Place 2 sprays into the nose 2 (two) times daily. 16 mL 5   ipratropium (ATROVENT) 0.06 % nasal spray USE 1 SPRAY IN EACH NOSTRIL THREE TIMES DAILY 60 mL 1   lisinopril (ZESTRIL) 10 MG tablet TAKE 1 TABLET EVERY DAY 90 tablet 1   metoprolol succinate (TOPROL-XL) 25  MG 24 hr tablet TAKE 1 TABLET EVERY DAY 90 tablet 1   Multiple Vitamin (MULTIVITAMIN WITH MINERALS) TABS tablet Take 1 tablet by mouth daily.     sertraline (ZOLOFT) 50 MG tablet TAKE 1 TABLET EVERY DAY 90 tablet 2   sildenafil (VIAGRA) 100 MG tablet Take 100 mg by mouth daily.     UNABLE TO FIND Take by mouth daily. Golo- wieght loss supplement     predniSONE (DELTASONE) 20 MG tablet Take 1 tablet (20 mg total) by mouth daily with breakfast. (Patient not taking: Reported on 08/14/2021) 5 tablet 0   Facility-Administered Medications Prior to Visit  Medication Dose Route Frequency Provider Last Rate Last Admin   dupilumab (DUPIXENT) prefilled syringe 300 mg  300 mg Subcutaneous Q14 Days Clemon Chambers, MD   300 mg at 08/14/21 1534   No Known Allergies    Objective:   Today's Vitals   08/20/21 1553  BP: 124/70   Pulse: 80  Temp: (!) 97.5 F (36.4 C)  TempSrc: Temporal  SpO2: 94%  Weight: 266 lb 6.4 oz (120.8 kg)  Height: '5\' 11"'$  (1.803 m)   Body mass index is 37.16 kg/m.   General: Well developed, well nourished. No acute distress. HEENT: Normocephalic, non-traumatic. The right ear canal is swollen and has some mildly purulent drainage   present, though I can see through to the ear drum. The Left EAC has mild wax, but is otherwise normal. Extremities: There is considerable maceration and peeling of the skin between the right 4th and 5th toe. There   is a small area of similar changes between the right 3rd and 4th toe. Psych: Alert and oriented. Normal mood and affect.  Health Maintenance Due  Topic Date Due   Zoster Vaccines- Shingrix (1 of 2) Never done   TETANUS/TDAP  08/22/2018     Assessment & Plan:   1. Acute otitis externa of right ear, unspecified type I recommend we treat his ear with Cortisporin drops and monitor for resolution.  - neomycin-polymyxin-hydrocortisone (CORTISPORIN) OTIC solution; Place 3 drops into the right ear 4 (four) times daily.  Dispense: 10 mL; Refill: 0  2. Tinea pedis of right foot I discussed management with applying antifungal cream twice a day, tucking a cotton ball between the effected toes, and putting on dry, white cotton socks.  - clotrimazole (CLOTRIMAZOLE ANTI-FUNGAL) 1 % cream; Apply 1 Application topically 2 (two) times daily.  Dispense: 30 g; Refill: 0   Return if symptoms worsen or fail to improve.   Haydee Salter, MD

## 2021-08-20 NOTE — Patient Instructions (Signed)
Athlete's Foot Athlete's foot (tinea pedis) is a fungal infection of the skin on your feet. It often occurs on the skin that is between or underneath the toes. It can also occur on the soles of your feet. The infection can spread from person to person (is contagious). It can also spread when a person's bare feet come in contact with the fungus on shower floors or on items such as shoes. What are the causes? This condition is caused by a fungus that grows in warm, moist places. You can get athlete's foot by sharing shoes, shower stalls, towels, and wet floors with someone who is infected. Not washing your feet or changing your socks often enough can also lead to athlete's foot. What increases the risk? This condition is more likely to develop in: Men. People who have a weak body defense system (immune system). People who have diabetes. People who use public showers, such as at a gym. People who wear heavy-duty shoes, such as industrial or military shoes. Seasons with warm, humid weather. What are the signs or symptoms? Symptoms of this condition include: Itchy areas between your toes or on the soles of your feet. White, flaky, or scaly areas between your toes or on the soles of your feet. Very itchy small blisters between your toes or on the soles of your feet. Small cuts in your skin. These cuts can become infected. Thick or discolored toenails. How is this diagnosed? This condition may be diagnosed with a physical exam and a review of your medical history. Your health care provider may also take a skin or toenail sample to examine under a microscope. How is this treated? This condition is treated with antifungal medicines. These may be applied as powders, ointments, or creams. In severe cases, an oral antifungal medicine may be given. Follow these instructions at home: Medicines Apply or take over-the-counter and prescription medicines only as told by your health care provider. Apply your  antifungal medicine as told by your health care provider. Do not stop using the antifungal even if your condition improves. Foot care Do not scratch your feet. Keep your feet dry: Wear cotton or wool socks. Change your socks every day or if they become wet. Wear shoes that allow air to flow, such as sandals or canvas tennis shoes. Wash and dry your feet, including the area between your toes. Also, wash and dry your feet: Every day or as told by your health care provider. After exercising. General instructions Do not let others use towels, shoes, nail clippers, or other personal items that touch your feet. Protect your feet by wearing sandals in wet areas, such as locker rooms and shared showers. Keep all follow-up visits. This is important. If you have diabetes, keep your blood sugar under control. Contact a health care provider if: You have a fever. You have swelling, soreness, warmth, or redness in your foot. Your feet are not getting better with treatment. Your symptoms get worse. You have new symptoms. You have severe pain. Summary Athlete's foot (tinea pedis) is a fungal infection of the skin on your feet. It often occurs on skin that is between or underneath the toes. This condition is caused by a fungus that grows in warm, moist places. Symptoms include white, flaky, or scaly areas between your toes or on the soles of your feet. This condition is treated with antifungal medicines. Keep your feet clean. Always dry them thoroughly. This information is not intended to replace advice given to you by   your health care provider. Make sure you discuss any questions you have with your health care provider. Document Revised: 05/20/2020 Document Reviewed: 05/20/2020 Elsevier Patient Education  2023 Elsevier Inc.  

## 2021-08-21 DIAGNOSIS — H43391 Other vitreous opacities, right eye: Secondary | ICD-10-CM | POA: Diagnosis not present

## 2021-08-21 DIAGNOSIS — H524 Presbyopia: Secondary | ICD-10-CM | POA: Diagnosis not present

## 2021-08-21 DIAGNOSIS — H52223 Regular astigmatism, bilateral: Secondary | ICD-10-CM | POA: Diagnosis not present

## 2021-08-27 ENCOUNTER — Telehealth: Payer: Self-pay

## 2021-08-27 NOTE — Telephone Encounter (Signed)
Pa submitted thru cover my meds for xhance waiting on response form insurance key [id number]

## 2021-08-28 ENCOUNTER — Telehealth: Payer: Self-pay | Admitting: *Deleted

## 2021-08-28 ENCOUNTER — Ambulatory Visit: Payer: Medicare HMO

## 2021-08-28 NOTE — Telephone Encounter (Signed)
-----   Message from Clemon Chambers, MD sent at 08/14/2021  4:38 PM EDT ----- Hi Jordan Hines.  Can we see about getting Jordan Hines approved for Dupixent for nasal polyposis?  He has already had polypectomies in the past few years which failed.

## 2021-08-28 NOTE — Telephone Encounter (Signed)
L/M for patient to contact me to discuss Dupixenrt for nasal polyps and PAP for free drug

## 2021-08-29 NOTE — Telephone Encounter (Signed)
Patient called back and I returned call

## 2021-08-30 NOTE — Telephone Encounter (Signed)
PA has been approved for [employer]. PA has been faxed to patients pharmacy, labeled, and placed in bulk scanning.

## 2021-09-02 DIAGNOSIS — Z8546 Personal history of malignant neoplasm of prostate: Secondary | ICD-10-CM | POA: Diagnosis not present

## 2021-09-09 DIAGNOSIS — N5231 Erectile dysfunction following radical prostatectomy: Secondary | ICD-10-CM | POA: Diagnosis not present

## 2021-09-09 DIAGNOSIS — N3020 Other chronic cystitis without hematuria: Secondary | ICD-10-CM | POA: Diagnosis not present

## 2021-09-12 NOTE — Telephone Encounter (Signed)
Thank you! He would really benefit.

## 2021-09-12 NOTE — Telephone Encounter (Signed)
Spoke to patient and explained patient asssitance program for [facility] patients and will mail consent to patient to return to me and be in touch regarding status

## 2021-09-20 ENCOUNTER — Encounter: Payer: Self-pay | Admitting: Family Medicine

## 2021-09-20 ENCOUNTER — Ambulatory Visit (INDEPENDENT_AMBULATORY_CARE_PROVIDER_SITE_OTHER): Payer: Medicare HMO | Admitting: Family Medicine

## 2021-09-20 VITALS — BP 126/80 | HR 74 | Temp 97.6°F | Ht 71.0 in | Wt 261.4 lb

## 2021-09-20 DIAGNOSIS — E669 Obesity, unspecified: Secondary | ICD-10-CM | POA: Diagnosis not present

## 2021-09-20 DIAGNOSIS — B353 Tinea pedis: Secondary | ICD-10-CM | POA: Diagnosis not present

## 2021-09-20 DIAGNOSIS — I10 Essential (primary) hypertension: Secondary | ICD-10-CM

## 2021-09-20 MED ORDER — CLOTRIMAZOLE 1 % EX CREA: 1.0000 | TOPICAL_CREAM | Freq: Two times a day (BID) | CUTANEOUS | 0 refills | Status: DC

## 2021-09-20 NOTE — Progress Notes (Signed)
[facility] [address] [place] [address] Dept: [phone] Dept Fax: [phone]  Chronic Care Office Visit  Subjective:    Patient ID: Jordan Hines, male    DOB: 1950-12-02, 71 y.o..   MRN: [id number]  Chief Complaint  Patient presents with   Follow-up    3 month f/u. No concerns.      History of Present Illness:  Patient is in today for reassessment of chronic medical issues.  Jordan Hines has a history of hypertension. He is managed on lisinopril 10 mg daily and metoprolol 25 mg daily. Jordan Hines is taking GOLO and feels this has contributed to some weight loss for him. He feels it does help to curb his appetite. He admits that he doesn't get as much exercise as he should. He has a Community education officer" card which would gain him free access to the [facility]. However, he notes that at times, he doesn't feel like driving there. he enjoys watching TV (sports, cooking shows, YouTube) and notes this consumes much of his day.  I treated Jordan Hines for tinea pedis last month. he notes this cleared up well. he would like to have another fill of the medicine to keep on hand should this recur.   Past Medical History: Patient Active Problem List   Diagnosis Date Noted   Nasal polyps 08/14/2021   Erectile dysfunction 03/22/2021   History of prostate cancer 03/22/2021   Tobacco use disorder, continuous 03/22/2021   Essential hypertension 12/19/2020   Obesity (BMI 35.0-39.9 without comorbidity) 12/19/2020   Acute gouty arthritis 02/26/2018   Non-allergic rhinitis 01/06/2018   Past Surgical History:  Procedure Laterality Date   COLONOSCOPY  2000   Dr.Orr "normal" per pt-no report    INGUINAL HERNIA REPAIR Right    NASAL SEPTOPLASTY W/ TURBINOPLASTY Bilateral 10/19/2018   Procedure: NASAL SEPTOPLASTY WITH  BILATERAL TURBINATE REDUCTION;  Surgeon: [name], MD;  Location: [place];  Service: ENT;  Laterality:  Bilateral;   PROSTATE SURGERY     Family History  Problem Relation Age of Onset   Kidney disease Mother    Heart disease Mother    Asthma Mother    Cancer Father        Non-Hodgkins lymphoma   Diabetes Father    Cancer Sister        Bladder   Colon polyps Brother    Heart disease Maternal Grandmother    Heart disease Paternal Grandmother    Diabetes Niece    Diabetes Nephew    Allergic rhinitis Neg Hx    Eczema Neg Hx    Urticaria Neg Hx    Colon cancer Neg Hx    Esophageal cancer Neg Hx    Rectal cancer Neg Hx    Stomach cancer Neg Hx    Outpatient Medications Prior to Visit  Medication Sig Dispense Refill   allopurinol (ZYLOPRIM) 100 MG tablet TAKE 1 TABLET EVERY DAY 90 tablet 2   azelastine (ASTELIN) 0.1 % nasal spray Place 1 spray into both nostrils 2 (two) times daily. Use in each nostril as directed 30 mL 12   cyanocobalamin 1000 MCG tablet Take 1,000 mcg by mouth daily.     Fluticasone Propionate (XHANCE) 93 MCG/ACT EXHU Place 2 sprays into the nose 2 (two) times daily. 16 mL 5   ipratropium (ATROVENT) 0.06 % nasal spray USE 1 SPRAY IN EACH NOSTRIL THREE TIMES DAILY 60 mL 1   lisinopril (ZESTRIL) 10 MG  tablet TAKE 1 TABLET EVERY DAY 90 tablet 1   metoprolol succinate (TOPROL-XL) 25 MG 24 hr tablet TAKE 1 TABLET EVERY DAY 90 tablet 1   Multiple Vitamin (MULTIVITAMIN WITH MINERALS) TABS tablet Take 1 tablet by mouth daily.     neomycin-polymyxin-hydrocortisone (CORTISPORIN) OTIC solution Place 3 drops into the right ear 4 (four) times daily. 10 mL 0   sertraline (ZOLOFT) 50 MG tablet TAKE 1 TABLET EVERY DAY 90 tablet 2   sildenafil (VIAGRA) 100 MG tablet Take 100 mg by mouth daily.     UNABLE TO FIND Take by mouth daily. Golo- wieght loss supplement     clotrimazole (CLOTRIMAZOLE ANTI-FUNGAL) 1 % cream Apply 1 Application topically 2 (two) times daily. 30 g 0   Facility-Administered Medications Prior to Visit  Medication Dose Route Frequency Provider Last Rate Last  Admin   dupilumab (DUPIXENT) prefilled syringe 300 mg  300 mg Subcutaneous Q14 Days Clemon Chambers, MD   300 mg at 08/14/21 1534   No Known Allergies    Objective:   Today's Vitals   09/20/21 1421  BP: 126/80  Pulse: 74  Temp: 97.6 F (36.4 C)  TempSrc: Temporal  SpO2: 95%  Weight: 261 lb 6.4 oz (118.6 kg)  Height: '5\' 11"'$  (1.803 m)   Body mass index is 36.46 kg/m.   General: Well developed, well nourished. No acute distress. Psych: Alert and oriented. Normal mood and affect.  Health Maintenance Due  Topic Date Due   Zoster Vaccines- Shingrix (1 of 2) Never done   TETANUS/TDAP  08/22/2018   INFLUENZA VACCINE  09/10/2021     Assessment & Plan:   1. Essential hypertension Blood pressure is at goal today. We will continue lisinopril 10 mg daily and metoprolol 25 mg daily. He will be due for fasting labs at his next visit.  2. Obesity (BMI 35.0-39.9 without comorbidity) Weight is down 9 lbs since May. I encouraged Jordan Hines to start including some regular exercise in his daily routine. We discussed trading a 30-min TV show for 30 minutes of exercise. I encouraged him to do walking in [place]  3. Tinea pedis of right foot I will renew this for him to keep on hand.  - clotrimazole (CLOTRIMAZOLE ANTI-FUNGAL) 1 % cream; Apply 1 Application topically 2 (two) times daily.  Dispense: 30 g; Refill: 0   Return in about 3 months (around 12/21/2021) for Reassessment; fasting labs.   Haydee Salter, MD

## 2021-10-10 ENCOUNTER — Ambulatory Visit (INDEPENDENT_AMBULATORY_CARE_PROVIDER_SITE_OTHER): Payer: Medicare HMO

## 2021-10-10 DIAGNOSIS — J339 Nasal polyp, unspecified: Secondary | ICD-10-CM | POA: Diagnosis not present

## 2021-10-10 NOTE — Progress Notes (Signed)
Immunotherapy   Patient Details  Name: BURR SOFFER MRN: [id number] Date of Birth: [birth year]  10/10/2021  Porfirio Mylar started injections for  Dupixent Following schedule: Every fourteen days.  Frequency: Every two weeks. Epi-Pen: Not needed.  Consent will be signed and patient instructions given. Patient waited in his car for thirty minutes without an issue.    Julius Bowels 10/10/2021, 4:05 PM

## 2021-10-11 ENCOUNTER — Ambulatory Visit: Payer: Medicare HMO

## 2021-10-24 ENCOUNTER — Ambulatory Visit (INDEPENDENT_AMBULATORY_CARE_PROVIDER_SITE_OTHER): Payer: Medicare HMO

## 2021-10-24 DIAGNOSIS — J339 Nasal polyp, unspecified: Secondary | ICD-10-CM | POA: Diagnosis not present

## 2021-11-05 ENCOUNTER — Ambulatory Visit: Payer: Medicare HMO

## 2021-11-07 ENCOUNTER — Ambulatory Visit (INDEPENDENT_AMBULATORY_CARE_PROVIDER_SITE_OTHER): Payer: Medicare HMO

## 2021-11-07 DIAGNOSIS — J339 Nasal polyp, unspecified: Secondary | ICD-10-CM

## 2021-11-12 NOTE — Progress Notes (Unsigned)
FOLLOW UP Date of Service/Encounter:  11/13/21   Subjective:  Jordan Hines (DOB: January 20, 1951) is a 71 y.o. male who returns to the [facility] on 11/13/2021 in re-evaluation of the following: Chronic rhinosinusitis with nasal polyposis History obtained from: chart review and patient.  For Review, LV was on 08/14/2021 with Dr.Alaysia Lightle seen for intial visit for .  Chronic rhinosinusitis with nasal polyposis .  We started him on Dupixent. -------------------- History/diagnostics: - Chronic rhinosinusitis with nasal polyposis-followed by Dr.Teoh with previous sinus surgery, year-round stuffy and runny nose not controlled on daily Flonase and ipratropium.  Using Afrin once a week.  Hyposmia.  Recurrent ear infections. 2019 environmental allergy testing was negative.  Dupixent started 08/14/2021 ----------------------------- Today presents for follow-up. He reports feeling clinically improved since starting Dupixent.  He can definitely breathe better and is less congested.  He also has increased sense of smell. He does however continue to complain of occasional rhinorrhea which does not happen every day but when it does happen it is an inconvenience to him. He doesn't notice any rhyme or reason to when his nose is running.  He well associates sneezing as well.  He is using Atrovent around once a day.  He is using Xhance once a day as well. He does report pain with the home injector.  He is coming here to get his injections and is due next week. Allergies as of 11/13/2021   No Known Allergies      Medication List        Accurate as of November 13, 2021  6:54 PM. If you have any questions, ask your nurse or doctor.          allopurinol 100 MG tablet Commonly known as: ZYLOPRIM TAKE 1 TABLET EVERY DAY   azelastine 0.1 % nasal spray Commonly known as: ASTELIN Place 1 spray into both nostrils 2 (two) times daily. Use in each nostril as directed   clotrimazole 1 %  cream Commonly known as: Clotrimazole Anti-Fungal Apply 1 Application topically 2 (two) times daily.   cyanocobalamin 1000 MCG tablet Take 1,000 mcg by mouth daily.   ipratropium 0.06 % nasal spray Commonly known as: ATROVENT USE 1 SPRAY IN EACH NOSTRIL THREE TIMES DAILY   lisinopril 10 MG tablet Commonly known as: ZESTRIL TAKE 1 TABLET EVERY DAY   metoprolol succinate 25 MG 24 hr tablet Commonly known as: TOPROL-XL TAKE 1 TABLET EVERY DAY   multivitamin with minerals Tabs tablet Take 1 tablet by mouth daily.   neomycin-polymyxin-hydrocortisone OTIC solution Commonly known as: CORTISPORIN Place 3 drops into the right ear 4 (four) times daily.   sertraline 50 MG tablet Commonly known as: ZOLOFT TAKE 1 TABLET EVERY DAY   sildenafil 100 MG tablet Commonly known as: VIAGRA Take 100 mg by mouth daily.   UNABLE TO FIND Take by mouth daily. Golo- wieght loss supplement   Xhance 93 MCG/ACT Exhu Generic drug: Fluticasone Propionate Place 2 sprays into the nose 2 (two) times daily.       Past Medical History:  Diagnosis Date   Anxiety    Arthritis    Cancer ([place]) 2011   Prostate cancer  sx in 2011   Depression    Deviated septum    Hypertension    Nasal turbinate hypertrophy    Past Surgical History:  Procedure Laterality Date   COLONOSCOPY  2000   Dr.Orr "normal" per pt-no report    INGUINAL HERNIA REPAIR Right    NASAL  SEPTOPLASTY W/ TURBINOPLASTY Bilateral 10/19/2018   Procedure: NASAL SEPTOPLASTY WITH  BILATERAL TURBINATE REDUCTION;  Surgeon: [name], MD;  Location: [place];  Service: ENT;  Laterality: Bilateral;   PROSTATE SURGERY     Otherwise, there have been no changes to his past medical history, surgical history, family history, or social history.  ROS: All others negative except as noted per HPI.   Objective:  BP (!) 150/70   Pulse 78   Temp 98.4 F (36.9 C) (Temporal)   Resp 16   Wt 262 lb 3.2 oz (118.9 kg)   SpO2 96%    BMI 36.57 kg/m  Body mass index is 36.57 kg/m. Physical Exam: General Appearance:  Alert, cooperative, no distress, appears stated age  Head:  Normocephalic, without obvious abnormality, atraumatic  Eyes:  Conjunctiva clear, EOM's intact  Nose: Nares normal,  right nasal polyp visible to middle turbinate, hypertrophic turbinates, normal mucosa, and septum midline  Throat: Lips, tongue normal; teeth and gums normal, normal posterior oropharynx  Neck: Supple, symmetrical  Lungs:   clear to auscultation bilaterally, Respirations unlabored, no coughing  Heart:  regular rate and rhythm and no murmur, Appears well perfused  Extremities: No edema  Skin: Skin color, texture, turgor normal, no rashes or lesions on visualized portions of skin  Neurologic: No gross deficits    Assessment/Plan   Nonallergic rhinitis with nasal polyposis: Improved - continue Dupixent (dupilumab) injections for nasal polyps-will try switching delivery devices to see if this helps with pain-sent Tammy a message to see about switching to a syringe - continue Xhance (fluticasone) 2 sprays in each nare twice daily as needed; use in place of current fluticasone - continue atrovent (ipatropium) nasal spray - 2 sprays in each nostril up to three times daily as needed - do not use decongestants more than 3 days in a row, use infrequently to avoid rebound symptoms -Nasal lavage twice daily or as needed, discussed using distilled water only or boiled then cooled tap water  Follow-up in 3 months, sooner if needed.   It was wonderful seeing you today! Thank you for letting me participate in your care.  [name], MD  [facility]

## 2021-11-13 ENCOUNTER — Ambulatory Visit: Payer: Medicare HMO | Admitting: Internal Medicine

## 2021-11-13 ENCOUNTER — Encounter: Payer: Self-pay | Admitting: Internal Medicine

## 2021-11-13 VITALS — BP 150/70 | HR 78 | Temp 98.4°F | Resp 16 | Wt 262.2 lb

## 2021-11-13 DIAGNOSIS — J339 Nasal polyp, unspecified: Secondary | ICD-10-CM

## 2021-11-13 DIAGNOSIS — J310 Chronic rhinitis: Secondary | ICD-10-CM

## 2021-11-13 MED ORDER — XHANCE 93 MCG/ACT NA EXHU: 2.0000 | INHALANT_SUSPENSION | Freq: Two times a day (BID) | NASAL | 5 refills | Status: DC

## 2021-11-13 NOTE — Patient Instructions (Addendum)
Nonallergic rhinitis with nasal polyposis:  - continue Dupixent (dupilumab) injections for nasal polyps-will try switching delivery devices to see if this helps with pain - continue Xhance (fluticasone) 2 sprays in each nare twice daily as needed; use in place of current fluticasone - continue atrovent (ipatropium) nasal spray - 2 sprays in each nostril up to three times daily as needed - do not use decongestants more than 3 days in a row, use infrequently to avoid rebound symptoms -Nasal lavage twice daily or as needed, discussed using distilled water only or boiled then cooled tap water  Follow-up in 3 months, sooner if needed.   It was wonderful seeing you today! Thank you for letting me participate in your care.   [name], MD [facility]

## 2021-11-18 ENCOUNTER — Telehealth: Payer: Self-pay | Admitting: *Deleted

## 2021-11-18 MED ORDER — DUPIXENT 300 MG/2ML ~~LOC~~ SOSY: 300.0000 mg | PREFILLED_SYRINGE | SUBCUTANEOUS | 11 refills | Status: DC

## 2021-11-18 NOTE — Telephone Encounter (Signed)
Called and advised patient of change from autoinjector to prefilled syringe hopefully will come with next shipment coming up if they get Rx in time

## 2021-11-18 NOTE — Telephone Encounter (Signed)
Thank you :)

## 2021-11-18 NOTE — Telephone Encounter (Signed)
-----   Message from Clemon Chambers, MD sent at 11/13/2021  6:52 PM EDT ----- Hi Jordan Hines-he is reporting pain with self-injector. Can we switch to syringe? He is getting shots here anyway. He brings his injector each week. Thanks.

## 2021-11-19 ENCOUNTER — Ambulatory Visit (INDEPENDENT_AMBULATORY_CARE_PROVIDER_SITE_OTHER): Payer: Medicare HMO | Admitting: *Deleted

## 2021-11-19 DIAGNOSIS — J339 Nasal polyp, unspecified: Secondary | ICD-10-CM | POA: Diagnosis not present

## 2021-12-04 ENCOUNTER — Ambulatory Visit (INDEPENDENT_AMBULATORY_CARE_PROVIDER_SITE_OTHER): Payer: Medicare HMO | Admitting: *Deleted

## 2021-12-04 DIAGNOSIS — J339 Nasal polyp, unspecified: Secondary | ICD-10-CM | POA: Diagnosis not present

## 2021-12-19 ENCOUNTER — Ambulatory Visit (INDEPENDENT_AMBULATORY_CARE_PROVIDER_SITE_OTHER): Payer: Medicare HMO

## 2021-12-19 DIAGNOSIS — J339 Nasal polyp, unspecified: Secondary | ICD-10-CM | POA: Diagnosis not present

## 2021-12-20 ENCOUNTER — Encounter: Payer: Self-pay | Admitting: Family Medicine

## 2021-12-20 ENCOUNTER — Ambulatory Visit (INDEPENDENT_AMBULATORY_CARE_PROVIDER_SITE_OTHER): Payer: Medicare HMO | Admitting: Family Medicine

## 2021-12-20 VITALS — BP 136/85 | HR 84 | Temp 97.0°F | Resp 17 | Wt 266.4 lb

## 2021-12-20 DIAGNOSIS — H60501 Unspecified acute noninfective otitis externa, right ear: Secondary | ICD-10-CM | POA: Diagnosis not present

## 2021-12-20 DIAGNOSIS — E669 Obesity, unspecified: Secondary | ICD-10-CM

## 2021-12-20 DIAGNOSIS — Z8546 Personal history of malignant neoplasm of prostate: Secondary | ICD-10-CM

## 2021-12-20 DIAGNOSIS — I10 Essential (primary) hypertension: Secondary | ICD-10-CM | POA: Diagnosis not present

## 2021-12-20 LAB — BASIC METABOLIC PANEL
BUN: 17 mg/dL (ref 6–23)
CO2: 28 mEq/L (ref 19–32)
Calcium: 8.8 mg/dL (ref 8.4–10.5)
Chloride: 102 mEq/L (ref 96–112)
Creatinine, Ser: 1.07 mg/dL (ref 0.40–1.50)
GFR: 69.11 mL/min (ref >60.00)
Glucose, Bld: 146 mg/dL — ABNORMAL HIGH (ref 70–99)
Potassium: 4 mEq/L (ref 3.5–5.1)
Sodium: 136 mEq/L (ref 135–145)

## 2021-12-20 MED ORDER — NEOMYCIN-POLYMYXIN-HC 3.5-10000-1 OT SOLN: 3.0000 [drp] | Freq: Four times a day (QID) | OTIC | 0 refills | Status: DC

## 2021-12-20 NOTE — Progress Notes (Signed)
[facility] [address] [place] [address] Dept: [phone] Dept Fax: [phone]  Chronic Care Office Visit  Subjective:    Patient ID: Jordan Hines, male    DOB: 1950-03-22, 71 y.o..   MRN: [id number]  Chief Complaint  Patient presents with   Follow-up    Fasting labs, reassessment, pt is fasting pt has been experiencing ear pain in right ear.     History of Present Illness:  Patient is in today for reassessment of chronic medical issues.  Jordan Hines has a history of hypertension. He is managed on lisinopril 10 mg daily and metoprolol 25 mg daily. He admits that he has been drinking alcohol a bit more frequently in the past few months, esp. while watching football on TV. He wonders if this may have impacted his BP.  Jordan Hines has a history of prostate cancer. He notes that he is following with urology and his PSA trest has remained low.  Jordan Hines has noted recent issues with pain in his right ear. He states that he seems to periodically get an ear infection on that side. he wonders if this is an issue related to gettign shampoo in that ear.  Past Medical History: Patient Active Problem List   Diagnosis Date Noted   Nasal polyps 08/14/2021   Erectile dysfunction 03/22/2021   History of prostate cancer 03/22/2021   Tobacco use disorder, continuous 03/22/2021   Essential hypertension 12/19/2020   Obesity (BMI 35.0-39.9 without comorbidity) 12/19/2020   Acute gouty arthritis 02/26/2018   Non-allergic rhinitis 01/06/2018   Past Surgical History:  Procedure Laterality Date   COLONOSCOPY  2000   Dr.Orr "normal" per pt-no report    INGUINAL HERNIA REPAIR Right    NASAL SEPTOPLASTY W/ TURBINOPLASTY Bilateral 10/19/2018   Procedure: NASAL SEPTOPLASTY WITH  BILATERAL TURBINATE REDUCTION;  Surgeon: [name], MD;  Location: [place];  Service: ENT;  Laterality: Bilateral;   PROSTATE SURGERY      Family History  Problem Relation Age of Onset   Kidney disease Mother    Heart disease Mother    Asthma Mother    Cancer Father        Non-Hodgkins lymphoma   Diabetes Father    Cancer Sister        Bladder   Colon polyps Brother    Heart disease Maternal Grandmother    Heart disease Paternal Grandmother    Diabetes Niece    Diabetes Nephew    Allergic rhinitis Neg Hx    Eczema Neg Hx    Urticaria Neg Hx    Colon cancer Neg Hx    Esophageal cancer Neg Hx    Rectal cancer Neg Hx    Stomach cancer Neg Hx     Outpatient Medications Prior to Visit  Medication Sig Dispense Refill   allopurinol (ZYLOPRIM) 100 MG tablet TAKE 1 TABLET EVERY DAY 90 tablet 2   azelastine (ASTELIN) 0.1 % nasal spray Place 1 spray into both nostrils 2 (two) times daily. Use in each nostril as directed 30 mL 12   clotrimazole (CLOTRIMAZOLE ANTI-FUNGAL) 1 % cream Apply 1 Application topically 2 (two) times daily. 30 g 0   cyanocobalamin 1000 MCG tablet Take 1,000 mcg by mouth daily.     dupilumab (DUPIXENT) 300 MG/2ML prefilled syringe Inject 300 mg into the skin every 28 (twenty-eight) days. 4 mL 11   Fluticasone Propionate (XHANCE) 93 MCG/ACT EXHU Place 2 sprays into the nose  2 (two) times daily. 16 mL 5   ipratropium (ATROVENT) 0.06 % nasal spray USE 1 SPRAY IN EACH NOSTRIL THREE TIMES DAILY 60 mL 1   lisinopril (ZESTRIL) 10 MG tablet TAKE 1 TABLET EVERY DAY 90 tablet 1   metoprolol succinate (TOPROL-XL) 25 MG 24 hr tablet TAKE 1 TABLET EVERY DAY 90 tablet 1   Multiple Vitamin (MULTIVITAMIN WITH MINERALS) TABS tablet Take 1 tablet by mouth daily.     neomycin-polymyxin-hydrocortisone (CORTISPORIN) OTIC solution Place 3 drops into the right ear 4 (four) times daily. 10 mL 0   sertraline (ZOLOFT) 50 MG tablet TAKE 1 TABLET EVERY DAY 90 tablet 2   sildenafil (VIAGRA) 100 MG tablet Take 100 mg by mouth daily.     UNABLE TO FIND Take by mouth daily. Golo- wieght loss supplement     Facility-Administered  Medications Prior to Visit  Medication Dose Route Frequency Provider Last Rate Last Admin   dupilumab (DUPIXENT) prefilled syringe 300 mg  300 mg Subcutaneous Q14 Days Clemon Chambers, MD   300 mg at 12/19/21 1601   No Known Allergies    Objective:   Today's Vitals   12/20/21 0923  BP: 136/85  Pulse: 84  Resp: 17  Temp: (!) 97 F (36.1 C)  TempSrc: Temporal  SpO2: 92%  Weight: 266 lb 6.4 oz (120.8 kg)   Body mass index is 37.16 kg/m.   General: Well developed, well nourished. No acute distress. HEENT: Normocephalic, non-traumatic. External ears normal. Right EAC is swollen and has   some mild drainage. The TM appears intact. Psych: Alert and oriented. Normal mood and affect.  Health Maintenance Due  Topic Date Due   Zoster Vaccines- Shingrix (1 of 2) Never done   TETANUS/TDAP  08/22/2018   COVID-19 Vaccine (4 - Pfizer series) 01/20/2020   INFLUENZA VACCINE  09/10/2021     Assessment & Plan:   1. Essential hypertension Jordan Hines blood pressure is mildly high today. We discussed that increased alcohol intake could be contributing to this. He commits to reducing alcohol. I asked that he monitor his BP at home over the next 2 weeks and then send me his log through My Chart. Otherwise I will continue him on lisinopril 10 mg daily and metoprolol 25 mg daily.  - Basic metabolic panel  2. Obesity (BMI 35.0-39.9 without comorbidity) Weight is overalls table. Encourage him to get some daily exercise.  3. History of prostate cancer Follows with urology.  4. Acute otitis externa of right ear, unspecified type Exam is consistent with an external otitis. I will prescribe some Cortisporin for this.  - neomycin-polymyxin-hydrocortisone (CORTISPORIN) OTIC solution; Place 3 drops into the right ear 4 (four) times daily.  Dispense: 10 mL; Refill: 0   Return in about 3 months (around 03/22/2022) for Reassessment.   Haydee Salter, MD

## 2021-12-25 ENCOUNTER — Other Ambulatory Visit: Payer: Self-pay | Admitting: Family Medicine

## 2021-12-25 DIAGNOSIS — I10 Essential (primary) hypertension: Secondary | ICD-10-CM

## 2022-01-05 ENCOUNTER — Other Ambulatory Visit: Payer: Self-pay | Admitting: Family Medicine

## 2022-01-05 DIAGNOSIS — F32A Depression, unspecified: Secondary | ICD-10-CM

## 2022-01-05 DIAGNOSIS — M109 Gout, unspecified: Secondary | ICD-10-CM

## 2022-01-08 ENCOUNTER — Ambulatory Visit: Payer: Medicare HMO

## 2022-01-08 DIAGNOSIS — J339 Nasal polyp, unspecified: Secondary | ICD-10-CM

## 2022-01-08 NOTE — Progress Notes (Signed)
Immunotherapy   Patient Details  Name: Jordan Hines MRN: [id number] Date of Birth: 02/29/1950  01/08/2022  Jordan Hines here to learn how to self administer Dupixent. Patient was shown how to self administer Dupixent and demonstrated correct administration. Patient will continue self administration at home.   Frequency: every 14 days Epi-Pen:Epi-Pen Available  Consent signed and patient instructions given.   Herbie Drape 01/08/2022, 11:02 AM

## 2022-01-12 ENCOUNTER — Other Ambulatory Visit: Payer: Self-pay | Admitting: Family Medicine

## 2022-01-12 DIAGNOSIS — J310 Chronic rhinitis: Secondary | ICD-10-CM

## 2022-02-17 ENCOUNTER — Ambulatory Visit (INDEPENDENT_AMBULATORY_CARE_PROVIDER_SITE_OTHER): Payer: Medicare HMO | Admitting: Family Medicine

## 2022-02-17 ENCOUNTER — Encounter: Payer: Self-pay | Admitting: Family Medicine

## 2022-02-17 VITALS — BP 134/84 | HR 86 | Temp 97.2°F | Ht 71.0 in | Wt 264.0 lb

## 2022-02-17 DIAGNOSIS — I10 Essential (primary) hypertension: Secondary | ICD-10-CM | POA: Diagnosis not present

## 2022-02-17 DIAGNOSIS — R739 Hyperglycemia, unspecified: Secondary | ICD-10-CM | POA: Diagnosis not present

## 2022-02-17 LAB — HEMOGLOBIN A1C: Hgb A1c MFr Bld: 6.1 % (ref 4.6–6.5)

## 2022-02-17 LAB — GLUCOSE, RANDOM: Glucose, Bld: 90 mg/dL (ref 70–99)

## 2022-02-17 NOTE — Progress Notes (Signed)
[facility] [address] [place] [address] Dept: [phone] Dept Fax: [phone]  Chronic Care Office Visit  Subjective:    Patient ID: Jordan Hines, male    DOB: Sep 18, 1950, 72 y.o..   MRN: [id number]  Chief Complaint  Patient presents with   Follow-up    F/u BP.     History of Present Illness:  Patient is in today for reassessment of chronic medical issues.  Jordan Hines has a history of hypertension. He is managed on lisinopril 10 mg daily and metoprolol 25 mg daily.  He brought in a log of his home pressures. He has been using a wrist BP unit. His average blood pressure over the past 7 stored (the date was incorrectly set) was 148/101. I had him use the unit today. He got a pressure of 138/99 in office.  Jordan Hines has a history of hyperglycemia. His A1cs are typically in a prediabetes rang.e He did have a single elevated A1c of 6.9% in 05/2019.  Past Medical History: Patient Active Problem List   Diagnosis Date Noted   Nasal polyps 08/14/2021   Erectile dysfunction 03/22/2021   History of prostate cancer 03/22/2021   Tobacco use disorder, continuous 03/22/2021   Essential hypertension 12/19/2020   Obesity (BMI 35.0-39.9 without comorbidity) 12/19/2020   Acute gouty arthritis 02/26/2018   Non-allergic rhinitis 01/06/2018   Past Surgical History:  Procedure Laterality Date   COLONOSCOPY  2000   Dr.Orr "normal" per pt-no report    INGUINAL HERNIA REPAIR Right    NASAL SEPTOPLASTY W/ TURBINOPLASTY Bilateral 10/19/2018   Procedure: NASAL SEPTOPLASTY WITH  BILATERAL TURBINATE REDUCTION;  Surgeon: [name], MD;  Location: [facility];  Service: ENT;  Laterality: Bilateral;   PROSTATE SURGERY     Family History  Problem Relation Age of Onset   Kidney disease Mother    Heart disease Mother    Asthma Mother    Cancer Father        Non-Hodgkins lymphoma   Diabetes Father    Cancer Sister         Bladder   Colon polyps Brother    Heart disease Maternal Grandmother    Heart disease Paternal Grandmother    Diabetes Niece    Diabetes Nephew    Allergic rhinitis Neg Hx    Eczema Neg Hx    Urticaria Neg Hx    Colon cancer Neg Hx    Esophageal cancer Neg Hx    Rectal cancer Neg Hx    Stomach cancer Neg Hx    Outpatient Medications Prior to Visit  Medication Sig Dispense Refill   allopurinol (ZYLOPRIM) 100 MG tablet TAKE 1 TABLET EVERY DAY 90 tablet 2   azelastine (ASTELIN) 0.1 % nasal spray Place 1 spray into both nostrils 2 (two) times daily. Use in each nostril as directed 30 mL 12   clotrimazole (CLOTRIMAZOLE ANTI-FUNGAL) 1 % cream Apply 1 Application topically 2 (two) times daily. 30 g 0   cyanocobalamin 1000 MCG tablet Take 1,000 mcg by mouth daily.     dupilumab (DUPIXENT) 300 MG/2ML prefilled syringe Inject 300 mg into the skin every 28 (twenty-eight) days. 4 mL 11   Fluticasone Propionate (XHANCE) 93 MCG/ACT EXHU Place 2 sprays into the nose 2 (two) times daily. 16 mL 5   ipratropium (ATROVENT) 0.06 % nasal spray USE 1 SPRAY IN EACH NOSTRIL THREE TIMES DAILY 60 mL 3   lisinopril (ZESTRIL) 10 MG tablet TAKE  1 TABLET EVERY DAY 90 tablet 1   metoprolol succinate (TOPROL-XL) 25 MG 24 hr tablet TAKE 1 TABLET EVERY DAY 90 tablet 1   Multiple Vitamin (MULTIVITAMIN WITH MINERALS) TABS tablet Take 1 tablet by mouth daily.     sertraline (ZOLOFT) 50 MG tablet TAKE 1 TABLET EVERY DAY 90 tablet 2   sildenafil (VIAGRA) 100 MG tablet Take 100 mg by mouth daily.     UNABLE TO FIND Take by mouth daily. Golo- wieght loss supplement     neomycin-polymyxin-hydrocortisone (CORTISPORIN) OTIC solution Place 3 drops into the right ear 4 (four) times daily. 10 mL 0   neomycin-polymyxin-hydrocortisone (CORTISPORIN) OTIC solution Place 3 drops into the right ear 4 (four) times daily. 10 mL 0   Facility-Administered Medications Prior to Visit  Medication Dose Route Frequency Provider Last Rate  Last Admin   dupilumab (DUPIXENT) prefilled syringe 300 mg  300 mg Subcutaneous Q14 Days Clemon Chambers, MD   300 mg at 12/19/21 1601   No Known Allergies    Objective:   Today's Vitals   02/17/22 1324  BP: 134/84  Pulse: 86  Temp: (!) 97.2 F (36.2 C)  TempSrc: Temporal  SpO2: 97%  Weight: 264 lb (119.7 kg)  Height: '5\' 11"'$  (1.803 m)   Body mass index is 36.82 kg/m.   General: Well developed, well nourished. No acute distress. Psych: Alert and oriented. Normal mood and affect.  Health Maintenance Due  Topic Date Due   Zoster Vaccines- Shingrix (1 of 2) Never done   DTaP/Tdap/Td (2 - Td or Tdap) 08/22/2018     Assessment & Plan:   1. Essential hypertension It does appear that the home wrist BP cuff is reading higher than what we are seeing in the clinic, esp. with the diastolic blood pressure. We discussed him getting a different home BP unit that measures the pressure int he upper arm (I recommended [employer] unit). I will continue his lisinopril 10 mg dialy and metoprolol succinate 25 mg daily for now.  2. Hyperglycemia I will reassess his fasting glucose and A1c today.  - Glucose, random - Hemoglobin A1c   Return in about 3 months (around 05/19/2022) for Reassessment.   Haydee Salter, MD

## 2022-02-18 NOTE — Progress Notes (Deleted)
FOLLOW UP Date of Service/Encounter:  02/18/22   Subjective:  Jordan Hines (DOB: [birth year]) is a 72 y.o. male who returns to the [facility] on 02/19/2022 in re-evaluation of the following: *** History obtained from: chart review and {Persons; PED relatives w/patient:19415::"patient"}.  For Review, LV was on 11/13/21  with Dr.Monifah Freehling seen for routine follow-up. Clinically improved on dupixent.  Enhanced sense of smell. Still having vasomotor rhinitis mostly rhinorrhea without obvious trigger. Using atrovent and xhance with some relief.   Previous History/diagnostics: Chronic rhinosinusitis with nasal polyposis- followed by Dr.Teoh with previous sinus surgery, year-round stuffy and runny nose not controlled on daily Flonase and ipratropium.  Using Afrin once a week.  Hyposmia.  Recurrent ear infections. 2019 environmental allergy testing was negative.   Dupixent started 08/14/2021  Today presents for follow-up. ***  Allergies as of 02/19/2022   No Known Allergies      Medication List        Accurate as of February 18, 2022 11:07 AM. If you have any questions, ask your nurse or doctor.          allopurinol 100 MG tablet Commonly known as: ZYLOPRIM TAKE 1 TABLET EVERY DAY   azelastine 0.1 % nasal spray Commonly known as: ASTELIN Place 1 spray into both nostrils 2 (two) times daily. Use in each nostril as directed   clotrimazole 1 % cream Commonly known as: Clotrimazole Anti-Fungal Apply 1 Application topically 2 (two) times daily.   cyanocobalamin 1000 MCG tablet Take 1,000 mcg by mouth daily.   Dupixent 300 MG/2ML prefilled syringe Generic drug: dupilumab Inject 300 mg into the skin every 28 (twenty-eight) days.   ipratropium 0.06 % nasal spray Commonly known as: ATROVENT USE 1 SPRAY IN EACH NOSTRIL THREE TIMES DAILY   lisinopril 10 MG tablet Commonly known as: ZESTRIL TAKE 1 TABLET EVERY DAY   metoprolol succinate 25 MG 24 hr  tablet Commonly known as: TOPROL-XL TAKE 1 TABLET EVERY DAY   multivitamin with minerals Tabs tablet Take 1 tablet by mouth daily.   sertraline 50 MG tablet Commonly known as: ZOLOFT TAKE 1 TABLET EVERY DAY   sildenafil 100 MG tablet Commonly known as: VIAGRA Take 100 mg by mouth daily.   UNABLE TO FIND Take by mouth daily. Golo- wieght loss supplement   Xhance 93 MCG/ACT Exhu Generic drug: Fluticasone Propionate Place 2 sprays into the nose 2 (two) times daily.       Past Medical History:  Diagnosis Date   Anxiety    Arthritis    Cancer ([place]) 2011   Prostate cancer  sx in 2011   Depression    Deviated septum    Hypertension    Nasal turbinate hypertrophy    Past Surgical History:  Procedure Laterality Date   COLONOSCOPY  2000   Dr.Orr "normal" per pt-no report    INGUINAL HERNIA REPAIR Right    NASAL SEPTOPLASTY W/ TURBINOPLASTY Bilateral 10/19/2018   Procedure: NASAL SEPTOPLASTY WITH  BILATERAL TURBINATE REDUCTION;  Surgeon: [name], MD;  Location: [facility];  Service: ENT;  Laterality: Bilateral;   PROSTATE SURGERY     Otherwise, there have been no changes to his past medical history, surgical history, family history, or social history.  ROS: All others negative except as noted per HPI.   Objective:  There were no vitals taken for this visit. There is no height or weight on file to calculate BMI. Physical Exam: General Appearance:  Alert, cooperative, no  distress, appears stated age  Head:  Normocephalic, without obvious abnormality, atraumatic  Eyes:  Conjunctiva clear, EOM's intact  Nose: Nares normal, {Blank multiple:19196:a:"***","hypertrophic turbinates","normal mucosa","no visible anterior polyps","septum midline"}  Throat: Lips, tongue normal; teeth and gums normal, {Blank multiple:19196:a:"***","normal posterior oropharynx","tonsils 2+","tonsils 3+","no tonsillar exudate","+ cobblestoning"}  Neck: Supple, symmetrical  Lungs:    {Blank multiple:19196:a:"***","clear to auscultation bilaterally","end-expiratory wheezing","wheezing throughout"}, Respirations unlabored, {Blank multiple:19196:a:"***","no coughing","intermittent dry coughing"}  Heart:  {Blank multiple:19196:a:"***","regular rate and rhythm","no murmur"}, Appears well perfused  Extremities: No edema  Skin: Skin color, texture, turgor normal, no rashes or lesions on visualized portions of skin  Neurologic: No gross deficits   Reviewed: ***  Spirometry:  Tracings reviewed. His effort: {Blank single:19197::"Good reproducible efforts.","It was hard to get consistent efforts and there is a question as to whether this reflects a maximal maneuver.","Poor effort, data can not be interpreted.","Variable effort-results affected.","decent for first attempt at spirometry."} FVC: ***L FEV1: ***L, ***% predicted FEV1/FVC ratio: ***% Interpretation: {Blank single:19197::"Spirometry consistent with mild obstructive disease","Spirometry consistent with moderate obstructive disease","Spirometry consistent with severe obstructive disease","Spirometry consistent with possible restrictive disease","Spirometry consistent with mixed obstructive and restrictive disease","Spirometry uninterpretable due to technique","Spirometry consistent with normal pattern","No overt abnormalities noted given today's efforts"}.  Please see scanned spirometry results for details.  Skin Testing: {Blank single:19197::"Select foods","Environmental allergy panel","Environmental allergy panel and select foods","Food allergy panel","None","Deferred due to recent antihistamines use","deferred due to recent reaction"}. ***Adequate positive and negative controls Results discussed with patient/family.   {Blank single:19197::"Allergy testing results were read and interpreted by myself, documented by clinical staff."," "}  Assessment/Plan   ***  [name], MD  [facility]

## 2022-02-19 ENCOUNTER — Ambulatory Visit: Payer: Medicare HMO | Admitting: Internal Medicine

## 2022-02-25 NOTE — Progress Notes (Signed)
FOLLOW UP Date of Service/Encounter:  02/26/22   Subjective:  Jordan Hines (DOB: [birth year]) is a 72 y.o. male who returns to the [facility] on 02/26/2022 in re-evaluation of the following:  nonallergic rhinitis with nasal polyposis on dupixent History obtained from: chart review and patient.   For Review, LV was on 11/13/21  with Dr.Aleasha Fregeau seen for routine follow-up. Clinically improved on dupixent.  Enhanced sense of smell. Still having vasomotor rhinitis mostly rhinorrhea without obvious trigger. Using atrovent and xhance with some relief.    Previous History/diagnostics: Chronic rhinosinusitis with nasal polyposis- followed by Dr.Teoh with previous sinus surgery around 2020, year-round stuffy and runny nose not controlled on daily Flonase and ipratropium.  Using Afrin once a week.  Hyposmia.  Recurrent ear infections. 2019 environmental allergy testing was negative.   Dupixent started 08/14/2021, now doing home dosing.   Today presents for follow-up. He still has some runny nose.   He is taking his dupixent. Home dosing. Tolerating without adverse events.  He uses atrovent a lot which helps. His decreased smell is no loner an issue. Runny nose bothers him the most.  Some days are good and some are bad. Perfumes are definitely an issue for him.  When he is not around his friend who wears perfumes, his symptoms are less prevalent and sometimes non-existent.   Allergies as of 02/26/2022   No Known Allergies      Medication List        Accurate as of February 26, 2022 12:14 PM. If you have any questions, ask your nurse or doctor.          allopurinol 100 MG tablet Commonly known as: ZYLOPRIM TAKE 1 TABLET EVERY DAY   azelastine 0.1 % nasal spray Commonly known as: ASTELIN Place 1 spray into both nostrils 2 (two) times daily. Use in each nostril as directed   clotrimazole 1 % cream Commonly known as: Clotrimazole Anti-Fungal Apply 1 Application  topically 2 (two) times daily.   cyanocobalamin 1000 MCG tablet Take 1,000 mcg by mouth daily.   Dupixent 300 MG/2ML prefilled syringe Generic drug: dupilumab Inject 300 mg into the skin every 28 (twenty-eight) days.   ipratropium 0.06 % nasal spray Commonly known as: ATROVENT USE 1 SPRAY IN EACH NOSTRIL THREE TIMES DAILY   lisinopril 10 MG tablet Commonly known as: ZESTRIL TAKE 1 TABLET EVERY DAY   metoprolol succinate 25 MG 24 hr tablet Commonly known as: TOPROL-XL TAKE 1 TABLET EVERY DAY   multivitamin with minerals Tabs tablet Take 1 tablet by mouth daily.   sertraline 50 MG tablet Commonly known as: ZOLOFT TAKE 1 TABLET EVERY DAY   sildenafil 100 MG tablet Commonly known as: VIAGRA Take 100 mg by mouth daily.   UNABLE TO FIND Take by mouth daily. Golo- wieght loss supplement   Xhance 93 MCG/ACT Exhu Generic drug: Fluticasone Propionate Place 2 sprays into the nose 2 (two) times daily.       Past Medical History:  Diagnosis Date   Anxiety    Arthritis    Cancer ([place]) 2011   Prostate cancer  sx in 2011   Depression    Deviated septum    Hypertension    Nasal turbinate hypertrophy    Past Surgical History:  Procedure Laterality Date   COLONOSCOPY  2000   Dr.Orr "normal" per pt-no report    INGUINAL HERNIA REPAIR Right    NASAL SEPTOPLASTY W/ TURBINOPLASTY Bilateral 10/19/2018   Procedure: NASAL SEPTOPLASTY  WITH  BILATERAL TURBINATE REDUCTION;  Surgeon: [name], MD;  Location: [facility];  Service: ENT;  Laterality: Bilateral;   PROSTATE SURGERY     Otherwise, there have been no changes to his past medical history, surgical history, family history, or social history.  ROS: All others negative except as noted per HPI.   Objective:  BP 130/88   Pulse 78   Temp (!) 97.5 F (36.4 C) (Temporal)   Resp 16   Ht '5\' 10"'$  (1.778 m)   Wt 265 lb 8 oz (120.4 kg)   SpO2 98%   BMI 38.10 kg/m  Body mass index is 38.1 kg/m. Physical  Exam: General Appearance:  Alert, cooperative, no distress, appears stated age  Head:  Normocephalic, without obvious abnormality, atraumatic  Eyes:  Conjunctiva clear, EOM's intact  Nose: Nares normal, hypertrophic turbinates, normal mucosa, and no visible anterior polyps  Throat: Lips, tongue normal; teeth and gums normal, normal posterior oropharynx  Neck: Supple, symmetrical  Lungs:   clear to auscultation bilaterally, Respirations unlabored, no coughing  Heart:  regular rate and rhythm and no murmur, Appears well perfused  Extremities: No edema  Skin: Skin color, texture, turgor normal, no rashes or lesions on visualized portions of skin  Neurologic: No gross deficits   Assessment/Plan   Nonallergic rhinitis (vasomotor and irritant) with nasal polyposis: controlled - continue Dupixent (dupilumab) injections for nasal polyps for now - continue Xhance (fluticasone) 2 sprays in each nare twice daily as needed; use in place of current fluticasone - continue atrovent (ipatropium) nasal spray - 2 sprays in each nostril up to three times daily as needed - do not use decongestants more than 3 days in a row, use infrequently to avoid rebound symptoms -Nasal lavage twice daily or as needed, discussed using distilled water only or boiled then cooled tap water  Follow-up in 12 months, sooner if needed.   It was wonderful seeing you today! Thank you for letting me participate in your care.  [name], MD  [facility]

## 2022-02-26 ENCOUNTER — Encounter: Payer: Self-pay | Admitting: Internal Medicine

## 2022-02-26 ENCOUNTER — Other Ambulatory Visit: Payer: Self-pay

## 2022-02-26 ENCOUNTER — Ambulatory Visit: Payer: Medicare HMO | Admitting: Internal Medicine

## 2022-02-26 VITALS — BP 130/88 | HR 78 | Temp 97.5°F | Resp 16 | Ht 70.0 in | Wt 265.5 lb

## 2022-02-26 DIAGNOSIS — J300 Vasomotor rhinitis: Secondary | ICD-10-CM | POA: Diagnosis not present

## 2022-02-26 DIAGNOSIS — J339 Nasal polyp, unspecified: Secondary | ICD-10-CM

## 2022-02-26 DIAGNOSIS — J310 Chronic rhinitis: Secondary | ICD-10-CM

## 2022-02-26 MED ORDER — IPRATROPIUM BROMIDE 0.06 % NA SOLN: NASAL | 11 refills | Status: DC

## 2022-02-26 MED ORDER — XHANCE 93 MCG/ACT NA EXHU: 2.0000 | INHALANT_SUSPENSION | Freq: Two times a day (BID) | NASAL | 11 refills | Status: DC

## 2022-02-26 NOTE — Patient Instructions (Addendum)
Nonallergic rhinitis (vasomotor and irritant) with nasal polyposis:  - continue Dupixent (dupilumab) injections for nasal polyps for now - continue Xhance (fluticasone) 2 sprays in each nare twice daily as needed; use in place of current fluticasone - continue atrovent (ipatropium) nasal spray - 2 sprays in each nostril up to three times daily as needed - do not use decongestants more than 3 days in a row, use infrequently to avoid rebound symptoms -Nasal lavage twice daily or as needed, discussed using distilled water only or boiled then cooled tap water  Follow-up in 12 months, sooner if needed.   It was wonderful seeing you today! Thank you for letting me participate in your care.   [name], MD [facility]

## 2022-03-25 ENCOUNTER — Ambulatory Visit: Payer: Medicare HMO | Admitting: Family Medicine

## 2022-05-19 ENCOUNTER — Ambulatory Visit: Payer: Medicare HMO | Admitting: Family Medicine

## 2022-06-13 ENCOUNTER — Ambulatory Visit
Admission: EM | Admit: 2022-06-13 | Discharge: 2022-06-13 | Disposition: A | Discharge status: Home / Self Care | Payer: Medicare HMO | Attending: Internal Medicine | Admitting: Internal Medicine

## 2022-06-13 DIAGNOSIS — L03312 Cellulitis of back [any part except buttock]: Secondary | ICD-10-CM | POA: Diagnosis not present

## 2022-06-13 MED ORDER — DOXYCYCLINE HYCLATE 100 MG PO CAPS: 100.0000 mg | ORAL_CAPSULE | Freq: Two times a day (BID) | ORAL | 0 refills | Status: DC

## 2022-06-13 NOTE — ED Provider Notes (Signed)
[facility]    CSN: [id number] Arrival date & time: 06/13/22  1521      History   Chief Complaint No chief complaint on file.   HPI Jordan Hines is a 72 y.o. male.   Patient presents today due to concerns of a spider bite to the left upper back.  Reports that he noticed it about approximately 10 days ago.  Denies any purulent drainage from the area or any associated fever, body aches, chills, nausea, vomiting.  Patient denies feeling or witnessing the spider bite him.     Past Medical History:  Diagnosis Date   Anxiety    Arthritis    Cancer (HCC) 2011   Prostate cancer  sx in 2011   Depression    Deviated septum    Hypertension    Nasal turbinate hypertrophy     Patient Active Problem List   Diagnosis Date Noted   Nasal polyps 08/14/2021   Erectile dysfunction 03/22/2021   History of prostate cancer 03/22/2021   Tobacco use disorder, continuous 03/22/2021   Essential hypertension 12/19/2020   Obesity (BMI 35.0-39.9 without comorbidity) 12/19/2020   Acute gouty arthritis 02/26/2018   Non-allergic rhinitis 01/06/2018    Past Surgical History:  Procedure Laterality Date   COLONOSCOPY  2000   Dr.Orr "normal" per pt-no report    INGUINAL HERNIA REPAIR Right    NASAL SEPTOPLASTY W/ TURBINOPLASTY Bilateral 10/19/2018   Procedure: NASAL SEPTOPLASTY WITH  BILATERAL TURBINATE REDUCTION;  Surgeon: Newman Pies, MD;  Location: [facility];  Service: ENT;  Laterality: Bilateral;   PROSTATE SURGERY         Home Medications    Prior to Admission medications   Medication Sig Start Date End Date Taking? Authorizing Provider  doxycycline (VIBRAMYCIN) 100 MG capsule Take 1 capsule (100 mg total) by mouth 2 (two) times daily. 06/13/22  Yes Cordero Surette, Rolly Salter E, FNP  allopurinol (ZYLOPRIM) 100 MG tablet TAKE 1 TABLET EVERY DAY 03/18/21   Shade Flood, MD  azelastine (ASTELIN) 0.1 % nasal spray Place 1 spray into both nostrils 2 (two) times daily. Use  in each nostril as directed 12/19/20   Loyola Mast, MD  clotrimazole (CLOTRIMAZOLE ANTI-FUNGAL) 1 % cream Apply 1 Application topically 2 (two) times daily. 09/20/21   Loyola Mast, MD  cyanocobalamin 1000 MCG tablet Take 1,000 mcg by mouth daily.    [provider]  dupilumab (DUPIXENT) 300 MG/2ML prefilled syringe Inject 300 mg into the skin every 28 (twenty-eight) days. 11/18/21   Verlee Monte, MD  Fluticasone Propionate Timmothy Sours) 93 MCG/ACT EXHU Place 2 sprays into the nose 2 (two) times daily. 02/26/22   Verlee Monte, MD  ipratropium (ATROVENT) 0.06 % nasal spray USE 1-2 SPRAYS IN [facility] NOSTRIL THREE TIMES DAILY AS NEEDED 02/26/22   Verlee Monte, MD  lisinopril (ZESTRIL) 10 MG tablet TAKE 1 TABLET EVERY DAY 03/18/21   Shade Flood, MD  metoprolol succinate (TOPROL-XL) 25 MG 24 hr tablet TAKE 1 TABLET EVERY DAY 03/18/21   Shade Flood, MD  Multiple Vitamin (MULTIVITAMIN WITH MINERALS) TABS tablet Take 1 tablet by mouth daily.    [provider]  sertraline (ZOLOFT) 50 MG tablet TAKE 1 TABLET EVERY DAY 03/18/21   Shade Flood, MD  sildenafil (VIAGRA) 100 MG tablet Take 100 mg by mouth daily. 04/19/18   [provider]  UNABLE TO FIND Take by mouth daily. Golo- wieght loss supplement    [provider]    Family History Family History  Problem Relation Age of Onset   Kidney disease Mother    Heart disease Mother    Asthma Mother    Cancer Father        Non-Hodgkins lymphoma   Diabetes Father    Cancer Sister        Bladder   Cancer Brother        Stomach   Colon polyps Brother    Heart disease Maternal Grandmother    Heart disease Paternal Grandmother    Diabetes Nephew    Diabetes Niece    Allergic rhinitis Neg Hx    Eczema Neg Hx    Urticaria Neg Hx    Colon cancer Neg Hx    Esophageal cancer Neg Hx    Rectal cancer Neg Hx    Stomach cancer Neg Hx     Social History Social History   Tobacco Use   Smoking status: Every  Day    Types: Cigars    Passive exposure: Past   Smokeless tobacco: Never   Tobacco comments:    about 2-3 small cigars daily  Vaping Use   Vaping Use: Never used  Substance Use Topics   Alcohol use: Yes    Alcohol/week: 3.0 standard drinks of alcohol    Types: 1 Cans of beer, 2 Shots of liquor per week    Comment: socially   Drug use: Yes    Comment: CBD     Allergies   Patient has no known allergies.   Review of Systems Review of Systems Per HPI  Physical Exam Triage Vital Signs ED Triage Vitals  Enc Vitals Group     BP 06/13/22 1703 136/85     Pulse Rate 06/13/22 1703 79     Resp 06/13/22 1703 18     Temp 06/13/22 1703 98.3 F (36.8 C)     Temp Source 06/13/22 1703 Oral     SpO2 06/13/22 1703 95 %     Weight --      Height --      Head Circumference --      Peak Flow --      Pain Score 06/13/22 1705 4     Pain Loc --      Pain Edu? --      Excl. in GC? --    No data found.  Updated Vital Signs BP 136/85 (BP Location: Left Arm)   Pulse 79   Temp 98.3 F (36.8 C) (Oral)   Resp 18   SpO2 95%   Visual Acuity Right Eye Distance:   Left Eye Distance:   Bilateral Distance:    Right Eye Near:   Left Eye Near:    Bilateral Near:     Physical Exam Constitutional:      General: He is not in acute distress.    Appearance: Normal appearance. He is not toxic-appearing or diaphoretic.  HENT:     Head: Normocephalic and atraumatic.  Eyes:     Extraocular Movements: Extraocular movements intact.     Conjunctiva/sclera: Conjunctivae normal.  Pulmonary:     Effort: Pulmonary effort is normal.  Skin:    Comments: Patient has approximately 2 to 3 inch in diameter indurated area of swelling that is mildly erythematous present to the left upper back.  No purulent drainage noted.  Neurological:     General: No focal deficit present.     Mental Status: He is alert and oriented to  person, place, and time. Mental status is at baseline.  Psychiatric:         Mood and Affect: Mood normal.        Behavior: Behavior normal.        Thought Content: Thought content normal.        Judgment: Judgment normal.      UC Treatments / Results  Labs (all labs ordered are listed, but only abnormal results are displayed) Labs Reviewed - No data to display  EKG   Radiology No results found.  Procedures Procedures (including critical care time)  Medications Ordered in UC Medications - No data to display  Initial Impression / Assessment and Plan / UC Course  I have reviewed the triage vital signs and the nursing notes.  Pertinent labs & imaging results that were available during my care of the patient were reviewed by me and considered in my medical decision making (see chart for details).     Differential diagnoses include abscess of back versus spider bite.  Will treat with doxycycline antibiotic given concern for infection.  Advised supportive care and symptom management.  No need for drainage at this time as it is not conducive to drainage.  Advised follow-up if any symptoms persist or worsen.  Patient verbalized understanding and was agreeable with plan. Final Clinical Impressions(s) / UC Diagnoses   Final diagnoses:  Cellulitis of back     Discharge Instructions      I have prescribed an antibiotic to treat any infection.  Recommend that you monitor for any increased redness, swelling, black discoloration, purulent drainage and follow-up sooner if this occurs.    ED Prescriptions     Medication Sig Dispense Auth. Provider   doxycycline (VIBRAMYCIN) 100 MG capsule Take 1 capsule (100 mg total) by mouth 2 (two) times daily. 20 capsule Gustavus Bryant, [place]      PDMP not reviewed this encounter.   Gustavus Bryant, [place] 06/13/22 1723

## 2022-06-13 NOTE — Discharge Instructions (Signed)
I have prescribed an antibiotic to treat any infection.  Recommend that you monitor for any increased redness, swelling, black discoloration, purulent drainage and follow-up sooner if this occurs.

## 2022-06-13 NOTE — ED Triage Notes (Addendum)
Pt reports he thinks a spider bit him on his upper back x 1 week. Pt states it is swollen, sore and has a puncture hole like it could have been a bite .

## 2022-06-16 ENCOUNTER — Ambulatory Visit (INDEPENDENT_AMBULATORY_CARE_PROVIDER_SITE_OTHER): Payer: Medicare HMO

## 2022-06-16 VITALS — Ht 72.0 in | Wt 265.0 lb

## 2022-06-16 DIAGNOSIS — Z0000 Encounter for general adult medical examination without abnormal findings: Secondary | ICD-10-CM

## 2022-06-16 NOTE — Progress Notes (Signed)
I connected with  Margaret Pyle on 06/16/22 by a audio enabled telemedicine application and verified that I am speaking with the correct person using two identifiers.  Patient Location: Home  Provider Location: Office/Clinic  I discussed the limitations of evaluation and management by telemedicine. The patient expressed understanding and agreed to proceed.  Subjective:   Jordan Hines is a 72 y.o. male who presents for Medicare Annual/Subsequent preventive examination.  Review of Systems     Cardiac Risk Factors include: advanced age (>42men, >63 women);hypertension;male gender;obesity (BMI >30kg/m2);smoking/ tobacco exposure     Objective:    Today's Vitals   06/16/22 1556  Weight: 265 lb (120.2 kg)  Height: 6' (1.829 m)   Body mass index is 35.94 kg/m.     06/16/2022    4:01 PM 06/11/2021    4:05 PM 08/28/2020    1:59 PM 06/21/2019   11:01 AM 10/19/2018    7:55 AM 10/12/2018    4:20 PM 03/27/2017   11:36 AM  Advanced Directives  Does Patient Have a Medical Advance Directive? No No No No No No No  Would patient like information on creating a medical advance directive?  No - Patient declined Yes (MAU/Ambulatory/Procedural Areas - Information given) Yes (ED - Information included in AVS) No - Patient declined No - Patient declined Yes (MAU/Ambulatory/Procedural Areas - Information given)    Current Medications (verified) Outpatient Encounter Medications as of 06/16/2022  Medication Sig   allopurinol (ZYLOPRIM) 100 MG tablet TAKE 1 TABLET EVERY DAY   azelastine (ASTELIN) 0.1 % nasal spray Place 1 spray into both nostrils 2 (two) times daily. Use in each nostril as directed   cyanocobalamin 1000 MCG tablet Take 1,000 mcg by mouth daily.   doxycycline (VIBRAMYCIN) 100 MG capsule Take 1 capsule (100 mg total) by mouth 2 (two) times daily.   dupilumab (DUPIXENT) 300 MG/2ML prefilled syringe Inject 300 mg into the skin every 28 (twenty-eight) days.   Fluticasone Propionate  (XHANCE) 93 MCG/ACT EXHU Place 2 sprays into the nose 2 (two) times daily.   ipratropium (ATROVENT) 0.06 % nasal spray USE 1-2 SPRAYS IN EACH NOSTRIL THREE TIMES DAILY AS NEEDED   lisinopril (ZESTRIL) 10 MG tablet TAKE 1 TABLET EVERY DAY   metoprolol succinate (TOPROL-XL) 25 MG 24 hr tablet TAKE 1 TABLET EVERY DAY   Multiple Vitamin (MULTIVITAMIN WITH MINERALS) TABS tablet Take 1 tablet by mouth daily.   sertraline (ZOLOFT) 50 MG tablet TAKE 1 TABLET EVERY DAY   sildenafil (VIAGRA) 100 MG tablet Take 100 mg by mouth daily.   clotrimazole (CLOTRIMAZOLE ANTI-FUNGAL) 1 % cream Apply 1 Application topically 2 (two) times daily.   UNABLE TO FIND Take by mouth daily. Golo- wieght loss supplement (Patient not taking: Reported on 06/16/2022)   Facility-Administered Encounter Medications as of 06/16/2022  Medication   dupilumab (DUPIXENT) prefilled syringe 300 mg    Allergies (verified) Patient has no known allergies.   History: Past Medical History:  Diagnosis Date   Anxiety    Arthritis    Cancer (HCC) 2011   Prostate cancer  sx in 2011   Depression    Deviated septum    Hypertension    Nasal turbinate hypertrophy    Past Surgical History:  Procedure Laterality Date   COLONOSCOPY  2000   Dr.Orr "normal" per pt-no report    INGUINAL HERNIA REPAIR Right    NASAL SEPTOPLASTY W/ TURBINOPLASTY Bilateral 10/19/2018   Procedure: NASAL SEPTOPLASTY WITH  BILATERAL TURBINATE REDUCTION;  Surgeon:  Newman Pies, MD;  Location: [facility];  Service: ENT;  Laterality: Bilateral;   PROSTATE SURGERY     Family History  Problem Relation Age of Onset   Kidney disease Mother    Heart disease Mother    Asthma Mother    Cancer Father        Non-Hodgkins lymphoma   Diabetes Father    Cancer Sister        Bladder   Cancer Brother        Stomach   Colon polyps Brother    Heart disease Maternal Grandmother    Heart disease Paternal Grandmother    Diabetes Nephew    Diabetes Niece     Allergic rhinitis Neg Hx    Eczema Neg Hx    Urticaria Neg Hx    Colon cancer Neg Hx    Esophageal cancer Neg Hx    Rectal cancer Neg Hx    Stomach cancer Neg Hx    Social History   Socioeconomic History   Marital status: Divorced    Spouse name: Not on file   Number of children: 1   Years of education: Not on file   Highest education level: Some college, no degree  Occupational History   Not on file  Tobacco Use   Smoking status: Every Day    Types: Cigars    Passive exposure: Past   Smokeless tobacco: Never   Tobacco comments:    about 2-3 small cigars daily  Vaping Use   Vaping Use: Never used  Substance and Sexual Activity   Alcohol use: Yes    Alcohol/week: 3.0 standard drinks of alcohol    Types: 1 Cans of beer, 2 Shots of liquor per week    Comment: socially   Drug use: Yes    Comment: CBD   Sexual activity: Yes  Other Topics Concern   Not on file  Social History Narrative   Not on file   Social Determinants of Health   Financial Resource Strain: Low Risk  (06/16/2022)   Overall Financial Resource Strain (CARDIA)    Difficulty of Paying Living Expenses: Not hard at all  Food Insecurity: No Food Insecurity (06/16/2022)   Hunger Vital Sign    Worried About Running Out of Food in the Last Year: Never true    Ran Out of Food in the Last Year: Never true  Transportation Needs: No Transportation Needs (06/16/2022)   PRAPARE - Administrator, Civil Service (Medical): No    Lack of Transportation (Non-Medical): No  Physical Activity: Inactive (06/16/2022)   Exercise Vital Sign    Days of Exercise per Week: 0 days    Minutes of Exercise per Session: 0 min  Stress: No Stress Concern Present (06/16/2022)   [employer] of Occupational Health - Occupational Stress Questionnaire    Feeling of Stress : Not at all  Social Connections: Moderately Integrated (06/11/2021)   Social Connection and Isolation Panel [NHANES]    Frequency of Communication with Friends  and Family: More than three times a week    Frequency of Social Gatherings with Friends and Family: Once a week    Attends Religious Services: 1 to 4 times per year    Active Member of [employer] or Organizations: Yes    Attends Engineer, structural: More than 4 times per year    Marital Status: Divorced    Tobacco Counseling Ready to quit: Not Answered Counseling given: Not Answered Tobacco comments:  about 2-3 small cigars daily   Clinical Intake:  Pre-visit preparation completed: Yes  Pain : No/denies pain     Nutritional Status: BMI > 30  Obese Nutritional Risks: None Diabetes: No  How often do you need to have someone help you when you read instructions, pamphlets, or other written materials from your doctor or pharmacy?: 1 - Never  Diabetic? no  Interpreter Needed?: No  Information entered by :: NAllen LPN   Activities of Daily Living    06/16/2022    4:02 PM 06/15/2022    4:05 PM  In your present state of health, do you have any difficulty performing the following activities:  Hearing? 0 0  Vision? 0   Difficulty concentrating or making decisions? 0   Walking or climbing stairs? 0   Dressing or bathing? 0   Doing errands, shopping? 0   Preparing Food and eating ? N   Using the Toilet? N   In the past six months, have you accidently leaked urine? Y   Do you have problems with loss of bowel control? N   Managing your Medications? N   Managing your Finances? N   Housekeeping or managing your Housekeeping? N     Patient Care Team: Loyola Mast, MD as PCP - General (Family Medicine) [place], [facility] Ellamae Sia, DO as Consulting Physician (Allergy) Newman Pies, MD as Consulting Physician (Otolaryngology) Corky Crafts, MD as Consulting Physician (Cardiology)  Indicate any recent Medical Services you may have received from other than [facility] providers in the past year (date may be approximate).     Assessment:   This is a routine  wellness examination for [place].  Hearing/Vision screen Vision Screening - Comments:: Regular eye exams, [facility]  Dietary issues and exercise activities discussed: Current Exercise Habits: The patient does not participate in regular exercise at present   Goals Addressed             This Visit's Progress    Patient Stated       06/16/2022, wants to lose weight,eating less       Depression Screen    06/16/2022    4:02 PM 02/17/2022    2:17 PM 09/20/2021    2:39 PM 06/11/2021    4:01 PM 04/09/2021    3:56 PM 03/22/2021    9:53 AM 04/24/2020    2:46 PM  PHQ 2/9 Scores  PHQ - 2 Score 0 2 1 0 0 0 0  PHQ- 9 Score  6 6  5       Fall Risk    06/16/2022    4:02 PM 06/15/2022    4:05 PM 12/20/2021    9:20 AM 06/11/2021    4:05 PM 03/22/2021    9:53 AM  Fall Risk   Falls in the past year? 0  0 0 0  Number falls in past yr: 0  0 0 0  Injury with Fall? 0 0 0 0 0  Risk for fall due to : Medication side effect   No Fall Risks No Fall Risks  Follow up Falls prevention discussed;Education provided;Falls evaluation completed   Falls prevention discussed Falls evaluation completed    FALL RISK PREVENTION PERTAINING TO THE HOME:  Any stairs in or around the home? Yes  If so, are there any without handrails? No  Home free of loose throw rugs in walkways, pet beds, electrical cords, etc? Yes  Adequate lighting in your home to reduce risk of  falls? Yes   ASSISTIVE DEVICES UTILIZED TO PREVENT FALLS:  Life alert? No  Use of a cane, walker or w/c? No  Grab bars in the bathroom? No  Shower chair or bench in shower? No  Elevated toilet seat or a handicapped toilet? No   TIMED UP AND GO:  Was the test performed? No .      Cognitive Function:        06/16/2022    4:03 PM 06/11/2021    4:09 PM 06/21/2019   11:01 AM 04/14/2018   11:16 AM 03/27/2017   11:45 AM  6CIT Screen  What Year? 0 points 0 points 0 points 0 points 0 points  What month? 0 points 0 points 0 points 0 points 0 points   What time? 0 points 0 points 0 points 0 points 0 points  Count back from 20 0 points 0 points 0 points 0 points 0 points  Months in reverse 0 points 0 points 0 points 0 points 0 points  Repeat phrase 2 points 0 points 2 points 0 points 0 points  Total Score 2 points 0 points 2 points 0 points 0 points    Immunizations Immunization History  Administered Date(s) Administered   Fluad Quad(high Dose 65+) 12/10/2018, 11/21/2019, 12/19/2020   Influenza, High Dose Seasonal PF 11/18/2017   Influenza,inj,Quad PF,6+ Mos 12/11/2014, 03/27/2017   Influenza-Unspecified 11/16/2017, 12/12/2021   PFIZER(Purple Top)SARS-COV-2 Vaccination 03/18/2019, 04/08/2019, 11/25/2019   Pneumococcal Conjugate-13 09/04/2014   Pneumococcal Polysaccharide-23 09/17/2010, 03/27/2017   Tdap 08/21/2008    TDAP status: Due, Education has been provided regarding the importance of this vaccine. Advised may receive this vaccine at local pharmacy or Health Dept. Aware to provide a copy of the vaccination record if obtained from local pharmacy or Health Dept. Verbalized acceptance and understanding.  Flu Vaccine status: Up to date  Pneumococcal vaccine status: Up to date  Covid-19 vaccine status: Completed vaccines  Qualifies for Shingles Vaccine? Yes   Zostavax completed No   Shingrix Completed?: No.    Education has been provided regarding the importance of this vaccine. Patient has been advised to call insurance company to determine out of pocket expense if they have not yet received this vaccine. Advised may also receive vaccine at local pharmacy or Health Dept. Verbalized acceptance and understanding.  Screening Tests Health Maintenance  Topic Date Due   Zoster Vaccines- Shingrix (1 of 2) Never done   DTaP/Tdap/Td (2 - Td or Tdap) 08/22/2018   COVID-19 Vaccine (4 - 2023-24 season) 10/11/2021   Medicare Annual Wellness (AWV)  06/12/2022   INFLUENZA VACCINE  09/11/2022   COLONOSCOPY (Pts 45-27yrs Insurance  coverage will need to be confirmed)  06/29/2026   Pneumonia Vaccine 66+ Years old  Completed   Hepatitis C Screening  Completed   HPV VACCINES  Aged Out    Health Maintenance  Health Maintenance Due  Topic Date Due   Zoster Vaccines- Shingrix (1 of 2) Never done   DTaP/Tdap/Td (2 - Td or Tdap) 08/22/2018   COVID-19 Vaccine (4 - 2023-24 season) 10/11/2021   Medicare Annual Wellness (AWV)  06/12/2022    Colorectal cancer screening: Type of screening: Colonoscopy. Completed 06/29/2019. Repeat every 7 years  Lung Cancer Screening: (Low Dose CT Chest recommended if Age 38-80 years, 30 pack-year currently smoking OR have quit w/in 15years.) does not qualify.   Lung Cancer Screening Referral: no  Additional Screening:  Hepatitis C Screening: does qualify; Completed 03/27/2017  Vision Screening: Recommended annual ophthalmology exams for  early detection of glaucoma and other disorders of the eye. Is the patient up to date with their annual eye exam?  Yes  Who is the provider or what is the name of the office in which the patient attends annual eye exams? [facility] If pt is not established with a provider, would they like to be referred to a provider to establish care? No .   Dental Screening: Recommended annual dental exams for proper oral hygiene  Community Resource Referral / Chronic Care Management: CRR required this visit?  No   CCM required this visit?  No      Plan:     I have personally reviewed and noted the following in the patient's chart:   Medical and social history Use of alcohol, tobacco or illicit drugs  Current medications and supplements including opioid prescriptions. Patient is not currently taking opioid prescriptions. Functional ability and status Nutritional status Physical activity Advanced directives List of other physicians Hospitalizations, surgeries, and ER visits in previous 12 months Vitals Screenings to include cognitive, depression, and  falls Referrals and appointments  In addition, I have reviewed and discussed with patient certain preventive protocols, quality metrics, and best practice recommendations. A written personalized care plan for preventive services as well as general preventive health recommendations were provided to patient.     Barb Merino, LPN   02/15/1094   Nurse Notes: none  Due to this being a virtual visit, the after visit summary with patients personalized plan was offered to patient via mail or my-chart.  Patient would like to access on my-chart

## 2022-06-16 NOTE — Patient Instructions (Signed)
Jordan Hines , Thank you for taking time to come for your Medicare Wellness Visit. I appreciate your ongoing commitment to your health goals. Please review the following plan we discussed and let me know if I can assist you in the future.   These are the goals we discussed:  Goals      Exercise 3x per week (30 min per time)     Patient states that he wants to start walking cardio     Patient Stated     06/16/2022, wants to lose weight,eating less     Weight (lb) < 200 lb (90.7 kg)        This is a list of the screening recommended for you and due dates:  Health Maintenance  Topic Date Due   Zoster (Shingles) Vaccine (1 of 2) Never done   DTaP/Tdap/Td vaccine (2 - Td or Tdap) 08/22/2018   COVID-19 Vaccine (5 - 2023-24 season) 02/15/2022   Flu Shot  09/11/2022   Medicare Annual Wellness Visit  06/16/2023   Colon Cancer Screening  06/29/2026   Pneumonia Vaccine  Completed   Hepatitis C Screening: USPSTF Recommendation to screen - Ages 18-79 yo.  Completed   HPV Vaccine  Aged Out    Advanced directives: Advance directive discussed with you today.   Conditions/risks identified: smoking  Next appointment: Follow up in one year for your annual wellness visit.   Preventive Care 29 Years and Older, Male  Preventive care refers to lifestyle choices and visits with your health care provider that can promote health and wellness. What does preventive care include? A yearly physical exam. This is also called an annual well check. Dental exams once or twice a year. Routine eye exams. Ask your health care provider how often you should have your eyes checked. Personal lifestyle choices, including: Daily care of your teeth and gums. Regular physical activity. Eating a healthy diet. Avoiding tobacco and drug use. Limiting alcohol use. Practicing safe sex. Taking low doses of aspirin every day. Taking vitamin and mineral supplements as recommended by your health care provider. What  happens during an annual well check? The services and screenings done by your health care provider during your annual well check will depend on your age, overall health, lifestyle risk factors, and family history of disease. Counseling  Your health care provider may ask you questions about your: Alcohol use. Tobacco use. Drug use. Emotional well-being. Home and relationship well-being. Sexual activity. Eating habits. History of falls. Memory and ability to understand (cognition). Work and work Astronomer. Screening  You may have the following tests or measurements: Height, weight, and BMI. Blood pressure. Lipid and cholesterol levels. These may be checked every 5 years, or more frequently if you are over 13 years old. Skin check. Lung cancer screening. You may have this screening every year starting at age 75 if you have a 30-pack-year history of smoking and currently smoke or have quit within the past 15 years. Fecal occult blood test (FOBT) of the stool. You may have this test every year starting at age 1. Flexible sigmoidoscopy or colonoscopy. You may have a sigmoidoscopy every 5 years or a colonoscopy every 10 years starting at age 80. Prostate cancer screening. Recommendations will vary depending on your family history and other risks. Hepatitis C blood test. Hepatitis B blood test. Sexually transmitted disease (STD) testing. Diabetes screening. This is done by checking your blood sugar (glucose) after you have not eaten for a while (fasting). You may have this  done every 1-3 years. Abdominal aortic aneurysm (AAA) screening. You may need this if you are a current or former smoker. Osteoporosis. You may be screened starting at age 45 if you are at high risk. Talk with your health care provider about your test results, treatment options, and if necessary, the need for more tests. Vaccines  Your health care provider may recommend certain vaccines, such as: Influenza vaccine. This  is recommended every year. Tetanus, diphtheria, and acellular pertussis (Tdap, Td) vaccine. You may need a Td booster every 10 years. Zoster vaccine. You may need this after age 40. Pneumococcal 13-valent conjugate (PCV13) vaccine. One dose is recommended after age 47. Pneumococcal polysaccharide (PPSV23) vaccine. One dose is recommended after age 25. Talk to your health care provider about which screenings and vaccines you need and how often you need them. This information is not intended to replace advice given to you by your health care provider. Make sure you discuss any questions you have with your health care provider. Document Released: 02/23/2015 Document Revised: 10/17/2015 Document Reviewed: 11/28/2014 Elsevier Interactive Patient Education  2017 [place] Prevention in the Home Falls can cause injuries. They can happen to people of all ages. There are many things you can do to make your home safe and to help prevent falls. What can I do on the outside of my home? Regularly fix the edges of walkways and driveways and fix any cracks. Remove anything that might make you trip as you walk through a door, such as a raised step or threshold. Trim any bushes or trees on the path to your home. Use bright outdoor lighting. Clear any walking paths of anything that might make someone trip, such as rocks or tools. Regularly check to see if handrails are loose or broken. Make sure that both sides of any steps have handrails. Any raised decks and porches should have guardrails on the edges. Have any leaves, snow, or ice cleared regularly. Use sand or salt on walking paths during winter. Clean up any spills in your garage right away. This includes oil or grease spills. What can I do in the bathroom? Use night lights. Install grab bars by the toilet and in the tub and shower. Do not use towel bars as grab bars. Use non-skid mats or decals in the tub or shower. If you need to sit down  in the shower, use a plastic, non-slip stool. Keep the floor dry. Clean up any water that spills on the floor as soon as it happens. Remove soap buildup in the tub or shower regularly. Attach bath mats securely with double-sided non-slip rug tape. Do not have throw rugs and other things on the floor that can make you trip. What can I do in the bedroom? Use night lights. Make sure that you have a light by your bed that is easy to reach. Do not use any sheets or blankets that are too big for your bed. They should not hang down onto the floor. Have a firm chair that has side arms. You can use this for support while you get dressed. Do not have throw rugs and other things on the floor that can make you trip. What can I do in the kitchen? Clean up any spills right away. Avoid walking on wet floors. Keep items that you use a lot in easy-to-reach places. If you need to reach something above you, use a strong step stool that has a grab bar. Keep electrical cords out of  the way. Do not use floor polish or wax that makes floors slippery. If you must use wax, use non-skid floor wax. Do not have throw rugs and other things on the floor that can make you trip. What can I do with my stairs? Do not leave any items on the stairs. Make sure that there are handrails on both sides of the stairs and use them. Fix handrails that are broken or loose. Make sure that handrails are as long as the stairways. Check any carpeting to make sure that it is firmly attached to the stairs. Fix any carpet that is loose or worn. Avoid having throw rugs at the top or bottom of the stairs. If you do have throw rugs, attach them to the floor with carpet tape. Make sure that you have a light switch at the top of the stairs and the bottom of the stairs. If you do not have them, ask someone to add them for you. What else can I do to help prevent falls? Wear shoes that: Do not have high heels. Have rubber bottoms. Are comfortable  and fit you well. Are closed at the toe. Do not wear sandals. If you use a stepladder: Make sure that it is fully opened. Do not climb a closed stepladder. Make sure that both sides of the stepladder are locked into place. Ask someone to hold it for you, if possible. Clearly mark and make sure that you can see: Any grab bars or handrails. First and last steps. Where the edge of each step is. Use tools that help you move around (mobility aids) if they are needed. These include: Canes. Walkers. Scooters. Crutches. Turn on the lights when you go into a dark area. Replace any light bulbs as soon as they burn out. Set up your furniture so you have a clear path. Avoid moving your furniture around. If any of your floors are uneven, fix them. If there are any pets around you, be aware of where they are. Review your medicines with your doctor. Some medicines can make you feel dizzy. This can increase your chance of falling. Ask your doctor what other things that you can do to help prevent falls. This information is not intended to replace advice given to you by your health care provider. Make sure you discuss any questions you have with your health care provider. Document Released: 11/23/2008 Document Revised: 07/05/2015 Document Reviewed: 03/03/2014 Elsevier Interactive Patient Education  2017 [employer].

## 2022-07-03 ENCOUNTER — Other Ambulatory Visit: Payer: Self-pay

## 2022-07-03 DIAGNOSIS — M109 Gout, unspecified: Secondary | ICD-10-CM

## 2022-07-03 DIAGNOSIS — I10 Essential (primary) hypertension: Secondary | ICD-10-CM

## 2022-07-03 DIAGNOSIS — F32A Depression, unspecified: Secondary | ICD-10-CM

## 2022-07-03 NOTE — Telephone Encounter (Signed)
Received a refill request from [facility] for the following meds:   Lisinopril 10 mg LR  03/18/21, #90, 1 rf  Allopurinol 100 mg LR 26/6/23, #90, 2 rf  Metoprolol Succinate ER 25 mg LR  03/18/21, #90, 1 rf  Sertraline 50 mg LR 03/18/21, #90 mg, 2 rf  LOV 02/17/22 FOV  none scheduled.    All RX;s prescribed by previous provider.   Please review and advise.  Thanks. Dm/cma

## 2022-07-04 MED ORDER — ALLOPURINOL 100 MG PO TABS: 100.0000 mg | ORAL_TABLET | Freq: Every day | ORAL | 3 refills | Status: DC

## 2022-07-04 MED ORDER — METOPROLOL SUCCINATE ER 25 MG PO TB24: 25.0000 mg | ORAL_TABLET | Freq: Every day | ORAL | 3 refills | Status: DC

## 2022-07-04 MED ORDER — LISINOPRIL 10 MG PO TABS: 10.0000 mg | ORAL_TABLET | Freq: Every day | ORAL | 3 refills | Status: DC

## 2022-07-04 MED ORDER — SERTRALINE HCL 50 MG PO TABS: 50.0000 mg | ORAL_TABLET | Freq: Every day | ORAL | 3 refills | Status: DC

## 2022-09-11 ENCOUNTER — Other Ambulatory Visit (HOSPITAL_COMMUNITY): Payer: Self-pay

## 2022-09-16 ENCOUNTER — Telehealth: Payer: Self-pay

## 2022-09-16 ENCOUNTER — Other Ambulatory Visit (HOSPITAL_COMMUNITY): Payer: Self-pay

## 2022-09-16 NOTE — Telephone Encounter (Signed)
Pharmacy Patient Advocate Encounter   Received notification from CoverMyMeds that prior authorization for [facility] 93MCG/ACT exhaler suspension is required/requested.   Insurance verification completed.   The patient is insured through [employer] .   Per test claim: PA required; PA submitted to [facility] via CoverMyMeds Key/confirmation #/[id number] Status is pending

## 2022-09-17 ENCOUNTER — Other Ambulatory Visit (HOSPITAL_COMMUNITY): Payer: Self-pay

## 2022-09-17 MED ORDER — XHANCE 93 MCG/ACT NA EXHU: 2.0000 | INHALANT_SUSPENSION | Freq: Two times a day (BID) | NASAL | 5 refills | Status: AC

## 2022-09-17 NOTE — Telephone Encounter (Signed)
I called patient and left a message to call the office back to inform.

## 2022-09-17 NOTE — Telephone Encounter (Signed)
Pharmacy Patient Advocate Encounter  Received notification from [facility] that Prior Authorization for [facility] 93MCG/ACT exhaler suspension has been APPROVED from 09-16-2022 to 02-10-2023. Ran test claim, Copay is $95.00. Quantity approved 32 mL per 30 days. This test claim was processed through [facility]- copay amounts may vary at other pharmacies due to pharmacy/plan contracts, or as the patient moves through the different stages of their insurance plan.   PA #/Case ID/Reference #: [id number]

## 2022-09-17 NOTE — Telephone Encounter (Signed)
Patient called the office back and I informed that PA has been approved and that it has been sent to [employer]. I informed he should either get a text or a phone call.

## 2022-10-07 DIAGNOSIS — Z8546 Personal history of malignant neoplasm of prostate: Secondary | ICD-10-CM | POA: Diagnosis not present

## 2022-10-07 DIAGNOSIS — C61 Malignant neoplasm of prostate: Secondary | ICD-10-CM | POA: Diagnosis not present

## 2022-10-20 ENCOUNTER — Ambulatory Visit (INDEPENDENT_AMBULATORY_CARE_PROVIDER_SITE_OTHER): Payer: Medicare HMO | Admitting: Family Medicine

## 2022-10-20 VITALS — Temp 98.3°F | Ht 72.0 in | Wt 269.0 lb

## 2022-10-20 DIAGNOSIS — R5383 Other fatigue: Secondary | ICD-10-CM | POA: Diagnosis not present

## 2022-10-20 DIAGNOSIS — Z8546 Personal history of malignant neoplasm of prostate: Secondary | ICD-10-CM

## 2022-10-20 DIAGNOSIS — Z23 Encounter for immunization: Secondary | ICD-10-CM

## 2022-10-20 DIAGNOSIS — I10 Essential (primary) hypertension: Secondary | ICD-10-CM

## 2022-10-20 DIAGNOSIS — F32A Depression, unspecified: Secondary | ICD-10-CM | POA: Diagnosis not present

## 2022-10-20 MED ORDER — SERTRALINE HCL 100 MG PO TABS: 100.0000 mg | ORAL_TABLET | Freq: Every day | ORAL | 3 refills | Status: DC

## 2022-10-20 NOTE — Assessment & Plan Note (Signed)
Blood pressure is in adequate control. Continue lisinopril 10 mg daily and metoprolol succinate 25 mg daily.

## 2022-10-20 NOTE — Progress Notes (Signed)
[facility] [address] [place] [address] Dept: [phone] Dept Fax: [phone]  Office Visit  Subjective:    Patient ID: Jordan Hines, male    DOB: [birth year], 72 y.o..   MRN: [id number]  Chief Complaint  Patient presents with   Fatigue    C/o feeling fatigue x 1 month and some bloating/gas x 2-3 months.  Has taken some laxatives.   Will get flu shot today.    History of Present Illness:  Patient is in today for complaining of a 91-month history of fatigue. He notes some of this feels like he doesn't have his usual energy level. He does feel he gets mildly short of breath when exerting himself doing even minor activities. He denies any orthopnea or PND. He has some mild chronic swelling of his lower legs. He has a history of chronic rhinitis and follows with an allergist regarding this. He wonders if some of this may be his depression not being optimally controlled. He is currently managed on sertraline 50 mg daily. He has noticed that he does avoid certain social events that he would have always gone to in the past.  Past Medical History: Patient Active Problem List   Diagnosis Date Noted   Nasal polyps 08/14/2021   Erectile dysfunction 03/22/2021   History of prostate cancer 03/22/2021   Tobacco use disorder, continuous 03/22/2021   Essential hypertension 12/19/2020   Obesity (BMI 35.0-39.9 without comorbidity) 12/19/2020   Acute gouty arthritis 02/26/2018   Non-allergic rhinitis 01/06/2018   Past Surgical History:  Procedure Laterality Date   COLONOSCOPY  2000   Dr.Orr "normal" per pt-no report    INGUINAL HERNIA REPAIR Right    NASAL SEPTOPLASTY W/ TURBINOPLASTY Bilateral 10/19/2018   Procedure: NASAL SEPTOPLASTY WITH  BILATERAL TURBINATE REDUCTION;  Surgeon: Newman Pies, MD;  Location: [facility];  Service: ENT;  Laterality: Bilateral;   PROSTATE SURGERY     Family History  Problem Relation  Age of Onset   Kidney disease Mother    Heart disease Mother    Asthma Mother    Cancer Father        Non-Hodgkins lymphoma   Diabetes Father    Cancer Sister        Bladder   Cancer Brother        Stomach   Colon polyps Brother    Heart disease Maternal Grandmother    Heart disease Paternal Grandmother    Diabetes Nephew    Diabetes Niece    Allergic rhinitis Neg Hx    Eczema Neg Hx    Urticaria Neg Hx    Colon cancer Neg Hx    Esophageal cancer Neg Hx    Rectal cancer Neg Hx    Stomach cancer Neg Hx    Outpatient Medications Prior to Visit  Medication Sig Dispense Refill   allopurinol (ZYLOPRIM) 100 MG tablet Take 1 tablet (100 mg total) by mouth daily. 90 tablet 3   azelastine (ASTELIN) 0.1 % nasal spray Place 1 spray into both nostrils 2 (two) times daily. Use in each nostril as directed 30 mL 12   cyanocobalamin 1000 MCG tablet Take 1,000 mcg by mouth daily.     doxycycline (VIBRAMYCIN) 100 MG capsule Take 1 capsule (100 mg total) by mouth 2 (two) times daily. 20 capsule 0   dupilumab (DUPIXENT) 300 MG/2ML prefilled syringe Inject 300 mg into the skin every 28 (twenty-eight) days. 4 mL 11   Fluticasone  Propionate (XHANCE) 93 MCG/ACT EXHU Place 2 sprays into the nose 2 (two) times daily. 16 mL 5   ipratropium (ATROVENT) 0.06 % nasal spray USE 1-2 SPRAYS IN EACH NOSTRIL THREE TIMES DAILY AS NEEDED 60 mL 11   lisinopril (ZESTRIL) 10 MG tablet Take 1 tablet (10 mg total) by mouth daily. 90 tablet 3   metoprolol succinate (TOPROL-XL) 25 MG 24 hr tablet Take 1 tablet (25 mg total) by mouth daily. 90 tablet 3   Multiple Vitamin (MULTIVITAMIN WITH MINERALS) TABS tablet Take 1 tablet by mouth daily.     sildenafil (VIAGRA) 100 MG tablet Take 100 mg by mouth daily.     UNABLE TO FIND Take by mouth daily. Golo- wieght loss supplement     sertraline (ZOLOFT) 50 MG tablet Take 1 tablet (50 mg total) by mouth daily. 90 tablet 3   clotrimazole (CLOTRIMAZOLE ANTI-FUNGAL) 1 % cream Apply 1  Application topically 2 (two) times daily. 30 g 0   Facility-Administered Medications Prior to Visit  Medication Dose Route Frequency Provider Last Rate Last Admin   dupilumab (DUPIXENT) prefilled syringe 300 mg  300 mg Subcutaneous Q14 Days Verlee Monte, MD   300 mg at 12/19/21 1601   No Known Allergies   Objective:   Today's Vitals   10/20/22 1416  Temp: 98.3 F (36.8 C)  TempSrc: Temporal  Weight: 269 lb (122 kg)  Height: 6' (1.829 m)   Body mass index is 36.48 kg/m.   General: Well developed, well nourished. No acute distress. Neck: No JVD present. Lungs: Clear to auscultation bilaterally. No wheezing, rales or rhonchi. CV: RRR without murmurs or rubs. Pulses 2+ bilaterally. Extremities: Trace to 1+ edema of lower legs.Marland Kitchen Psych: Alert and oriented. Normal mood and affect.  Health Maintenance Due  Topic Date Due   Zoster Vaccines- Shingrix (1 of 2) Never done   DTaP/Tdap/Td (2 - Td or Tdap) 08/22/2018     Assessment & Plan:   Problem List Items Addressed This Visit       Cardiovascular and Mediastinum   Essential hypertension    Blood pressure is in adequate control. Continue lisinopril 10 mg daily and metoprolol succinate 25 mg daily.       Relevant Orders   Comprehensive metabolic panel     Other   Depression    I will increase his sertraline to 100 mg daily and follow-up in 6 weeks.      Relevant Medications   sertraline (ZOLOFT) 100 MG tablet   History of prostate cancer    I will check a PSA for monitoring.      Relevant Orders   PSA, Medicare   Other Visit Diagnoses     Other fatigue    -  Primary   Etiology unclear. No sign of heart failure. I will check screening labs. I agree that this could be due to his depression issues.   Relevant Orders   TSH   T4, free   PSA, Medicare   CBC   Comprehensive metabolic panel   Vitamin B12   Folate   Need for immunization against influenza       Relevant Orders   Flu Vaccine Trivalent High Dose  (Fluad) (Completed)       Return in about 6 weeks (around 12/01/2022).   Loyola Mast, MD

## 2022-10-20 NOTE — Assessment & Plan Note (Signed)
I will check a PSA for monitoring.

## 2022-10-20 NOTE — Assessment & Plan Note (Signed)
I will increase his sertraline to 100 mg daily and follow-up in 6 weeks.

## 2022-10-21 LAB — COMPREHENSIVE METABOLIC PANEL
ALT: 26 U/L (ref 0–53)
AST: 25 U/L (ref 0–37)
Albumin: 3.8 g/dL (ref 3.5–5.2)
Alkaline Phosphatase: 88 U/L (ref 39–117)
BUN: 17 mg/dL (ref 6–23)
CO2: 29 meq/L (ref 19–32)
Calcium: 9.3 mg/dL (ref 8.4–10.5)
Chloride: 103 meq/L (ref 96–112)
Creatinine, Ser: 1.12 mg/dL (ref 0.40–1.50)
GFR: 65.04 mL/min (ref >60.00)
Glucose, Bld: 75 mg/dL (ref 70–99)
Potassium: 4.4 meq/L (ref 3.5–5.1)
Sodium: 139 meq/L (ref 135–145)
Total Bilirubin: 0.7 mg/dL (ref 0.2–1.2)
Total Protein: 6.8 g/dL (ref 6.0–8.3)

## 2022-10-21 LAB — CBC
HCT: 43.6 % (ref 39.0–52.0)
Hemoglobin: 14.1 g/dL (ref 13.0–17.0)
MCHC: 32.4 g/dL (ref 30.0–36.0)
MCV: 92.7 fl (ref 78.0–100.0)
Platelets: 223 10*3/uL (ref 150.0–400.0)
RBC: 4.71 Mil/uL (ref 4.22–5.81)
RDW: 14.3 % (ref 11.5–15.5)
WBC: 4.6 10*3/uL (ref 4.0–10.5)

## 2022-10-21 LAB — TSH: TSH: 1.18 u[IU]/mL (ref 0.35–5.50)

## 2022-10-21 LAB — T4, FREE: Free T4: 0.7 ng/dL (ref 0.60–1.60)

## 2022-10-21 LAB — FOLATE: Folate: 7.5 ng/mL (ref >5.9)

## 2022-10-21 LAB — VITAMIN B12: Vitamin B-12: 432 pg/mL (ref 211–911)

## 2022-10-21 LAB — PSA, MEDICARE: PSA: 0.07 ng/mL — ABNORMAL LOW (ref 0.10–4.00)

## 2022-10-24 DIAGNOSIS — N3020 Other chronic cystitis without hematuria: Secondary | ICD-10-CM | POA: Diagnosis not present

## 2022-10-24 DIAGNOSIS — Z8546 Personal history of malignant neoplasm of prostate: Secondary | ICD-10-CM | POA: Diagnosis not present

## 2022-10-29 DIAGNOSIS — H5213 Myopia, bilateral: Secondary | ICD-10-CM | POA: Diagnosis not present

## 2022-10-29 DIAGNOSIS — H2513 Age-related nuclear cataract, bilateral: Secondary | ICD-10-CM | POA: Diagnosis not present

## 2022-10-29 DIAGNOSIS — H43393 Other vitreous opacities, bilateral: Secondary | ICD-10-CM | POA: Diagnosis not present

## 2022-11-16 ENCOUNTER — Other Ambulatory Visit: Payer: Self-pay | Admitting: Family Medicine

## 2022-11-16 DIAGNOSIS — J310 Chronic rhinitis: Secondary | ICD-10-CM

## 2022-11-26 ENCOUNTER — Other Ambulatory Visit: Payer: Self-pay | Admitting: *Deleted

## 2022-11-26 MED ORDER — DUPIXENT 300 MG/2ML ~~LOC~~ SOSY: 300.0000 mg | PREFILLED_SYRINGE | SUBCUTANEOUS | 11 refills | Status: DC

## 2022-12-01 ENCOUNTER — Ambulatory Visit: Payer: Medicare HMO | Admitting: Family Medicine

## 2022-12-01 ENCOUNTER — Encounter: Payer: Self-pay | Admitting: Family Medicine

## 2022-12-01 VITALS — BP 136/80 | HR 79 | Temp 98.4°F | Ht 72.0 in | Wt 269.0 lb

## 2022-12-01 DIAGNOSIS — F32A Depression, unspecified: Secondary | ICD-10-CM | POA: Diagnosis not present

## 2022-12-01 DIAGNOSIS — R5383 Other fatigue: Secondary | ICD-10-CM

## 2022-12-01 DIAGNOSIS — Z8546 Personal history of malignant neoplasm of prostate: Secondary | ICD-10-CM | POA: Diagnosis not present

## 2022-12-01 NOTE — Progress Notes (Signed)
[facility] [address] [place] [address] Dept: [phone] Dept Fax: [phone]  Chronic Care Office Visit  Subjective:    Patient ID: Jordan Hines, male    DOB: 1950/10/16, 72 y.o..   MRN: [id number]  Chief Complaint  Patient presents with   Hypertension    6 week f/u.  Wants ears checked.    History of Present Illness:  Patient is in today for reassessment of chronic medical issues. At his last visit 6 weeks ago, he complained of a 59-month history of fatigue. He felt his depression not being optimally controlled. We increased his sertraline dose to 100 mg daily in response. I had also ordered a number of lab tests to evaluate. Since his last visit, he had a follow-up with [facility]. He reportedly had a PSA of 0.44 which raised concerns about possible prostate cancer recurrence.  Past Medical History: Patient Active Problem List   Diagnosis Date Noted   Depression 10/20/2022   Nasal polyps 08/14/2021   Erectile dysfunction 03/22/2021   History of prostate cancer 03/22/2021   Tobacco use disorder, continuous 03/22/2021   Essential hypertension 12/19/2020   Obesity (BMI 35.0-39.9 without comorbidity) 12/19/2020   Acute gouty arthritis 02/26/2018   Non-allergic rhinitis 01/06/2018   Past Surgical History:  Procedure Laterality Date   COLONOSCOPY  2000   Dr.Orr "normal" per pt-no report    INGUINAL HERNIA REPAIR Right    NASAL SEPTOPLASTY W/ TURBINOPLASTY Bilateral 10/19/2018   Procedure: NASAL SEPTOPLASTY WITH  BILATERAL TURBINATE REDUCTION;  Surgeon: Newman Pies, MD;  Location: [facility];  Service: ENT;  Laterality: Bilateral;   PROSTATE SURGERY     Family History  Problem Relation Age of Onset   Kidney disease Mother    Heart disease Mother    Asthma Mother    Cancer Father        Non-Hodgkins lymphoma   Diabetes Father    Cancer Sister        Bladder   Cancer Brother         Stomach   Colon polyps Brother    Heart disease Maternal Grandmother    Heart disease Paternal Grandmother    Diabetes Nephew    Diabetes Niece    Allergic rhinitis Neg Hx    Eczema Neg Hx    Urticaria Neg Hx    Colon cancer Neg Hx    Esophageal cancer Neg Hx    Rectal cancer Neg Hx    Stomach cancer Neg Hx    Outpatient Medications Prior to Visit  Medication Sig Dispense Refill   allopurinol (ZYLOPRIM) 100 MG tablet Take 1 tablet (100 mg total) by mouth daily. 90 tablet 3   azelastine (ASTELIN) 0.1 % nasal spray Place 1 spray into both nostrils 2 (two) times daily. Use in each nostril as directed 30 mL 12   cyanocobalamin 1000 MCG tablet Take 1,000 mcg by mouth daily.     dupilumab (DUPIXENT) 300 MG/2ML prefilled syringe Inject 300 mg into the skin every 14 (fourteen) days. 4 mL 11   Fluticasone Propionate (XHANCE) 93 MCG/ACT EXHU Place 2 sprays into the nose 2 (two) times daily. 16 mL 5   ipratropium (ATROVENT) 0.06 % nasal spray USE 1 SPRAY IN EACH NOSTRIL THREE TIMES DAILY 60 mL 3   lisinopril (ZESTRIL) 10 MG tablet Take 1 tablet (10 mg total) by mouth daily. 90 tablet 3   metoprolol succinate (TOPROL-XL) 25 MG 24 hr tablet  Take 1 tablet (25 mg total) by mouth daily. 90 tablet 3   Multiple Vitamin (MULTIVITAMIN WITH MINERALS) TABS tablet Take 1 tablet by mouth daily.     sertraline (ZOLOFT) 100 MG tablet Take 1 tablet (100 mg total) by mouth daily. 90 tablet 3   sildenafil (VIAGRA) 100 MG tablet Take 100 mg by mouth daily.     UNABLE TO FIND Take by mouth daily. Golo- wieght loss supplement     doxycycline (VIBRAMYCIN) 100 MG capsule Take 1 capsule (100 mg total) by mouth 2 (two) times daily. 20 capsule 0   Facility-Administered Medications Prior to Visit  Medication Dose Route Frequency Provider Last Rate Last Admin   dupilumab (DUPIXENT) prefilled syringe 300 mg  300 mg Subcutaneous Q14 Days Verlee Monte, MD   300 mg at 12/19/21 1601   No Known Allergies Objective:    Today's Vitals   12/01/22 1350  BP: 136/80  Pulse: 79  Temp: 98.4 F (36.9 C)  TempSrc: Temporal  SpO2: 99%  Weight: 269 lb (122 kg)  Height: 6' (1.829 m)   Body mass index is 36.48 kg/m.   General: Well developed, well nourished. No acute distress. Psych: Alert and oriented. Normal mood and affect.  Health Maintenance Due  Topic Date Due   Zoster Vaccines- Shingrix (1 of 2) Never done   DTaP/Tdap/Td (2 - Td or Tdap) 08/22/2018   Lab Results:    Latest Ref Rng & Units 10/20/2022    3:15 PM 02/17/2022    2:20 PM 12/20/2021    9:44 AM  CMP  Glucose 70 - 99 mg/dL 75  90  409   BUN 6 - 23 mg/dL 17   17   Creatinine 8.11 - 1.50 mg/dL 9.14   7.82   Sodium 956 - 145 mEq/L 139   136   Potassium 3.5 - 5.1 mEq/L 4.4   4.0   Chloride 96 - 112 mEq/L 103   102   CO2 19 - 32 mEq/L 29   28   Calcium 8.4 - 10.5 mg/dL 9.3   8.8   Total Protein 6.0 - 8.3 g/dL 6.8     Total Bilirubin 0.2 - 1.2 mg/dL 0.7     Alkaline Phos 39 - 117 U/L 88     AST 0 - 37 U/L 25     ALT 0 - 53 U/L 26         Latest Ref Rng & Units 10/20/2022    3:15 PM 12/20/2020    2:36 PM 05/27/2019   12:51 PM  CBC  WBC 4.0 - 10.5 K/uL 4.6  4.8  9.4   Hemoglobin 13.0 - 17.0 g/dL 21.3  08.6  57.8   Hematocrit 39.0 - 52.0 % 43.6  41.3  43.5   Platelets 150.0 - 400.0 K/uL 223.0  220.0     Lab Results  Component Value Date   TSH 1.18 10/20/2022   Lab Results  Component Value Date   PSA1 0.3 02/07/2017   PSA 0.07 (L) 10/20/2022   PSA 0.19 05/11/2013   Last vitamin B12 and Folate Lab Results  Component Value Date   VITAMINB12 432 10/20/2022   FOLATE 7.5 10/20/2022      12/01/2022    2:10 PM 06/16/2022    4:02 PM 02/17/2022    2:17 PM  Depression screen PHQ 2/9  Decreased Interest 2 0 2  Down, Depressed, Hopeless 0 0 0  PHQ - 2 Score 2 0 2  Altered  sleeping 1  1  Tired, decreased energy 2  3  Change in appetite 0  0  Feeling bad or failure about yourself  0  0  Trouble concentrating 1  0  Moving  slowly or fidgety/restless 0  0  Suicidal thoughts 0  0  PHQ-9 Score 6  6  Difficult doing work/chores Somewhat difficult  Not difficult at all      12/01/2022    2:11 PM 02/17/2022    2:17 PM  GAD 7 : Generalized Anxiety Score  Nervous, Anxious, on Edge 0 0  Control/stop worrying 0 0  Worry too much - different things 0 0  Trouble relaxing 0 0  Restless 0 0  Easily annoyed or irritable 2 2  Afraid - awful might happen 0 0  Total GAD 7 Score 2 2  Anxiety Difficulty Not difficult at all Somewhat difficult     Assessment & Plan:   Problem List Items Addressed This Visit       Other   Depression    Stable. Continue sertraline 100 mg daily.      History of prostate cancer    Our PSA was lower than the assay reported at urology. He will follow up with urology as recommended for repeat testing in Dec.      Other Visit Diagnoses     Other fatigue    -  Primary   Labs are normal. May be physiologic due to age. We will manage expectantly for now.       Return in about 2 months (around 01/31/2023) for Reassessment.   Loyola Mast, MD

## 2022-12-01 NOTE — Assessment & Plan Note (Signed)
Stable. Continue sertraline 100mg daily

## 2022-12-01 NOTE — Assessment & Plan Note (Signed)
Our PSA was lower than the assay reported at urology. He will follow up with urology as recommended for repeat testing in Dec.

## 2023-01-16 DIAGNOSIS — R972 Elevated prostate specific antigen [PSA]: Secondary | ICD-10-CM | POA: Diagnosis not present

## 2023-01-23 DIAGNOSIS — N393 Stress incontinence (female) (male): Secondary | ICD-10-CM | POA: Diagnosis not present

## 2023-01-23 DIAGNOSIS — Z8546 Personal history of malignant neoplasm of prostate: Secondary | ICD-10-CM | POA: Diagnosis not present

## 2023-01-23 DIAGNOSIS — N5231 Erectile dysfunction following radical prostatectomy: Secondary | ICD-10-CM | POA: Diagnosis not present

## 2023-01-30 ENCOUNTER — Ambulatory Visit: Payer: Medicare HMO | Admitting: Family Medicine

## 2023-01-30 ENCOUNTER — Encounter: Payer: Self-pay | Admitting: Family Medicine

## 2023-01-30 VITALS — BP 126/82 | HR 84 | Temp 98.6°F | Ht 72.0 in | Wt 267.4 lb

## 2023-01-30 DIAGNOSIS — K5900 Constipation, unspecified: Secondary | ICD-10-CM

## 2023-01-30 DIAGNOSIS — F17209 Nicotine dependence, unspecified, with unspecified nicotine-induced disorders: Secondary | ICD-10-CM | POA: Diagnosis not present

## 2023-01-30 DIAGNOSIS — I10 Essential (primary) hypertension: Secondary | ICD-10-CM | POA: Diagnosis not present

## 2023-01-30 NOTE — Progress Notes (Signed)
[facility] [address] [place] [address] Dept: [phone] Dept Fax: [phone]  Chronic Care Office Visit  Subjective:    Patient ID: Jordan Hines, male    DOB: 1950-11-20, 72 y.o..   MRN: [id number]  Chief Complaint  Patient presents with   Follow-up    2 month f/u fatigue. Not getting better.  C/o having tightness in middle  abdomen.  Has taken Pepto.    History of Present Illness:  Patient is in today for reassessment of chronic medical issues.  Jordan Hines has a history of hypertension. He is managed on lisinopril 10 mg daily and metoprolol 25 mg daily.  He smokes 1 cigar and 2-3 Black & Milds each day. He has still not reached a point where he is ready to give this up. He also admits that he is pretty inactive most days, spending a fair amount of his day sitting in his recliner.   Jordan Hines notes that for the past 2 weeks, he has felt more gassiness and noted some mild constipation. He tried using some Pepto-Bismol for 2 days, but did not find that this helped very much. He admits that he doesn't get much fiber in his diet.  Past Medical History: Patient Active Problem List   Diagnosis Date Noted   Depression 10/20/2022   Nasal polyps 08/14/2021   Erectile dysfunction 03/22/2021   History of prostate cancer 03/22/2021   Tobacco use disorder, continuous 03/22/2021   Essential hypertension 12/19/2020   Obesity (BMI 35.0-39.9 without comorbidity) 12/19/2020   Acute gouty arthritis 02/26/2018   Non-allergic rhinitis 01/06/2018   Past Surgical History:  Procedure Laterality Date   COLONOSCOPY  2000   Dr.Orr "normal" per pt-no report    INGUINAL HERNIA REPAIR Right    NASAL SEPTOPLASTY W/ TURBINOPLASTY Bilateral 10/19/2018   Procedure: NASAL SEPTOPLASTY WITH  BILATERAL TURBINATE REDUCTION;  Surgeon: Newman Pies, MD;  Location: [facility];  Service: ENT;  Laterality: Bilateral;   PROSTATE  SURGERY     Family History  Problem Relation Age of Onset   Kidney disease Mother    Heart disease Mother    Asthma Mother    Cancer Father        Non-Hodgkins lymphoma   Diabetes Father    Cancer Sister        Bladder   Cancer Brother        Stomach   Colon polyps Brother    Heart disease Maternal Grandmother    Heart disease Paternal Grandmother    Diabetes Nephew    Diabetes Niece    Allergic rhinitis Neg Hx    Eczema Neg Hx    Urticaria Neg Hx    Colon cancer Neg Hx    Esophageal cancer Neg Hx    Rectal cancer Neg Hx    Stomach cancer Neg Hx    Outpatient Medications Prior to Visit  Medication Sig Dispense Refill   allopurinol (ZYLOPRIM) 100 MG tablet Take 1 tablet (100 mg total) by mouth daily. 90 tablet 3   azelastine (ASTELIN) 0.1 % nasal spray Place 1 spray into both nostrils 2 (two) times daily. Use in each nostril as directed 30 mL 12   cyanocobalamin 1000 MCG tablet Take 1,000 mcg by mouth daily.     dupilumab (DUPIXENT) 300 MG/2ML prefilled syringe Inject 300 mg into the skin every 14 (fourteen) days. 4 mL 11   Fluticasone Propionate (XHANCE) 93 MCG/ACT EXHU Place 2 sprays  into the nose 2 (two) times daily. 16 mL 5   ipratropium (ATROVENT) 0.06 % nasal spray USE 1 SPRAY IN EACH NOSTRIL THREE TIMES DAILY 60 mL 3   lisinopril (ZESTRIL) 10 MG tablet Take 1 tablet (10 mg total) by mouth daily. 90 tablet 3   metoprolol succinate (TOPROL-XL) 25 MG 24 hr tablet Take 1 tablet (25 mg total) by mouth daily. 90 tablet 3   Multiple Vitamin (MULTIVITAMIN WITH MINERALS) TABS tablet Take 1 tablet by mouth daily.     sertraline (ZOLOFT) 100 MG tablet Take 1 tablet (100 mg total) by mouth daily. 90 tablet 3   sildenafil (VIAGRA) 100 MG tablet Take 100 mg by mouth daily.     UNABLE TO FIND Take by mouth daily. Golo- wieght loss supplement     Facility-Administered Medications Prior to Visit  Medication Dose Route Frequency Provider Last Rate Last Admin   dupilumab (DUPIXENT)  prefilled syringe 300 mg  300 mg Subcutaneous Q14 Days Verlee Monte, MD   300 mg at 12/19/21 1601   No Known Allergies Objective:   Today's Vitals   01/30/23 1358  BP: 126/82  Pulse: 84  Temp: 98.6 F (37 C)  TempSrc: Temporal  SpO2: 94%  Weight: 267 lb 6.4 oz (121.3 kg)  Height: 6' (1.829 m)   Body mass index is 36.27 kg/m.   General: Well developed, well nourished. No acute distress. Abdomen: Soft, non-tender. Bowel sounds positive, normal pitch and frequency. No hepatosplenomegaly. No rebound   or guarding. No masses. No sign of hernia. Psych: Alert and oriented. Normal mood and affect.  Health Maintenance Due  Topic Date Due   Zoster Vaccines- Shingrix (1 of 2) Never done   DTaP/Tdap/Td (2 - Td or Tdap) 08/22/2018     Assessment & Plan:   Problem List Items Addressed This Visit       Cardiovascular and Mediastinum   Essential hypertension - Primary   Blood pressure is in good control. Continue lisinopril 10 mg daily and metoprolol succinate 25 mg daily.         Other   Tobacco use disorder, continuous   I continue to urge Jordan Hines to stop smoking.      Other Visit Diagnoses       Constipation, unspecified constipation type       Recommend he try and improve intake of fresh vegetables and whole grain breads/cereals. He can add a fiber supplement daily.       Return in about 3 months (around 04/30/2023) for Reassessment.   Loyola Mast, MD

## 2023-01-30 NOTE — Assessment & Plan Note (Signed)
Blood pressure is in good control. Continue lisinopril 10 mg daily and metoprolol succinate 25 mg daily.

## 2023-01-30 NOTE — Patient Instructions (Signed)
Try using Fiber Well gummies for additional fiber supplementation.

## 2023-01-30 NOTE — Assessment & Plan Note (Signed)
I continue to urge Jordan Hines to stop smoking.

## 2023-04-22 ENCOUNTER — Other Ambulatory Visit: Payer: Self-pay | Admitting: Family Medicine

## 2023-04-22 DIAGNOSIS — M109 Gout, unspecified: Secondary | ICD-10-CM

## 2023-04-22 DIAGNOSIS — I10 Essential (primary) hypertension: Secondary | ICD-10-CM

## 2023-05-01 ENCOUNTER — Ambulatory Visit (INDEPENDENT_AMBULATORY_CARE_PROVIDER_SITE_OTHER): Payer: Medicare HMO | Admitting: Family Medicine

## 2023-05-01 ENCOUNTER — Encounter: Payer: Self-pay | Admitting: Family Medicine

## 2023-05-01 VITALS — BP 122/78 | HR 73 | Temp 98.4°F | Ht 72.0 in | Wt 270.2 lb

## 2023-05-01 DIAGNOSIS — I10 Essential (primary) hypertension: Secondary | ICD-10-CM | POA: Diagnosis not present

## 2023-05-01 NOTE — Assessment & Plan Note (Signed)
 Blood pressure is in good control. Continue lisinopril 10 mg daily and metoprolol succinate 25 mg daily. I encourage him to do some walking every day for exercise.

## 2023-05-01 NOTE — Progress Notes (Signed)
 [facility] [address] [place] [address] Dept: [phone] Dept Fax: [phone]  Chronic Care Office Visit  Subjective:    Patient ID: Jordan Hines, male    DOB: [birth year], 73 y.o..   MRN: [id number]  Chief Complaint  Patient presents with   Hypertension    3 month f/u HTN.     History of Present Illness:  Patient is in today for reassessment of chronic medical issues.  Jordan Hines has a history of hypertension. He is managed on lisinopril 10 mg daily and metoprolol 25 mg daily.  He smokes 1 cigar and 2-3 Black & Milds each day. He admits that he is pretty inactive, spending a fair amount of his day sitting in his recliner.   Past Medical History: Patient Active Problem List   Diagnosis Date Noted   Depression 10/20/2022   Nasal polyps 08/14/2021   Erectile dysfunction 03/22/2021   History of prostate cancer 03/22/2021   Tobacco use disorder, continuous 03/22/2021   Essential hypertension 12/19/2020   Obesity (BMI 35.0-39.9 without comorbidity) 12/19/2020   Acute gouty arthritis 02/26/2018   Non-allergic rhinitis 01/06/2018   Past Surgical History:  Procedure Laterality Date   COLONOSCOPY  2000   Dr.Orr "normal" per pt-no report    INGUINAL HERNIA REPAIR Right    NASAL SEPTOPLASTY W/ TURBINOPLASTY Bilateral 10/19/2018   Procedure: NASAL SEPTOPLASTY WITH  BILATERAL TURBINATE REDUCTION;  Surgeon: Newman Pies, MD;  Location: [facility];  Service: ENT;  Laterality: Bilateral;   PROSTATE SURGERY     Family History  Problem Relation Age of Onset   Kidney disease Mother    Heart disease Mother    Asthma Mother    Cancer Father        Non-Hodgkins lymphoma   Diabetes Father    Cancer Sister        Bladder   Cancer Brother        Stomach   Colon polyps Brother    Heart disease Maternal Grandmother    Heart disease Paternal Grandmother    Diabetes Nephew    Diabetes Niece    Allergic  rhinitis Neg Hx    Eczema Neg Hx    Urticaria Neg Hx    Colon cancer Neg Hx    Esophageal cancer Neg Hx    Rectal cancer Neg Hx    Stomach cancer Neg Hx    Outpatient Medications Prior to Visit  Medication Sig Dispense Refill   allopurinol (ZYLOPRIM) 100 MG tablet TAKE 1 TABLET EVERY DAY 90 tablet 3   azelastine (ASTELIN) 0.1 % nasal spray Place 1 spray into both nostrils 2 (two) times daily. Use in each nostril as directed 30 mL 12   cyanocobalamin 1000 MCG tablet Take 1,000 mcg by mouth daily.     Fluticasone Propionate (XHANCE) 93 MCG/ACT EXHU Place 2 sprays into the nose 2 (two) times daily. 16 mL 5   ipratropium (ATROVENT) 0.06 % nasal spray USE 1 SPRAY IN EACH NOSTRIL THREE TIMES DAILY 60 mL 3   lisinopril (ZESTRIL) 10 MG tablet TAKE 1 TABLET EVERY DAY 90 tablet 3   metoprolol succinate (TOPROL-XL) 25 MG 24 hr tablet TAKE 1 TABLET EVERY DAY 90 tablet 3   Multiple Vitamin (MULTIVITAMIN WITH MINERALS) TABS tablet Take 1 tablet by mouth daily.     sertraline (ZOLOFT) 100 MG tablet Take 1 tablet (100 mg total) by mouth daily. 90 tablet 3   sildenafil (VIAGRA) 100 MG  tablet Take 100 mg by mouth daily.     dupilumab (DUPIXENT) 300 MG/2ML prefilled syringe Inject 300 mg into the skin every 14 (fourteen) days. 4 mL 11   Facility-Administered Medications Prior to Visit  Medication Dose Route Frequency Provider Last Rate Last Admin   dupilumab (DUPIXENT) prefilled syringe 300 mg  300 mg Subcutaneous Q14 Days Verlee Monte, MD   300 mg at 12/19/21 1601   No Known Allergies Objective:   Today's Vitals   05/01/23 1354  BP: 122/78  Pulse: 73  Temp: 98.4 F (36.9 C)  TempSrc: Temporal  SpO2: 97%  Weight: 270 lb 3.2 oz (122.6 kg)  Height: 6' (1.829 m)   Body mass index is 36.65 kg/m.   General: Well developed, well nourished. No acute distress. Psych: Alert and oriented. Normal mood and affect.  Health Maintenance Due  Topic Date Due   Zoster Vaccines- Shingrix (1 of 2) Never  done   DTaP/Tdap/Td (2 - Td or Tdap) 08/22/2018   Medicare Annual Wellness (AWV)  06/16/2023     Assessment & Plan:   Problem List Items Addressed This Visit       Cardiovascular and Mediastinum   Essential hypertension - Primary   Blood pressure is in good control. Continue lisinopril 10 mg daily and metoprolol succinate 25 mg daily. I encourage him to do some walking every day for exercise.        Return in about 4 months (around 08/31/2023) for Reassessment.   Loyola Mast, MD

## 2023-08-09 ENCOUNTER — Other Ambulatory Visit: Payer: Self-pay | Admitting: Family Medicine

## 2023-08-09 DIAGNOSIS — F32A Depression, unspecified: Secondary | ICD-10-CM

## 2023-09-04 ENCOUNTER — Encounter: Payer: Self-pay | Admitting: Family Medicine

## 2023-09-04 ENCOUNTER — Ambulatory Visit (INDEPENDENT_AMBULATORY_CARE_PROVIDER_SITE_OTHER): Admitting: Family Medicine

## 2023-09-04 VITALS — BP 118/86 | HR 72 | Temp 98.0°F | Ht 72.0 in | Wt 275.8 lb

## 2023-09-04 DIAGNOSIS — R7303 Prediabetes: Secondary | ICD-10-CM | POA: Insufficient documentation

## 2023-09-04 DIAGNOSIS — E669 Obesity, unspecified: Secondary | ICD-10-CM | POA: Diagnosis not present

## 2023-09-04 DIAGNOSIS — I10 Essential (primary) hypertension: Secondary | ICD-10-CM | POA: Diagnosis not present

## 2023-09-04 NOTE — Assessment & Plan Note (Signed)
 Discussed principles of weight management, including a foundation of a healthy, calorie-controlled diet and regular exercise. I recommend the patient achieve 150 minutes of moderate-intensity exercise weekly. We did discuss the role of medication to augment dietary and exercise efforts. I will refer him to [facility].

## 2023-09-04 NOTE — Progress Notes (Signed)
 [facility] [address] [place] [address] Dept: [phone] Dept Fax: [phone]  Chronic Care Office Visit  Subjective:    Patient ID: Jordan Hines, male    DOB: April 11, 1950, 73 y.o..   MRN: [id number]  Chief Complaint  Patient presents with   Hypertension    Follow up on blood pressure and has sinus problem question. Runny nose, breathing and has medication for it and is still having problems with. Has had surgery on it and seems like it still is not helping.    History of Present Illness:  Patient is in today for reassessment of chronic medical issues.  Jordan Hines has a history of hypertension. He is managed on lisinopril  10 mg daily and metoprolol  25 mg daily.  He smokes 1 cigar and 2-3 Black & Milds each day. He continues to be pretty inactive, spending a fair amount of his day sitting in his recliner. He does not exercise regularly. He notes a concern with his increasing weight and wonders if he might need a weight loss medication.  Past Medical History: Patient Active Problem List   Diagnosis Date Noted   Depression 10/20/2022   Nasal polyps 08/14/2021   Erectile dysfunction 03/22/2021   History of prostate cancer 03/22/2021   Tobacco use disorder, continuous 03/22/2021   Essential hypertension 12/19/2020   Obesity (BMI 35.0-39.9 without comorbidity) 12/19/2020   Acute gouty arthritis 02/26/2018   Non-allergic rhinitis 01/06/2018   Past Surgical History:  Procedure Laterality Date   COLONOSCOPY  2000   Dr.Orr normal per pt-no report    INGUINAL HERNIA REPAIR Right    NASAL SEPTOPLASTY W/ TURBINOPLASTY Bilateral 10/19/2018   Procedure: NASAL SEPTOPLASTY WITH  BILATERAL TURBINATE REDUCTION;  Surgeon: Karis Clunes, MD;  Location: [facility];  Service: ENT;  Laterality: Bilateral;   PROSTATE SURGERY     Family History  Problem Relation Age of Onset   Kidney disease Mother    Heart disease  Mother    Asthma Mother    Cancer Father        Non-Hodgkins lymphoma   Diabetes Father    Cancer Sister        Bladder   Cancer Brother        Stomach   Colon polyps Brother    Heart disease Maternal Grandmother    Heart disease Paternal Grandmother    Diabetes Nephew    Diabetes Niece    Allergic rhinitis Neg Hx    Eczema Neg Hx    Urticaria Neg Hx    Colon cancer Neg Hx    Esophageal cancer Neg Hx    Rectal cancer Neg Hx    Stomach cancer Neg Hx    Outpatient Medications Prior to Visit  Medication Sig Dispense Refill   allopurinol  (ZYLOPRIM ) 100 MG tablet TAKE 1 TABLET EVERY DAY 90 tablet 3   azelastine  (ASTELIN ) 0.1 % nasal spray Place 1 spray into both nostrils 2 (two) times daily. Use in each nostril as directed 30 mL 12   Fluticasone  Propionate (XHANCE ) 93 MCG/ACT EXHU Place 2 sprays into the nose 2 (two) times daily. 16 mL 5   ipratropium (ATROVENT ) 0.06 % nasal spray USE 1 SPRAY IN EACH NOSTRIL THREE TIMES DAILY 60 mL 3   lisinopril  (ZESTRIL ) 10 MG tablet TAKE 1 TABLET EVERY DAY 90 tablet 3   metoprolol  succinate (TOPROL -XL) 25 MG 24 hr tablet TAKE 1 TABLET EVERY DAY 90 tablet 3   sertraline  (  ZOLOFT ) 100 MG tablet TAKE 1 TABLET EVERY DAY 90 tablet 0   sildenafil (VIAGRA) 100 MG tablet Take 100 mg by mouth daily.     cyanocobalamin  1000 MCG tablet Take 1,000 mcg by mouth daily. (Patient not taking: Reported on 09/04/2023)     dupilumab  (DUPIXENT ) 300 MG/2ML prefilled syringe Inject 300 mg into the skin every 14 (fourteen) days. (Patient not taking: Reported on 09/04/2023) 4 mL 11   Multiple Vitamin (MULTIVITAMIN WITH MINERALS) TABS tablet Take 1 tablet by mouth daily. (Patient not taking: Reported on 09/04/2023)     dupilumab  (DUPIXENT ) prefilled syringe 300 mg      No facility-administered medications prior to visit.   No Known Allergies Objective:   Today's Vitals   09/04/23 1351 09/04/23 1356  BP: (!) 142/98 118/86  Pulse: 72   Temp: 98 F (36.7 C)   TempSrc:  Oral   SpO2: 97%   Weight: 275 lb 12.8 oz (125.1 kg)   Height: 6' (1.829 m)    Body mass index is 37.41 kg/m.   General: Well developed, well nourished. No acute distress. Psych: Alert and oriented. Normal mood and affect.  Health Maintenance Due  Topic Date Due   Zoster Vaccines- Shingrix (1 of 2) Never done   DTaP/Tdap/Td (2 - Td or Tdap) 08/22/2018   Medicare Annual Wellness (AWV)  06/16/2023     Assessment & Plan:   Problem List Items Addressed This Visit       Cardiovascular and Mediastinum   Essential hypertension   Blood pressure is in good control with rest int he clinic. Continue lisinopril  10 mg daily and metoprolol  succinate 25 mg daily. I encourage him to do some walking every day for exercise. Discussed him going to the mall to walk in this hot weather.         Other   Obesity (BMI 35.0-39.9 without comorbidity) - Primary   Discussed principles of weight management, including a foundation of a healthy, calorie-controlled diet and regular exercise. I recommend the patient achieve 150 minutes of moderate-intensity exercise weekly. We did discuss the role of medication to augment dietary and exercise efforts. I will refer him to [facility].       Relevant Orders   Amb Ref to Medical Weight Management    Return in about 3 months (around 12/05/2023) for Reassessment.   Garnette CHRISTELLA Simpler, MD

## 2023-09-04 NOTE — Assessment & Plan Note (Signed)
 Blood pressure is in good control with rest int he clinic. Continue lisinopril  10 mg daily and metoprolol  succinate 25 mg daily. I encourage him to do some walking every day for exercise. Discussed him going to the mall to walk in this hot weather.

## 2023-09-05 ENCOUNTER — Other Ambulatory Visit: Payer: Self-pay | Admitting: Family Medicine

## 2023-09-05 DIAGNOSIS — J310 Chronic rhinitis: Secondary | ICD-10-CM

## 2023-09-08 ENCOUNTER — Encounter (INDEPENDENT_AMBULATORY_CARE_PROVIDER_SITE_OTHER): Payer: Self-pay

## 2023-09-17 ENCOUNTER — Encounter (INDEPENDENT_AMBULATORY_CARE_PROVIDER_SITE_OTHER): Payer: Self-pay | Admitting: Nurse Practitioner

## 2023-09-17 ENCOUNTER — Ambulatory Visit (INDEPENDENT_AMBULATORY_CARE_PROVIDER_SITE_OTHER): Admitting: Nurse Practitioner

## 2023-09-17 VITALS — BP 126/81 | HR 87 | Temp 97.9°F | Ht 71.0 in | Wt 269.0 lb

## 2023-09-17 DIAGNOSIS — Z6837 Body mass index (BMI) 37.0-37.9, adult: Secondary | ICD-10-CM | POA: Diagnosis not present

## 2023-09-17 DIAGNOSIS — I10 Essential (primary) hypertension: Secondary | ICD-10-CM

## 2023-09-17 DIAGNOSIS — E66812 Obesity, class 2: Secondary | ICD-10-CM | POA: Diagnosis not present

## 2023-09-17 DIAGNOSIS — F320 Major depressive disorder, single episode, mild: Secondary | ICD-10-CM | POA: Diagnosis not present

## 2023-09-17 DIAGNOSIS — E781 Pure hyperglyceridemia: Secondary | ICD-10-CM

## 2023-09-17 DIAGNOSIS — R7303 Prediabetes: Secondary | ICD-10-CM

## 2023-09-17 NOTE — Progress Notes (Signed)
 [address] [place], [place], [place] [address] Office: [phone]  /  Fax: [phone]   Initial Consultation    Jordan Hines was seen in clinic today to evaluate for obesity. He is interested in losing weight to improve overall health and reduce the risk of weight related complications. He presents today to review program treatment options, initial physical assessment, and evaluation.    He does have hypertension and is currently well controlled with lisinopril  10 mg every day and Metoprolol  25 mg every day.  He is followed by his primary care physician Dr. Thedora.  He also has hypertriglyceridemia but is currently on no medication for this condition  He has noticed increasing weight and discussed with PCP who referred pt to [facility]. He has diminished activity and BMI is 37.5   He does have prediabetes with A1c of 6.9 previously. Most recent A1c 02/17/22 was 6.1.  Currently on no medication.   He is on Zoloft  100 mg every day for depression and describes symptoms as well controlled  Anthropometrics and Bioimpedance Analysis   Body mass index is 37.52 kg/m. Body Fat Mass : 37.1 % Visceral Fat Mass Rating : 25   Obesity Related Diseases and Complications  Obesity Quality of Life and Psychosocial Complications: Reduced health-related quality of life  Cardiometabolic: Prediabetes and/or insulin resistance, Dyslipidemia or hypercholesterolemia, Hypertension, DOE, and Fatigue  Biomechanical: Impaired mobility and gait dysfunction   Weight Related History  He was referred by: PCP  When asked what they would like to accomplish? He states: Adopt a healthier eating pattern and lifestyle, Improve energy levels and physical activity, Improve existing medical conditions, and Improve quality of life  Weight history: He first noticed weight gain after the age of 42 . 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  Highest weight: 278  pounds  Contributing factors: family history of obesity, disruption of circadian rhythm / sleep disordered breathing, consumption of processed foods, use of obesogenic medications: Beta-blockers, reduced physical activity, and chronic skipping of meals  Prior weight loss attempts: Golo  Current or previous pharmacotherapy: None  Response to medication: Never tried medications  Current nutrition plan: None  Greatest challenge with dieting: difficulty maintaining reduced calorie state and meal preparation and cooking.  Current level of physical activity: Walking 4000 steps a day  Barriers to Exercise: motivation  Readiness and Motivation  On a scale from 0 to 10 How ready are you to make changes to your eating and physical activity to lose weight? 8 How important is it for you to lose weight right now ? 10 How confident are you that you can lose weight if you try? 10  Past Medical History   Past Medical History:  Diagnosis Date   Anxiety    Arthritis    Cancer (HCC) 2011   Prostate cancer  sx in 2011   Depression    Deviated septum    Hypertension    Nasal turbinate hypertrophy      Objective    BP 126/81   Pulse 87   Temp 97.9 F (36.6 C)   Ht 5' 11 (1.803 m)   Wt 269 lb (122 kg)   SpO2 99%   BMI 37.52 kg/m  He was weighed on the bioimpedance scale: Body mass index is 37.52 kg/m.    General:  Alert, oriented and cooperative. Patient is in no acute distress.  Respiratory: Normal respiratory effort, no problems with respiration noted   Gait: able to ambulate  independently  Mental Status: Normal mood and affect. Normal behavior. Normal judgment and thought content.   Diagnostic Data Reviewed  BMET    Component Value Date/Time   NA 139 10/20/2022 1515   NA 138 12/09/2019 1523   K 4.4 10/20/2022 1515   CL 103 10/20/2022 1515   CO2 29 10/20/2022 1515   GLUCOSE 75 10/20/2022 1515   BUN 17 10/20/2022 1515   BUN 20 12/09/2019 1523   CREATININE 1.12  10/20/2022 1515   CREATININE 1.21 (H) 06/13/2020 1507   CALCIUM 9.3 10/20/2022 1515   GFRNONAA 60 06/13/2020 1507   GFRAA 69 06/13/2020 1507   Lab Results  Component Value Date   HGBA1C 6.1 02/17/2022   HGBA1C 6.3 (H) 09/04/2014   No results found for: INSULIN CBC    Component Value Date/Time   WBC 4.6 10/20/2022 1515   RBC 4.71 10/20/2022 1515   HGB 14.1 10/20/2022 1515   HGB 15.1 04/04/2019 1109   HCT 43.6 10/20/2022 1515   HCT 43.9 04/04/2019 1109   PLT 223.0 10/20/2022 1515   PLT 243 04/04/2019 1109   MCV 92.7 10/20/2022 1515   MCV 91.0 05/27/2019 1251   MCV 88 04/04/2019 1109   MCH 29.4 05/27/2019 1251   MCH 28.7 05/11/2013 1737   MCHC 32.4 10/20/2022 1515   RDW 14.3 10/20/2022 1515   RDW 14.2 04/04/2019 1109   Iron/TIBC/Ferritin/ %Sat No results found for: IRON, TIBC, FERRITIN, IRONPCTSAT Lipid Panel     Component Value Date/Time   CHOL 185 12/20/2020 1436   CHOL 183 12/09/2019 1523   TRIG 180.0 (H) 12/20/2020 1436   HDL 77.30 12/20/2020 1436   HDL 80 12/09/2019 1523   CHOLHDL 2 12/20/2020 1436   VLDL 36.0 12/20/2020 1436   LDLCALC 71 12/20/2020 1436   LDLCALC 78 12/09/2019 1523   Hepatic Function Panel     Component Value Date/Time   PROT 6.8 10/20/2022 1515   PROT 6.8 12/09/2019 1523   ALBUMIN 3.8 10/20/2022 1515   ALBUMIN 4.0 12/09/2019 1523   AST 25 10/20/2022 1515   ALT 26 10/20/2022 1515   ALKPHOS 88 10/20/2022 1515   BILITOT 0.7 10/20/2022 1515   BILITOT 0.8 12/09/2019 1523      Component Value Date/Time   TSH 1.18 10/20/2022 1515    Medications  Outpatient Encounter Medications as of 09/17/2023  Medication Sig   allopurinol  (ZYLOPRIM ) 100 MG tablet TAKE 1 TABLET EVERY DAY   azelastine  (ASTELIN ) 0.1 % nasal spray Place 1 spray into both nostrils 2 (two) times daily. Use in each nostril as directed   Fluticasone  Propionate (XHANCE ) 93 MCG/ACT EXHU Place 2 sprays into the nose 2 (two) times daily.   ipratropium (ATROVENT ) 0.06  % nasal spray USE 1 SPRAY IN EACH NOSTRIL THREE TIMES DAILY   lisinopril  (ZESTRIL ) 10 MG tablet TAKE 1 TABLET EVERY DAY   metoprolol  succinate (TOPROL -XL) 25 MG 24 hr tablet TAKE 1 TABLET EVERY DAY   sertraline  (ZOLOFT ) 100 MG tablet TAKE 1 TABLET EVERY DAY   sildenafil (VIAGRA) 100 MG tablet Take 100 mg by mouth daily.   No facility-administered encounter medications on file as of 09/17/2023.     Assessment and Plan   Essential hypertension  Prediabetes  Hypertriglyceridemia  Mild major depression (HCC)  Class 2 severe obesity with serious comorbidity and body mass index (BMI) of 37.0 to 37.9 in adult, unspecified obesity type (HCC)       Obesity Treatment and Action Plan:  Patient will work  on garnering support from family and friends to begin weight loss journey. Will work on eliminating or reducing the presence of highly palatable, calorie dense foods in the home. Will complete provided nutritional and psychosocial assessment questionnaire before the next appointment. Will be scheduled for indirect calorimetry to determine resting energy expenditure in a fasting state.  This will allow us  to create a reduced calorie, high-protein meal plan to promote loss of fat mass while preserving muscle mass. Counseled on the health benefits of losing 5%-15% of total body weight. Was counseled on nutritional approaches to weight loss and benefits of reducing processed foods and consuming plant-based foods and high quality protein as part of nutritional weight management. Was counseled on pharmacotherapy and role as an adjunct in weight management.   Education and Additional resources  He was weighed on the bioimpedance scale and results were discussed and documented in the synopsis.  We discussed obesity as a progressive, chronic disease and the importance of a more detailed evaluation of all the factors contributing to the disease.  We reviewed the basic principles in obesity  management.   We discussed the importance of long term lifestyle changes which include nutrition, exercise and behavioral modification as well as the importance of customizing this to his specific health and social needs.  We reviewed the role of medical interventions including pharmacotherapy and surgical interventions.   We discussed the benefits of reaching a healthier weight to alleviate the symptoms of existing conditions and reduce the risks of the biomechanical, cardiometabolic and psychological effects of obesity.  We reviewed our program approach and philosophy, which are guided by the four pillars of obesity medicine.  We discussed how to prepare for intake appointment and the importance of fasting and avoidance of stimulants for at least 8 hours prior to indirect calorimetry.  Jeran G Kurtenbach appears to be in the action stage of change and reports being ready to initiate intensive lifestyle and behavioral modifications as part of their weight loss journey.  Attestation  Reviewed by clinician on day of visit: allergies, medications, problem list, medical history, surgical history, family history, social history, and previous encounter notes pertinent to obesity diagnosis.  I have spent  37 minutes in the care of the patient today including: 11 minutes before the visit reviewing and preparing the chart. 23 minutes face-to-face assessing and reviewing listed medical problems as outlined in obesity care plan, providing nutritional and behavioral counseling on topics outlined in the obesity care plan, independently interpreting test results and goals of care, as described in assessment and plan, reviewing and discussing biometric information and progress, and reviewing latest PCP notes and specialist consultations 3 minutes after the visit updating chart and documentation of encounter.  Jahaira Earnhart ANP-C

## 2023-10-16 ENCOUNTER — Telehealth: Payer: Self-pay | Admitting: Family Medicine

## 2023-10-16 NOTE — Telephone Encounter (Signed)
 ERROR

## 2023-10-23 ENCOUNTER — Other Ambulatory Visit: Payer: Self-pay | Admitting: Family Medicine

## 2023-10-23 DIAGNOSIS — F32A Depression, unspecified: Secondary | ICD-10-CM

## 2023-11-13 ENCOUNTER — Ambulatory Visit

## 2023-11-13 DIAGNOSIS — Z0000 Encounter for general adult medical examination without abnormal findings: Secondary | ICD-10-CM | POA: Diagnosis not present

## 2023-11-13 NOTE — Progress Notes (Signed)
 Subjective:   Jordan Hines is a 73 y.o. who presents for a Medicare Wellness preventive visit.  As a reminder, Annual Wellness Visits don't include a physical exam, and some assessments may be limited, especially if this visit is performed virtually. We may recommend an in-person follow-up visit with your provider if needed.  Visit Complete: Virtual I connected with  Jordan Hines on 11/13/23 by a audio enabled telemedicine application and verified that I am speaking with the correct person using two identifiers.  Patient Location: Home  Provider Location: Office/Clinic  I discussed the limitations of evaluation and management by telemedicine. The patient expressed understanding and agreed to proceed.  Vital Signs: Because this visit was a virtual/telehealth visit, some criteria may be missing or patient reported. Any vitals not documented were not able to be obtained and vitals that have been documented are patient reported.  VideoError- Librarian, academic were attempted between this provider and patient, however failed, due to patient having technical difficulties OR patient did not have access to video capability.  We continued and completed visit with audio only.   Persons Participating in Visit: Patient.  AWV Questionnaire: Yes: Patient Medicare AWV questionnaire was completed by the patient on 11/12/2023; I have confirmed that all information answered by patient is correct and no changes since this date.  Cardiac Risk Factors include: advanced age (>68men, >101 women);hypertension;male gender     Objective:    Today's Vitals   There is no height or weight on file to calculate BMI.     11/13/2023    3:40 PM 06/16/2022    4:01 PM 06/11/2021    4:05 PM 08/28/2020    1:59 PM 06/21/2019   11:01 AM 10/19/2018    7:55 AM 10/12/2018    4:20 PM  Advanced Directives  Does Patient Have a Medical Advance Directive? No No No No No No No  Would patient like  information on creating a medical advance directive? No - Patient declined  No - Patient declined Yes (MAU/Ambulatory/Procedural Areas - Information given) Yes (ED - Information included in AVS) No - Patient declined No - Patient declined    Current Medications (verified) Outpatient Encounter Medications as of 11/13/2023  Medication Sig   allopurinol  (ZYLOPRIM ) 100 MG tablet TAKE 1 TABLET EVERY DAY   azelastine  (ASTELIN ) 0.1 % nasal spray Place 1 spray into both nostrils 2 (two) times daily. Use in each nostril as directed   Fluticasone  Propionate (XHANCE ) 93 MCG/ACT EXHU Place 2 sprays into the nose 2 (two) times daily.   ipratropium (ATROVENT ) 0.06 % nasal spray USE 1 SPRAY IN EACH NOSTRIL THREE TIMES DAILY   lisinopril  (ZESTRIL ) 10 MG tablet TAKE 1 TABLET EVERY DAY   metoprolol  succinate (TOPROL -XL) 25 MG 24 hr tablet TAKE 1 TABLET EVERY DAY   sertraline  (ZOLOFT ) 100 MG tablet TAKE 1 TABLET EVERY DAY   sildenafil (VIAGRA) 100 MG tablet Take 100 mg by mouth daily.   No facility-administered encounter medications on file as of 11/13/2023.    Allergies (verified) Patient has no known allergies.   History: Past Medical History:  Diagnosis Date   Anxiety    Arthritis    Cancer (HCC) 2011   Prostate cancer  sx in 2011   Depression    Deviated septum    Hypertension    Nasal turbinate hypertrophy    Past Surgical History:  Procedure Laterality Date   COLONOSCOPY  2000   Dr.Orr normal per pt-no report  INGUINAL HERNIA REPAIR Right    NASAL SEPTOPLASTY W/ TURBINOPLASTY Bilateral 10/19/2018   Procedure: NASAL SEPTOPLASTY WITH  BILATERAL TURBINATE REDUCTION;  Surgeon: Karis Clunes, MD;  Location: [facility];  Service: ENT;  Laterality: Bilateral;   PROSTATE SURGERY     Family History  Problem Relation Age of Onset   Kidney disease Mother    Heart disease Mother    Asthma Mother    Cancer Father        Non-Hodgkins lymphoma   Diabetes Father    Cancer Sister         Bladder   Cancer Brother        Stomach   Colon polyps Brother    Heart disease Maternal Grandmother    Heart disease Paternal Grandmother    Diabetes Nephew    Diabetes Niece    Allergic rhinitis Neg Hx    Eczema Neg Hx    Urticaria Neg Hx    Colon cancer Neg Hx    Esophageal cancer Neg Hx    Rectal cancer Neg Hx    Stomach cancer Neg Hx    Social History   Socioeconomic History   Marital status: Divorced    Spouse name: Not on file   Number of children: 1   Years of education: Not on file   Highest education level: Some college, no degree  Occupational History   Not on file  Tobacco Use   Smoking status: Every Day    Types: Cigars    Passive exposure: Past   Smokeless tobacco: Never   Tobacco comments:    about 2-3 small cigars daily  Vaping Use   Vaping status: Never Used  Substance and Sexual Activity   Alcohol use: Yes    Alcohol/week: 3.0 standard drinks of alcohol    Types: 1 Cans of beer, 2 Shots of liquor per week    Comment: socially   Drug use: Not Currently    Comment: CBD   Sexual activity: Yes  Other Topics Concern   Not on file  Social History Narrative   Not on file   Social Drivers of Health   Financial Resource Strain: Low Risk  (11/13/2023)   Overall Financial Resource Strain (CARDIA)    Difficulty of Paying Living Expenses: Not hard at all  Food Insecurity: No Food Insecurity (11/13/2023)   Hunger Vital Sign    Worried About Running Out of Food in the Last Year: Never true    Ran Out of Food in the Last Year: Never true  Transportation Needs: No Transportation Needs (11/13/2023)   PRAPARE - Administrator, Civil Service (Medical): No    Lack of Transportation (Non-Medical): No  Physical Activity: Inactive (11/13/2023)   Exercise Vital Sign    Days of Exercise per Week: 0 days    Minutes of Exercise per Session: 0 min  Stress: No Stress Concern Present (11/13/2023)   [employer] of Occupational Health - Occupational  Stress Questionnaire    Feeling of Stress: Not at all  Social Connections: Moderately Isolated (11/13/2023)   Social Connection and Isolation Panel    Frequency of Communication with Friends and Family: More than three times a week    Frequency of Social Gatherings with Friends and Family: More than three times a week    Attends Religious Services: More than 4 times per year    Active Member of [employer] or Organizations: No    Attends Banker Meetings: Never  Marital Status: Divorced    Tobacco Counseling Ready to quit: Not Answered Counseling given: Not Answered Tobacco comments: about 2-3 small cigars daily    Clinical Intake:  Pre-visit preparation completed: Yes  Pain : No/denies pain     Nutritional Risks: None Diabetes: No  Lab Results  Component Value Date   HGBA1C 6.1 02/17/2022   HGBA1C 6.0 06/13/2020   HGBA1C 6.2 (H) 12/09/2019     How often do you need to have someone help you when you read instructions, pamphlets, or other written materials from your doctor or pharmacy?: 1 - Never  Interpreter Needed?: No  Information entered by :: NAllen LPN   Activities of Daily Living     11/12/2023   11:25 AM 06/15/2023    9:54 PM  In your present state of health, do you have any difficulty performing the following activities:  Hearing? 0 0  Vision? 0 0  Difficulty concentrating or making decisions? 0 0  Walking or climbing stairs? 0 0  Dressing or bathing? 0 0  Doing errands, shopping? 0 0  Preparing Food and eating ? N N  In the past six months, have you accidently leaked urine? Y Y  Do you have problems with loss of bowel control? N N  Managing your Medications? N N  Managing your Finances? N N  Housekeeping or managing your Housekeeping? N N    Patient Care Team: Thedora Garnette HERO, MD as PCP - General (Family Medicine) Luke Orlan HERO, DO as Consulting Physician (Allergy ) Karis Clunes, MD as Consulting Physician (Otolaryngology) Dann Candyce RAMAN, MD as Consulting Physician (Cardiology) Cam Morene ORN, MD as Attending Physician (Urology)  I have updated your Care Teams any recent Medical Services you may have received from other providers in the past year.     Assessment:   This is a routine wellness examination for Pope.  Hearing/Vision screen Hearing Screening - Comments:: Denies hearing issues Vision Screening - Comments:: Regular eye exams, [facility]   Goals Addressed             This Visit's Progress    Patient Stated       11/13/2023, wants to lose weight       Depression Screen     11/13/2023    3:41 PM 09/04/2023    1:48 PM 12/01/2022    2:10 PM 06/16/2022    4:02 PM 02/17/2022    2:17 PM 09/20/2021    2:39 PM 06/11/2021    4:01 PM  PHQ 2/9 Scores  PHQ - 2 Score 0 0 2 0 2 1 0  PHQ- 9 Score 0  6  6 6      Fall Risk     11/12/2023   11:25 AM 09/04/2023    1:48 PM 06/15/2023    9:54 PM 06/16/2022    4:02 PM 06/15/2022    4:05 PM  Fall Risk   Falls in the past year? 0 0 0 0   Number falls in past yr: 0 0  0   Injury with Fall? 0 0  0 0  Risk for fall due to : Medication side effect No Fall Risks  Medication side effect   Follow up Falls prevention discussed;Falls evaluation completed Falls evaluation completed  Falls prevention discussed;Education provided;Falls evaluation completed     MEDICARE RISK AT HOME:  Medicare Risk at Home Any stairs in or around the home?: Yes If so, are there any without handrails?: No Home free of  loose throw rugs in walkways, pet beds, electrical cords, etc?: (Patient-Rptd) Yes Adequate lighting in your home to reduce risk of falls?: (Patient-Rptd) Yes Life alert?: (Patient-Rptd) No Use of a cane, walker or w/c?: (Patient-Rptd) No Grab bars in the bathroom?: (Patient-Rptd) No Shower chair or bench in shower?: (Patient-Rptd) No Elevated toilet seat or a handicapped toilet?: (Patient-Rptd) No  TIMED UP AND GO:  Was the test performed?  No  Cognitive Function:  6CIT completed        11/13/2023    3:42 PM 06/16/2022    4:03 PM 06/11/2021    4:09 PM 06/21/2019   11:01 AM 04/14/2018   11:16 AM  6CIT Screen  What Year? 0 points 0 points 0 points 0 points 0 points  What month? 0 points 0 points 0 points 0 points 0 points  What time? 0 points 0 points 0 points 0 points 0 points  Count back from 20 0 points 0 points 0 points 0 points 0 points  Months in reverse 0 points 0 points 0 points 0 points 0 points  Repeat phrase 2 points 2 points 0 points 2 points 0 points  Total Score 2 points 2 points 0 points 2 points 0 points    Immunizations Immunization History  Administered Date(s) Administered   Fluad Quad(high Dose 65+) 12/10/2018, 11/21/2019, 12/19/2020   Fluad Trivalent(High Dose 65+) 10/20/2022   INFLUENZA, HIGH DOSE SEASONAL PF 11/18/2017   Influenza,inj,Quad PF,6+ Mos 12/11/2014, 03/27/2017   Influenza-Unspecified 11/16/2017, 12/12/2021   PFIZER(Purple Top)SARS-COV-2 Vaccination 03/18/2019, 04/08/2019, 11/25/2019   Pneumococcal Conjugate-13 09/04/2014   Pneumococcal Polysaccharide-23 09/17/2010, 03/27/2017   Tdap 08/21/2008   Unspecified SARS-COV-2 Vaccination 12/21/2021    Screening Tests Health Maintenance  Topic Date Due   Zoster Vaccines- Shingrix (1 of 2) Never done   DTaP/Tdap/Td (2 - Td or Tdap) 08/22/2018   Influenza Vaccine  09/11/2023   COVID-19 Vaccine (5 - 2025-26 season) 10/12/2023   Medicare Annual Wellness (AWV)  11/12/2024   Colonoscopy  06/29/2026   Pneumococcal Vaccine: 50+ Years  Completed   Hepatitis C Screening  Completed   HPV VACCINES  Aged Out   Meningococcal B Vaccine  Aged Out    Health Maintenance Items Addressed: Vaccines Due: flu, TDAP, shingles  Additional Screening:  Vision Screening: Recommended annual ophthalmology exams for early detection of glaucoma and other disorders of the eye. Is the patient up to date with their annual eye exam?  Yes  Who is the provider or what is the name of the  office in which the patient attends annual eye exams? [facility]  Dental Screening: Recommended annual dental exams for proper oral hygiene  Community Resource Referral / Chronic Care Management: CRR required this visit?  No   CCM required this visit?  No   Plan:    I have personally reviewed and noted the following in the patient's chart:   Medical and social history Use of alcohol, tobacco or illicit drugs  Current medications and supplements including opioid prescriptions. Patient is not currently taking opioid prescriptions. Functional ability and status Nutritional status Physical activity Advanced directives List of other physicians Hospitalizations, surgeries, and ER visits in previous 12 months Vitals Screenings to include cognitive, depression, and falls Referrals and appointments  In addition, I have reviewed and discussed with patient certain preventive protocols, quality metrics, and best practice recommendations. A written personalized care plan for preventive services as well as general preventive health recommendations were provided to patient.   Fayette Hamada E  Dasie, LPN   [id number]   After Visit Summary: (MyChart) Due to this being a telephonic visit, the after visit summary with patients personalized plan was offered to patient via MyChart   Notes: Nothing significant to report at this time.

## 2023-11-13 NOTE — Patient Instructions (Signed)
 Jordan Hines,  Thank you for taking the time for your Medicare Wellness Visit. I appreciate your continued commitment to your health goals. Please review the care plan we discussed, and feel free to reach out if I can assist you further.  Medicare recommends these wellness visits once per year to help you and your care team stay ahead of potential health issues. These visits are designed to focus on prevention, allowing your provider to concentrate on managing your acute and chronic conditions during your regular appointments.  Please note that Annual Wellness Visits do not include a physical exam. Some assessments may be limited, especially if the visit was conducted virtually. If needed, we may recommend a separate in-person follow-up with your provider.  Ongoing Care Seeing your primary care provider every 3 to 6 months helps us  monitor your health and provide consistent, personalized care.   Referrals If a referral was made during today's visit and you haven't received any updates within two weeks, please contact the referred provider directly to check on the status.  Recommended Screenings:  Health Maintenance  Topic Date Due   Zoster (Shingles) Vaccine (1 of 2) Never done   DTaP/Tdap/Td vaccine (2 - Td or Tdap) 08/22/2018   Flu Shot  09/11/2023   COVID-19 Vaccine (5 - 2025-26 season) 10/12/2023   Medicare Annual Wellness Visit  11/12/2024   Colon Cancer Screening  06/29/2026   Pneumococcal Vaccine for age over 28  Completed   Hepatitis C Screening  Completed   HPV Vaccine  Aged Out   Meningitis B Vaccine  Aged Out       11/13/2023    3:40 PM  Advanced Directives  Does Patient Have a Medical Advance Directive? No  Would patient like information on creating a medical advance directive? No - Patient declined   Advance Care Planning is important because it: Ensures you receive medical care that aligns with your values, goals, and preferences. Provides guidance to your family and  loved ones, reducing the emotional burden of decision-making during critical moments.  Vision: Annual vision screenings are recommended for early detection of glaucoma, cataracts, and diabetic retinopathy. These exams can also reveal signs of chronic conditions such as diabetes and high blood pressure.  Dental: Annual dental screenings help detect early signs of oral cancer, gum disease, and other conditions linked to overall health, including heart disease and diabetes.  Please see the attached documents for additional preventive care recommendations.

## 2023-12-07 ENCOUNTER — Ambulatory Visit: Admitting: Family Medicine

## 2023-12-15 ENCOUNTER — Encounter: Payer: Self-pay | Admitting: Family Medicine

## 2023-12-15 ENCOUNTER — Ambulatory Visit (INDEPENDENT_AMBULATORY_CARE_PROVIDER_SITE_OTHER): Admitting: Family Medicine

## 2023-12-15 VITALS — BP 130/78 | HR 74 | Temp 98.3°F | Ht 71.0 in | Wt 275.6 lb

## 2023-12-15 DIAGNOSIS — Z6838 Body mass index (BMI) 38.0-38.9, adult: Secondary | ICD-10-CM | POA: Diagnosis not present

## 2023-12-15 DIAGNOSIS — R7303 Prediabetes: Secondary | ICD-10-CM | POA: Diagnosis not present

## 2023-12-15 DIAGNOSIS — I10 Essential (primary) hypertension: Secondary | ICD-10-CM | POA: Diagnosis not present

## 2023-12-15 DIAGNOSIS — Z23 Encounter for immunization: Secondary | ICD-10-CM | POA: Diagnosis not present

## 2023-12-15 DIAGNOSIS — E66812 Obesity, class 2: Secondary | ICD-10-CM | POA: Diagnosis not present

## 2023-12-15 NOTE — Assessment & Plan Note (Signed)
 I will recheck a blood sugar today. Recommend weight loss to prevent progression.

## 2023-12-15 NOTE — Assessment & Plan Note (Addendum)
 I recommend Jordan Hines reconnect with [facility] to continue his efforts at weight loss. He expresses a desire to re-engage. I asked that he complete the paperwork he was given by the clinic and call for a follow-up appointment.

## 2023-12-15 NOTE — Progress Notes (Signed)
 [facility] [address] [place] [address] Dept: [phone] Dept Fax: [phone]  Chronic Care Office Visit  Subjective:    Patient ID: Jordan Hines, male    DOB: Jul 01, 1950, 73 y.o..   MRN: [id number]  Chief Complaint  Patient presents with   Hypertension    3 month f/u HTN.   Average BP 130-135/70-80.   Wants referral to ENT.    History of Present Illness:  Patient is in today for reassessment of chronic medical conditions.   Mr. Pry has a history of hypertension. He is managed on lisinopril  10 mg daily and metoprolol  25 mg daily.  At his last appointment, I referred him to [facility]. he did attend an initial visit. He notes that after this, he went to [place], [place] for 3 months, so has not had ongoing visits. While in [place] he tried to be engaged in more walking. However, since returning home this has fallen off. He continues to be pretty inactive, spending a fair amount of his day sitting in his recliner watching sports. He admits he had trouble considering that eating more might help him lose weight, as he feels like he doesn't eat very much currently and still hasn't lost weight.  Past Medical History: Patient Active Problem List   Diagnosis Date Noted   Prediabetes 09/04/2023   Depression 10/20/2022   Nasal polyps 08/14/2021   Erectile dysfunction 03/22/2021   History of prostate cancer 03/22/2021   Tobacco use disorder, continuous 03/22/2021   Essential hypertension 12/19/2020   Obesity (BMI 35.0-39.9 without comorbidity) 12/19/2020   Acute gouty arthritis 02/26/2018   Non-allergic rhinitis 01/06/2018   Past Surgical History:  Procedure Laterality Date   COLONOSCOPY  2000   Dr.Orr normal per pt-no report    INGUINAL HERNIA REPAIR Right    NASAL SEPTOPLASTY W/ TURBINOPLASTY Bilateral 10/19/2018   Procedure: NASAL SEPTOPLASTY WITH  BILATERAL TURBINATE REDUCTION;  Surgeon:  Karis Clunes, MD;  Location: [facility];  Service: ENT;  Laterality: Bilateral;   PROSTATE SURGERY     Family History  Problem Relation Age of Onset   Kidney disease Mother    Heart disease Mother    Asthma Mother    Cancer Father        Non-Hodgkins lymphoma   Diabetes Father    Cancer Sister        Bladder   Cancer Brother        Stomach   Colon polyps Brother    Heart disease Maternal Grandmother    Heart disease Paternal Grandmother    Diabetes Nephew    Diabetes Niece    Allergic rhinitis Neg Hx    Eczema Neg Hx    Urticaria Neg Hx    Colon cancer Neg Hx    Esophageal cancer Neg Hx    Rectal cancer Neg Hx    Stomach cancer Neg Hx    Outpatient Medications Prior to Visit  Medication Sig Dispense Refill   allopurinol  (ZYLOPRIM ) 100 MG tablet TAKE 1 TABLET EVERY DAY 90 tablet 3   azelastine  (ASTELIN ) 0.1 % nasal spray Place 1 spray into both nostrils 2 (two) times daily. Use in each nostril as directed 30 mL 12   Fluticasone  Propionate (XHANCE ) 93 MCG/ACT EXHU Place 2 sprays into the nose 2 (two) times daily. 16 mL 5   ipratropium (ATROVENT ) 0.06 % nasal spray USE 1 SPRAY IN EACH NOSTRIL THREE TIMES DAILY 60  mL 3   lisinopril  (ZESTRIL ) 10 MG tablet TAKE 1 TABLET EVERY DAY 90 tablet 3   metoprolol  succinate (TOPROL -XL) 25 MG 24 hr tablet TAKE 1 TABLET EVERY DAY 90 tablet 3   sertraline  (ZOLOFT ) 100 MG tablet TAKE 1 TABLET EVERY DAY 90 tablet 3   sildenafil (VIAGRA) 100 MG tablet Take 100 mg by mouth daily.     No facility-administered medications prior to visit.   No Known Allergies   Objective:   Today's Vitals   12/15/23 1511  BP: 130/78  Pulse: 74  Temp: 98.3 F (36.8 C)  TempSrc: Temporal  SpO2: 98%  Weight: 275 lb 9.6 oz (125 kg)  Height: 5' 11 (1.803 m)   Body mass index is 38.44 kg/m.   General: Well developed, well nourished. No acute distress. Psych: Alert and oriented. Normal mood and affect.  Health Maintenance Due  Topic Date Due    Zoster Vaccines- Shingrix (1 of 2) Never done   DTaP/Tdap/Td (2 - Td or Tdap) 08/22/2018   Influenza Vaccine  09/11/2023   COVID-19 Vaccine (5 - 2025-26 season) 10/12/2023     Assessment & Plan:   Problem List Items Addressed This Visit       Cardiovascular and Mediastinum   Essential hypertension - Primary   Blood pressure is in good control. Continue lisinopril  10 mg daily and metoprolol  succinate 25 mg daily. I encourage him again to do some walking every day for exercise. He admits that he knows this is needed, but has difficulty with motivation.      Relevant Orders   Microalbumin / creatinine urine ratio   Basic metabolic panel with GFR     Other   Class 2 obesity due to excess calories with body mass index (BMI) of 38.0 to 38.9 in adult   I recommend Mr. Bain reconnect with [facility] to continue his efforts at weight loss. He expresses a desire to re-engage. I asked that he complete the paperwork he was given by the clinic and call for a follow-up appointment.      Prediabetes   I will recheck a blood sugar today. Recommend weight loss to prevent progression.      Other Visit Diagnoses       Need for immunization against influenza       Relevant Orders   Flu vaccine HIGH DOSE PF(Fluzone Trivalent) (Completed)      Return in about 3 months (around 03/16/2024) for Reassessment.   Garnette CHRISTELLA Simpler, MD  I,Emily Lagle,acting as a scribe for Garnette CHRISTELLA Simpler, MD.,have documented all relevant documentation on the behalf of Garnette CHRISTELLA Simpler, MD.  I, Garnette CHRISTELLA Simpler, MD, have reviewed all documentation for this visit. The documentation on 12/15/2023 for the exam, diagnosis, procedures, and orders are all accurate and complete.

## 2023-12-15 NOTE — Assessment & Plan Note (Addendum)
 Blood pressure is in good control. Continue lisinopril  10 mg daily and metoprolol  succinate 25 mg daily. I encourage him again to do some walking every day for exercise. He admits that he knows this is needed, but has difficulty with motivation.

## 2023-12-16 ENCOUNTER — Ambulatory Visit: Payer: Self-pay | Admitting: Family Medicine

## 2023-12-16 LAB — MICROALBUMIN / CREATININE URINE RATIO
Creatinine,U: 105.2 mg/dL
Microalb Creat Ratio: 11.5 mg/g (ref 0.0–30.0)
Microalb, Ur: 1.2 mg/dL (ref 0.0–1.9)

## 2023-12-16 LAB — BASIC METABOLIC PANEL WITH GFR
BUN: 17 mg/dL (ref 6–23)
CO2: 29 meq/L (ref 19–32)
Calcium: 9 mg/dL (ref 8.4–10.5)
Chloride: 102 meq/L (ref 96–112)
Creatinine, Ser: 1.08 mg/dL (ref 0.40–1.50)
GFR: 67.4 mL/min (ref >60.00)
Glucose, Bld: 76 mg/dL (ref 70–99)
Potassium: 4.1 meq/L (ref 3.5–5.1)
Sodium: 137 meq/L (ref 135–145)

## 2024-02-10 ENCOUNTER — Other Ambulatory Visit: Payer: Self-pay | Admitting: Family Medicine

## 2024-02-10 DIAGNOSIS — I10 Essential (primary) hypertension: Secondary | ICD-10-CM

## 2024-02-10 DIAGNOSIS — M109 Gout, unspecified: Secondary | ICD-10-CM

## 2024-03-16 ENCOUNTER — Ambulatory Visit: Admitting: Family Medicine

## 2024-03-16 ENCOUNTER — Encounter: Payer: Self-pay | Admitting: Family Medicine

## 2024-03-16 VITALS — BP 134/78 | HR 78 | Temp 98.2°F | Ht 71.0 in | Wt 277.6 lb

## 2024-03-16 DIAGNOSIS — N529 Male erectile dysfunction, unspecified: Secondary | ICD-10-CM

## 2024-03-16 DIAGNOSIS — J310 Chronic rhinitis: Secondary | ICD-10-CM

## 2024-03-16 DIAGNOSIS — I10 Essential (primary) hypertension: Secondary | ICD-10-CM

## 2024-03-16 MED ORDER — SILDENAFIL CITRATE 100 MG PO TABS: 100.0000 mg | ORAL_TABLET | Freq: Every day | ORAL | 11 refills | Status: AC

## 2024-03-16 NOTE — Assessment & Plan Note (Signed)
 I recommend Jordan Hines reconnect with [facility] to continue his efforts at weight loss. He expresses a desire to re-engage. I asked that he complete the paperwork he was given by the clinic and call for a follow-up appointment.

## 2024-03-16 NOTE — Assessment & Plan Note (Signed)
 Jordan Hines has had standard approaches to managing with chronic rhinitis. I recommend he continue his azelastine , ipratropium, and fluticasone  nasal sprays. He should continue nasal saline washes. I feel at this point he shodul follow-up with ENT to discuss if any further surgical options may help.

## 2024-03-16 NOTE — Progress Notes (Signed)
 " [facility] [address] [place] [address] Dept: [phone] Dept Fax: [phone]  Chronic Care Office Visit  Subjective:    Patient ID: Jordan Hines, male    DOB: [birth year], 74 y.o..   MRN: [id number]  Chief Complaint  Patient presents with   Hypertension    3 month f/u HTN. C/o still having runny nose and has been using the nasal spray   History of Present Illness:  Patient is in today for reassessment of chronic medical conditions.   Mr. Levingston has a history of hypertension. He is managed on lisinopril  10 mg daily and metoprolol  25 mg daily.    Mr. Pandit has a history of chronic, nonallergic rhinitis. He has had prior nasal surgery for opening up his nose. He has been managed on azelastine , fluticasone , and ipratropium nasal sprays. He has been seen by both ENT and an allergist. He uses a Navage unit to rinse his sinuses. Despite this, he notes he continues to have nasal congestion and rhinorrhea. He finds that at night he has difficulty breathing through his nose and this contributes to some degree of fatigue.  Past Medical History: Patient Active Problem List   Diagnosis Date Noted   Prediabetes 09/04/2023   Depression 10/20/2022   Nasal polyps 08/14/2021   Erectile dysfunction 03/22/2021   History of prostate cancer 03/22/2021   Tobacco use disorder, continuous 03/22/2021   Essential hypertension 12/19/2020   Class 2 obesity due to excess calories with body mass index (BMI) of 38.0 to 38.9 in adult 12/19/2020   Acute gouty arthritis 02/26/2018   Non-allergic rhinitis 01/06/2018   Past Surgical History:  Procedure Laterality Date   COLONOSCOPY  2000   Dr.Orr normal per pt-no report    INGUINAL HERNIA REPAIR Right    NASAL SEPTOPLASTY W/ TURBINOPLASTY Bilateral 10/19/2018   Procedure: NASAL SEPTOPLASTY WITH  BILATERAL TURBINATE REDUCTION;  Surgeon: Karis Clunes, MD;  Location: [facility];   Service: ENT;  Laterality: Bilateral;   PROSTATE SURGERY     Family History  Problem Relation Age of Onset   Kidney disease Mother    Heart disease Mother    Asthma Mother    Cancer Father        Non-Hodgkins lymphoma   Diabetes Father    Cancer Sister        Bladder   Cancer Brother        Stomach   Colon polyps Brother    Heart disease Maternal Grandmother    Heart disease Paternal Grandmother    Diabetes Nephew    Diabetes Niece    Allergic rhinitis Neg Hx    Eczema Neg Hx    Urticaria Neg Hx    Colon cancer Neg Hx    Esophageal cancer Neg Hx    Rectal cancer Neg Hx    Stomach cancer Neg Hx    Outpatient Medications Prior to Visit  Medication Sig Dispense Refill   allopurinol  (ZYLOPRIM ) 100 MG tablet TAKE 1 TABLET EVERY DAY 90 tablet 3   azelastine  (ASTELIN ) 0.1 % nasal spray Place 1 spray into both nostrils 2 (two) times daily. Use in each nostril as directed (Patient not taking: Reported on 12/15/2023) 30 mL 12   Fluticasone  Propionate (XHANCE ) 93 MCG/ACT EXHU Place 2 sprays into the nose 2 (two) times daily. 16 mL 5   ipratropium (ATROVENT ) 0.06 % nasal spray USE 1 SPRAY IN EACH NOSTRIL THREE TIMES DAILY 60 mL 3  lisinopril  (ZESTRIL ) 10 MG tablet TAKE 1 TABLET EVERY DAY 90 tablet 3   metoprolol  succinate (TOPROL -XL) 25 MG 24 hr tablet TAKE 1 TABLET EVERY DAY 90 tablet 3   sertraline  (ZOLOFT ) 100 MG tablet TAKE 1 TABLET EVERY DAY 90 tablet 3   sildenafil  (VIAGRA ) 100 MG tablet Take 100 mg by mouth daily.     No facility-administered medications prior to visit.   Allergies[1]   Objective:   Today's Vitals   03/16/24 1352  BP: 134/78  Pulse: 78  Temp: 98.2 F (36.8 C)  TempSrc: Temporal  SpO2: 98%  Weight: 277 lb 9.6 oz (125.9 kg)  Height: 5' 11 (1.803 m)   Body mass index is 38.72 kg/m.   General: Well developed, well nourished. No acute distress. Psych: Alert and oriented. Normal mood and affect.  Health Maintenance Due  Topic Date Due   Zoster  Vaccines- Shingrix (1 of 2) Never done   DTaP/Tdap/Td (2 - Td or Tdap) 08/22/2018   COVID-19 Vaccine (5 - 2025-26 season) 10/12/2023   Assessment & Plan:   Problem List Items Addressed This Visit       Cardiovascular and Mediastinum   Essential hypertension - Primary   Blood pressure is in adequate control. Continue lisinopril  10 mg daily and metoprolol  succinate 25 mg daily. Continue to encourage regular exercise.      Relevant Medications   sildenafil  (VIAGRA ) 100 MG tablet     Respiratory   Non-allergic rhinitis   Mr. Blethen has had standard approaches to managing with chronic rhinitis. I recommend he continue his azelastine , ipratropium, and fluticasone  nasal sprays. He should continue nasal saline washes. I feel at this point he shodul follow-up with ENT to discuss if any further surgical options may help.        Other   Erectile dysfunction   Responding well to use of intermittent sildenafil . I will renew his prescription.      Relevant Medications   sildenafil  (VIAGRA ) 100 MG tablet    Return in about 3 months (around 06/13/2024) for Reassessment.   Garnette CHRISTELLA Simpler, MD  I,Emily Lagle,acting as a scribe for Garnette CHRISTELLA Simpler, MD.,have documented all relevant documentation on the behalf of Garnette CHRISTELLA Simpler, MD.  I, Garnette CHRISTELLA Simpler, MD, have reviewed all documentation for this visit. The documentation on 03/16/2024 for the exam, diagnosis, procedures, and orders are all accurate and complete.      [1] No Known Allergies  "

## 2024-03-16 NOTE — Assessment & Plan Note (Signed)
 Responding well to use of intermittent sildenafil . I will renew his prescription.

## 2024-03-16 NOTE — Assessment & Plan Note (Signed)
 I will recheck a blood sugar today. Recommend weight loss to prevent progression.

## 2024-03-16 NOTE — Assessment & Plan Note (Addendum)
 Blood pressure is in adequate control. Continue lisinopril  10 mg daily and metoprolol  succinate 25 mg daily. Continue to encourage regular exercise.

## 2024-06-13 ENCOUNTER — Ambulatory Visit: Admitting: Family Medicine

## 2024-11-18 ENCOUNTER — Ambulatory Visit
